# Patient Record
Sex: Female | Born: 1980 | Race: Black or African American | Hispanic: No | Marital: Single | Smoking: Current every day smoker
Health system: Southern US, Community
[De-identification: ages and names within clinical notes are randomized; demographics above are authoritative.]

## PROBLEM LIST (undated history)

## (undated) DIAGNOSIS — O24419 Gestational diabetes mellitus in pregnancy, unspecified control: Secondary | ICD-10-CM

## (undated) DIAGNOSIS — G039 Meningitis, unspecified: Secondary | ICD-10-CM

## (undated) DIAGNOSIS — E669 Obesity, unspecified: Secondary | ICD-10-CM

## (undated) DIAGNOSIS — G43909 Migraine, unspecified, not intractable, without status migrainosus: Secondary | ICD-10-CM

## (undated) DIAGNOSIS — I10 Essential (primary) hypertension: Secondary | ICD-10-CM

## (undated) HISTORY — PX: NO PAST SURGERIES: SHX2092

## (undated) HISTORY — DX: Migraine, unspecified, not intractable, without status migrainosus: G43.909

## (undated) HISTORY — DX: Obesity, unspecified: E66.9

---

## 2001-07-29 ENCOUNTER — Emergency Department (HOSPITAL_COMMUNITY): Admission: EM | Admit: 2001-07-29 | Discharge: 2001-07-29 | Payer: Self-pay

## 2003-05-14 ENCOUNTER — Ambulatory Visit (HOSPITAL_COMMUNITY): Admission: RE | Admit: 2003-05-14 | Discharge: 2003-05-14 | Payer: Self-pay | Admitting: Pulmonary Disease

## 2003-05-28 ENCOUNTER — Emergency Department (HOSPITAL_COMMUNITY): Admission: EM | Admit: 2003-05-28 | Discharge: 2003-05-28 | Payer: Self-pay | Admitting: Emergency Medicine

## 2003-05-31 ENCOUNTER — Inpatient Hospital Stay (HOSPITAL_COMMUNITY): Admission: AD | Admit: 2003-05-31 | Discharge: 2003-06-05 | Payer: Self-pay | Admitting: *Deleted

## 2003-10-28 ENCOUNTER — Emergency Department (HOSPITAL_COMMUNITY): Admission: EM | Admit: 2003-10-28 | Discharge: 2003-10-28 | Payer: Self-pay | Admitting: Family Medicine

## 2004-05-26 ENCOUNTER — Emergency Department (HOSPITAL_COMMUNITY): Admission: EM | Admit: 2004-05-26 | Discharge: 2004-05-26 | Payer: Self-pay | Admitting: Family Medicine

## 2004-09-19 ENCOUNTER — Emergency Department (HOSPITAL_COMMUNITY): Admission: EM | Admit: 2004-09-19 | Discharge: 2004-09-20 | Payer: Self-pay | Admitting: Emergency Medicine

## 2004-09-20 ENCOUNTER — Emergency Department (HOSPITAL_COMMUNITY): Admission: EM | Admit: 2004-09-20 | Discharge: 2004-09-20 | Payer: Self-pay | Admitting: Emergency Medicine

## 2004-10-04 ENCOUNTER — Emergency Department (HOSPITAL_COMMUNITY): Admission: EM | Admit: 2004-10-04 | Discharge: 2004-10-04 | Payer: Self-pay | Admitting: Emergency Medicine

## 2005-02-01 ENCOUNTER — Ambulatory Visit (HOSPITAL_COMMUNITY): Admission: RE | Admit: 2005-02-01 | Discharge: 2005-02-01 | Payer: Self-pay | Admitting: *Deleted

## 2005-03-08 ENCOUNTER — Inpatient Hospital Stay (HOSPITAL_COMMUNITY): Admission: AD | Admit: 2005-03-08 | Discharge: 2005-03-08 | Payer: Self-pay | Admitting: *Deleted

## 2005-03-08 ENCOUNTER — Ambulatory Visit: Payer: Self-pay | Admitting: Family Medicine

## 2005-03-19 ENCOUNTER — Inpatient Hospital Stay (HOSPITAL_COMMUNITY): Admission: AD | Admit: 2005-03-19 | Discharge: 2005-03-20 | Payer: Self-pay | Admitting: *Deleted

## 2005-03-19 ENCOUNTER — Ambulatory Visit: Payer: Self-pay | Admitting: Certified Nurse Midwife

## 2005-06-05 ENCOUNTER — Emergency Department (HOSPITAL_COMMUNITY): Admission: EM | Admit: 2005-06-05 | Discharge: 2005-06-05 | Payer: Self-pay | Admitting: Emergency Medicine

## 2005-06-26 ENCOUNTER — Ambulatory Visit (HOSPITAL_COMMUNITY): Admission: RE | Admit: 2005-06-26 | Discharge: 2005-06-26 | Payer: Self-pay | Admitting: Obstetrics and Gynecology

## 2005-07-09 ENCOUNTER — Inpatient Hospital Stay (HOSPITAL_COMMUNITY): Admission: AD | Admit: 2005-07-09 | Discharge: 2005-07-11 | Payer: Self-pay | Admitting: Obstetrics & Gynecology

## 2005-07-09 ENCOUNTER — Ambulatory Visit: Payer: Self-pay | Admitting: Certified Nurse Midwife

## 2005-09-16 ENCOUNTER — Inpatient Hospital Stay (HOSPITAL_COMMUNITY): Admission: EM | Admit: 2005-09-16 | Discharge: 2005-09-22 | Payer: Self-pay | Admitting: Emergency Medicine

## 2005-09-21 ENCOUNTER — Ambulatory Visit: Payer: Self-pay | Admitting: Infectious Diseases

## 2006-11-04 ENCOUNTER — Emergency Department (HOSPITAL_COMMUNITY): Admission: EM | Admit: 2006-11-04 | Discharge: 2006-11-04 | Payer: Self-pay | Admitting: Emergency Medicine

## 2006-11-20 ENCOUNTER — Emergency Department (HOSPITAL_COMMUNITY): Admission: EM | Admit: 2006-11-20 | Discharge: 2006-11-20 | Payer: Self-pay | Admitting: Emergency Medicine

## 2007-01-07 ENCOUNTER — Emergency Department (HOSPITAL_COMMUNITY): Admission: EM | Admit: 2007-01-07 | Discharge: 2007-01-07 | Payer: Self-pay | Admitting: Emergency Medicine

## 2007-08-20 ENCOUNTER — Emergency Department (HOSPITAL_COMMUNITY): Admission: EM | Admit: 2007-08-20 | Discharge: 2007-08-20 | Payer: Self-pay | Admitting: Emergency Medicine

## 2008-05-02 ENCOUNTER — Emergency Department (HOSPITAL_COMMUNITY): Admission: EM | Admit: 2008-05-02 | Discharge: 2008-05-02 | Payer: Self-pay | Admitting: Emergency Medicine

## 2009-07-16 ENCOUNTER — Emergency Department (HOSPITAL_COMMUNITY): Admission: EM | Admit: 2009-07-16 | Discharge: 2009-07-16 | Payer: Self-pay | Admitting: Emergency Medicine

## 2009-12-29 ENCOUNTER — Emergency Department (HOSPITAL_COMMUNITY)
Admission: EM | Admit: 2009-12-29 | Discharge: 2009-12-29 | Payer: Self-pay | Source: Home / Self Care | Admitting: Emergency Medicine

## 2010-07-06 LAB — URINALYSIS, ROUTINE W REFLEX MICROSCOPIC
Bilirubin Urine: NEGATIVE
Glucose, UA: NEGATIVE mg/dL
Hgb urine dipstick: NEGATIVE
Ketones, ur: NEGATIVE mg/dL
Nitrite: NEGATIVE
Protein, ur: NEGATIVE mg/dL
Specific Gravity, Urine: 1.023 (ref 1.005–1.030)
Urobilinogen, UA: 1 mg/dL (ref 0.0–1.0)
pH: 8.5 — ABNORMAL HIGH (ref 5.0–8.0)

## 2010-07-06 LAB — BASIC METABOLIC PANEL
BUN: 3 mg/dL — ABNORMAL LOW (ref 6–23)
CO2: 26 mEq/L (ref 19–32)
Calcium: 8.6 mg/dL (ref 8.4–10.5)
Chloride: 108 mEq/L (ref 96–112)
Creatinine, Ser: 0.59 mg/dL (ref 0.4–1.2)
GFR calc Af Amer: >60 mL/min (ref >60)
GFR calc non Af Amer: >60 mL/min (ref >60)
Glucose, Bld: 93 mg/dL (ref 70–99)
Potassium: 3.7 mEq/L (ref 3.5–5.1)
Sodium: 137 mEq/L (ref 135–145)

## 2010-07-06 LAB — CBC
HCT: 32.2 % — ABNORMAL LOW (ref 36.0–46.0)
Hemoglobin: 10.4 g/dL — ABNORMAL LOW (ref 12.0–15.0)
MCHC: 32.4 g/dL (ref 30.0–36.0)
MCV: 78.4 fL (ref 78.0–100.0)
Platelets: 329 10*3/uL (ref 150–400)
RBC: 4.1 MIL/uL (ref 3.87–5.11)
RDW: 17 % — ABNORMAL HIGH (ref 11.5–15.5)
WBC: 5.8 10*3/uL (ref 4.0–10.5)

## 2010-07-06 LAB — DIFFERENTIAL
Basophils Absolute: 0 10*3/uL (ref 0.0–0.1)
Basophils Relative: 1 % (ref 0–1)
Eosinophils Absolute: 0.1 10*3/uL (ref 0.0–0.7)
Eosinophils Relative: 2 % (ref 0–5)
Lymphocytes Relative: 30 % (ref 12–46)
Lymphs Abs: 1.7 10*3/uL (ref 0.7–4.0)
Monocytes Absolute: 0.5 10*3/uL (ref 0.1–1.0)
Monocytes Relative: 8 % (ref 3–12)
Neutro Abs: 3.4 10*3/uL (ref 1.7–7.7)
Neutrophils Relative %: 59 % (ref 43–77)

## 2010-07-06 LAB — GC/CHLAMYDIA PROBE AMP, GENITAL
Chlamydia, DNA Probe: NEGATIVE
GC Probe Amp, Genital: NEGATIVE

## 2010-07-06 LAB — WET PREP, GENITAL
Clue Cells Wet Prep HPF POC: NONE SEEN
Trich, Wet Prep: NONE SEEN
Yeast Wet Prep HPF POC: NONE SEEN

## 2010-07-06 LAB — URINE MICROSCOPIC-ADD ON

## 2010-07-06 LAB — PREGNANCY, URINE: Preg Test, Ur: NEGATIVE

## 2010-09-02 NOTE — Discharge Summary (Signed)
NAME:  Sandra Mcknight, Sandra Mcknight             ACCOUNT NO.:  [id number]   MEDICAL RECORD NO.:  [id number]          PATIENT TYPE:  INP   LOCATION:  5017                         FACILITY:  [facility]   PHYSICIAN:  Mina Marble, M.D.DATE OF BIRTH:  [birth year]   DATE OF ADMISSION:  09/16/2005  DATE OF DISCHARGE:  09/22/2005                                 DISCHARGE SUMMARY   DISCHARGE DIAGNOSES:  1.  Herpes simples acute meningitis, treated and improved.  2.  Constipation, nonspecific, improved.  3.  Acute gastritis without hemorrhage with episodes of vomiting, treated,      improved.  4.  Headache, nonspecific, thought to be related to her meningitis.   HISTORY OF PRESENT ILLNESS:  The patient is a 30 year old African-American  female who was admitted from the [facility] emergency department with  episodes of fever with a temperature up to 102 and severe headache and was  found on spinal tap to have an abnormal spinal tap with increased WBCs.  The  patient was admitted for further evaluation and started on Rocephin pending  the results of her cultures.   PAST HISTORY:  Reveals that the patient also had an episode of meningitis of  unclear etiology, possibly one year ago, and was thought to be viral at that  time.  The patient did have blood studies on this occasion to see if her  herpes simplex titer was elevated in which her herpes simplex one and two  titers were elevated.   PHYSICAL EXAMINATION:  VITAL SIGNS: Temperature in the ER of 102, but by the  time of admission was 100.3. Pulse 102.  Respirations 18.  Blood pressure  133/85.  HEENT:  Pupils equal, round and reactive to light.  LUNGS: Clear to auscultation.  HEART: Regular rhythm with no rubs.  ABDOMEN: Slightly obese.  EXTREMITIES: Without edema and good movement.   HOSPITAL COURSE:  The patient's hospital course was one of gradual  improvement.  Her cultures were negative, but the blood studies revealed an  infection with  herpes simplex virus.  An infectious disease consultation was  obtained as to recommendation for the treatment of this infection.  They  recommended that she receive initially IV Acyclovir and after her headache  improved, she was discharged home on Valtrex  1 g p.o. q.i.d. x10 more days  and to have a followup in the office.  The patient was discharged on a  regular diet and Valtrex 1 g p.o. q.i.d. x10 more days.  She was able to  resume her usual activities.      Mina Marble, M.D.  Electronically Signed     GRK/MEDQ  D:  10/17/2005  T:  10/17/2005  Job:  [id number]

## 2010-09-02 NOTE — Discharge Summary (Signed)
NAME:  Sandra Mcknight, Sandra Mcknight                       ACCOUNT NO.:  [id number]   MEDICAL RECORD NO.:  [id number]                   PATIENT TYPE:  INP   LOCATION:  5011                                 FACILITY:  [facility]   PHYSICIAN:  Eino Farber., M.D.      DATE OF BIRTH:  09-15-1980   DATE OF ADMISSION:  05/31/2003  DATE OF DISCHARGE:  06/05/2003                                 DISCHARGE SUMMARY   DISCHARGE DIAGNOSES:  1. Acute viral meningitis, treated by close observation and Rocephin for     possible secondary infection.  2. Myalgias and myositis.  3. Migraine headache, frequent.  4. Acute gastritis without hemorrhage.  5. Constipation, treated and improved.  6. Depression, mild.   BRIEF HISTORY AND PHYSICAL:  The patient was a 30 year old black female who  developed moderately severe headache for about two days prior to admission  and then developed a fever on the day of admission, with stiffness of the  neck and was evaluated at the [facility] Emergency  Department and admitted after finding an elevation in her cerebrospinal  fluid.  The patient relates that she frequently has headaches, but not the  stiffness of the neck.  The patient's cerebrospinal fluid also had some  increased WBCs, but they were lymphocytic.  The patient was started on  Rocephin in the Emergency Department, pending cultures from the  cerebrospinal fluid.   SOCIAL HISTORY:  The patient denies a history of illegal drug use.   FAMILY HISTORY:  Noncontributory.   REVIEW OF SYSTEMS:  Review of systems reveals the head, eyes, ears, nose and  throat, cardiopulmonary, neuromuscular system and GI system, as noted, with  frequent episodes of headache.   ALLERGIES:  The patient has no known drug allergies.   PHYSICAL EXAMINATION:  PERTINENT PHYSICAL FINDINGS 05/31/2003:  Admission  05/31/2003, discharged 06/05/2003.  GENERAL:  The patient was observed in the hospital to be a  well-developed,  well-nourished black female in no acute distress, but complaining of  headache and neck stiffness.  The patient was concerned that she may have  possible rapid neurologic changes, since her headache was worse, and she  felt also weak.  VITAL SIGNS:  Vital signs on admission revealed a temperature of 99.0, pulse  83, respirations 20, and a blood pressure of 101/47.  HEENT:  Examination of the head, eyes, ears, nose and throat revealed her  pupils to be equal, round and reactive to light.  She was alert and coherent  in her conversation.  NECK:  Her neck revealed no masses or thyroid  enlargement.  There was mild  stiffness of the neck with flexion.  LUNGS:  Lungs were clear to auscultation and percussion.  HEART:  The heart had a regular rhythm with no murmurs.  ABDOMEN:  Soft with mild obesity and no abrasions or any organomegaly on  examination of the abdomen.  GU:  Genitalia revealed normal external female genitalia.  EXTREMITIES:  The extremities revealed movement bilaterally and no edema.   HOSPITAL COURSE:  The patient's hospital course was one of gradual  improvement.  She was continued on her Rocephin 2 grams IV daily pending the  results of the cerebrospinal fluid cultures.   Laboratory data, with a CBC on 06/02/2003, revealed a hemoglobin of 11.4 and  indicated slight anemia and a WBC count of 4,200, with a platelet count of  301,000.   The patient continued to improve with headache improved.   DISCHARGE MEDICATIONS:  1. Effexor-XR 150 mg caps one daily which appears to be helping her     headaches, which probably also has a tension component.  2. Darvocet-N 100 one q.4h. p.r.n. pain or headache.  3. Stool softener of choice.  Senokot  as needed, to be continued if     desired.  4. Phenergan 25 mg tabs before meals, as needed for nausea.   DISCHARGE INSTRUCTIONS:  The patient is to ambulate as tolerated.  She is to  continue on a regular diet.  She is  to have a followup evaluation in Dr.  Consuello Bossier office in April 2005.   The patient was seen in consultation by the neurologist, C. Lesia Sago,  M.D., who felt that the patient had viral meningitis that was improving or  aseptic meningitis and chronic headaches, which could be followed as an  outpatient, since her spinal fluid cultures were negative.                                                Eino Farber., M.D.    GRK/MEDQ  D:  07/23/2003  T:  07/24/2003  Job:  [id number]   cc:   Marlan Palau, M.D.  [address]  [address]  [place]  [place] [address]  Fax: [phone]

## 2010-09-02 NOTE — Consult Note (Signed)
NAME:  Sandra Mcknight, Sandra Mcknight                       ACCOUNT NO.:  [id number]   MEDICAL RECORD NO.:  [id number]                   PATIENT TYPE:  INP   LOCATION:  5011                                 FACILITY:  [facility]   PHYSICIAN:  Marlan Palau, M.D.               DATE OF BIRTH:  21-Aug-1980   DATE OF CONSULTATION:  06/02/2002  DATE OF DISCHARGE:                                   CONSULTATION   REFERRING PHYSICIAN:  Dr. Mina Marble.   HISTORY OF PRESENT ILLNESS:  Sandra Mcknight is a 30 year old black  female, born 02/16/1981, with a history of chronic daily headaches  over the last 5 years.  The patient comes into [facility] at this point with  a severe headache, more so than usual, fevers, a stiff neck.  The patient  underwent a lumbar puncture and has evidence of an aseptic meningitis with  greater than 900 white cells, protein in the 80s, normal glucose.  The  patient's cells were almost completely lymphocytes.  Actual CSF protein was  69, glucose 51.  The patient had 950 white cells, 2 red cells.  The patient  has had a CAT scan of the brain that is unremarkable.  The patient reports  symptoms of fever a week prior to this consult evaluation.  The patient has  had some nausea, vomiting; denies diarrhea, skin rash; notes some pain with  cutting the eyes side-to-side.  The patient again does note meningismus.  The patient has been treated with Rocephin IV and is feeling much better at  this point.   PAST MEDICAL HISTORY:  Past medical history is significant for:  1. History of aseptic meningitis.  2. Obesity.  3. Chronic daily headaches.   MEDICATIONS:  The patient has taken some Excedrin for headache prior to the  admission.   ALLERGIES:  Has no known allergies.   HABITS:  Does not smoke or drink.   SOCIAL HISTORY:  The patient lives with her mother, has a 62-year-old  daughter, does not work.   FAMILY MEDICAL HISTORY:  Family medical history was not  obtained.   REVIEW OF SYSTEMS:  Review of systems notable for some fevers, as above,  neck stiffness; denies double vision; denies shortness of breath, chest  pains; has had some nausea; denies any problems controlling the  bowels or  bladder, numbness to face, arms or legs, or blackout episodes.   PHYSICAL EXAMINATION:  VITALS:  Blood pressure is 115/45, heart rate is 83,  respiratory rate 18, temperature 99.5.  GENERAL:  This patient is a moderately obese black female who is alert and  cooperative at time of examination.  HEENT:  Head is atraumatic.  Eyes:  Pupils are equal, round, react to light.  Disks are flat.  No venous pulsations are seen.  NECK:  Neck is relatively supple, no carotid bruits noted.  RESPIRATORY:  Examination is clear.  CARDIOVASCULAR:  Examination reveals a regular rate and rhythm, no obvious  murmurs or rubs noted.  EXTREMITIES:  Extremities are without significant edema.  NEUROLOGIC:  Cranial nerves as above.  Facial symmetry is present.  The  patient has good sensation of the face to pinprick and soft touch  bilaterally, has good speech, no aphasia seen.  Again, extraocular movements  are full.  Visual fields are full.  Motor testing reveals 5/5 strength in  all fours.  Good symmetric motor tone is noted throughout.  Sensory testing  is intact to pinprick, soft touch and vibratory sensation throughout.  The  patient has fair finger-to-nose-to-finger and heel-to-shin.  Gait was not  tested.  Deep tendon reflexes are symmetric and normal.  Toes are neutral  bilaterally.   LABORATORY VALUES:  Laboratory values notable for spinal fluid results as  above.  White count is normal.   CT of the head is unremarkable.   IMPRESSION:  1. Aseptic meningitis, improving.  2. Chronic daily headaches.   This patient has suffered from severe chronic daily headaches for a number  of years.  I will be happy to follow this patient as an outpatient following  discharge.  I  will not initiate treatment until meningitis clears.  The  patient seems to be doing a bit better at this time.  Will follow patient  while in house.   Thank you very much.                                               Marlan Palau, M.D.    CKW/MEDQ  D:  06/03/2003  T:  06/04/2003  Job:  [id number]   cc:   Eino Farber., M.D.  [address] [place]  [place] [address]  Fax: [phone]   [address] [place], [place], [place] [facility]

## 2010-12-01 ENCOUNTER — Emergency Department (HOSPITAL_COMMUNITY): Payer: Medicaid Other

## 2010-12-01 ENCOUNTER — Emergency Department (HOSPITAL_COMMUNITY)
Admission: EM | Admit: 2010-12-01 | Discharge: 2010-12-01 | Disposition: A | Discharge status: Home / Self Care | Payer: Medicaid Other | Attending: Emergency Medicine | Admitting: Emergency Medicine

## 2010-12-01 DIAGNOSIS — M25639 Stiffness of unspecified wrist, not elsewhere classified: Secondary | ICD-10-CM | POA: Insufficient documentation

## 2010-12-01 DIAGNOSIS — M25539 Pain in unspecified wrist: Secondary | ICD-10-CM | POA: Insufficient documentation

## 2011-01-26 LAB — BASIC METABOLIC PANEL
CO2: 25
Calcium: 8.7
Creatinine, Ser: 0.6
GFR calc Af Amer: >60
GFR calc non Af Amer: >60
Glucose, Bld: 83
Sodium: 139

## 2011-01-26 LAB — CBC
MCHC: 31.7
RBC: 4.1
RDW: 17.6 — ABNORMAL HIGH

## 2011-01-26 LAB — DIFFERENTIAL
Basophils Absolute: 0.1
Basophils Relative: 1
Lymphocytes Relative: 8 — ABNORMAL LOW
Neutro Abs: 8.1 — ABNORMAL HIGH
Neutrophils Relative %: 85 — ABNORMAL HIGH

## 2011-01-30 LAB — URINALYSIS, ROUTINE W REFLEX MICROSCOPIC
Bilirubin Urine: NEGATIVE
Glucose, UA: NEGATIVE
Hgb urine dipstick: NEGATIVE
Ketones, ur: NEGATIVE
Nitrite: NEGATIVE
Protein, ur: NEGATIVE
Specific Gravity, Urine: 1.014
Urobilinogen, UA: 0.2
pH: 6.5

## 2011-01-30 LAB — BASIC METABOLIC PANEL WITH GFR
BUN: 3 — ABNORMAL LOW
CO2: 28
Calcium: 8.9
Chloride: 107
Creatinine, Ser: 0.5
GFR calc non Af Amer: >60
Glucose, Bld: 96
Potassium: 3.9
Sodium: 139

## 2011-01-30 LAB — URINE MICROSCOPIC-ADD ON

## 2011-01-30 LAB — D-DIMER, QUANTITATIVE: D-Dimer, Quant: <0.22

## 2011-01-30 LAB — POCT CARDIAC MARKERS
CKMB, poc: <1 — ABNORMAL LOW
Myoglobin, poc: 26.9
Operator id: 1211
Troponin i, poc: <0.05

## 2011-03-31 ENCOUNTER — Inpatient Hospital Stay (HOSPITAL_COMMUNITY)
Admission: EM | Admit: 2011-03-31 | Discharge: 2011-04-03 | DRG: 076 | Disposition: A | Discharge status: Home / Self Care | Payer: Medicaid Other | Source: Ambulatory Visit | Attending: Internal Medicine | Admitting: Internal Medicine

## 2011-03-31 ENCOUNTER — Emergency Department (HOSPITAL_COMMUNITY): Payer: Medicaid Other

## 2011-03-31 ENCOUNTER — Encounter: Payer: Self-pay | Admitting: *Deleted

## 2011-03-31 DIAGNOSIS — D509 Iron deficiency anemia, unspecified: Secondary | ICD-10-CM | POA: Diagnosis present

## 2011-03-31 DIAGNOSIS — R519 Headache, unspecified: Secondary | ICD-10-CM | POA: Diagnosis present

## 2011-03-31 DIAGNOSIS — R110 Nausea: Secondary | ICD-10-CM | POA: Diagnosis present

## 2011-03-31 DIAGNOSIS — M436 Torticollis: Secondary | ICD-10-CM | POA: Diagnosis present

## 2011-03-31 DIAGNOSIS — G039 Meningitis, unspecified: Secondary | ICD-10-CM

## 2011-03-31 DIAGNOSIS — E8881 Metabolic syndrome: Secondary | ICD-10-CM | POA: Diagnosis present

## 2011-03-31 DIAGNOSIS — O26899 Other specified pregnancy related conditions, unspecified trimester: Secondary | ICD-10-CM | POA: Diagnosis present

## 2011-03-31 DIAGNOSIS — R6883 Chills (without fever): Secondary | ICD-10-CM | POA: Diagnosis present

## 2011-03-31 DIAGNOSIS — E669 Obesity, unspecified: Secondary | ICD-10-CM | POA: Diagnosis present

## 2011-03-31 DIAGNOSIS — R509 Fever, unspecified: Secondary | ICD-10-CM | POA: Diagnosis present

## 2011-03-31 DIAGNOSIS — A879 Viral meningitis, unspecified: Principal | ICD-10-CM | POA: Diagnosis present

## 2011-03-31 DIAGNOSIS — R51 Headache: Secondary | ICD-10-CM

## 2011-03-31 DIAGNOSIS — Z833 Family history of diabetes mellitus: Secondary | ICD-10-CM

## 2011-03-31 DIAGNOSIS — E119 Type 2 diabetes mellitus without complications: Secondary | ICD-10-CM | POA: Diagnosis present

## 2011-03-31 DIAGNOSIS — H53149 Visual discomfort, unspecified: Secondary | ICD-10-CM | POA: Diagnosis present

## 2011-03-31 HISTORY — DX: Meningitis, unspecified: G03.9

## 2011-03-31 LAB — DIFFERENTIAL
Basophils Relative: 0 % (ref 0–1)
Eosinophils Relative: 1 % (ref 0–5)
Monocytes Absolute: 0.6 10*3/uL (ref 0.1–1.0)
Monocytes Relative: 7 % (ref 3–12)
Neutro Abs: 6 10*3/uL (ref 1.7–7.7)

## 2011-03-31 LAB — CSF CELL COUNT WITH DIFFERENTIAL
Eosinophils, CSF: 0 % (ref 0–1)
Lymphs, CSF: 96 % — ABNORMAL HIGH (ref 40–80)
Monocyte-Macrophage-Spinal Fluid: 5 % — ABNORMAL LOW (ref 15–45)
RBC Count, CSF: 29 /mm3 — ABNORMAL HIGH
Segmented Neutrophils-CSF: 1 % (ref 0–6)
Tube #: 4

## 2011-03-31 LAB — CBC
HCT: 35.7 % — ABNORMAL LOW (ref 36.0–46.0)
Hemoglobin: 11.3 g/dL — ABNORMAL LOW (ref 12.0–15.0)
MCHC: 31.7 g/dL (ref 30.0–36.0)
MCV: 77.6 fL — ABNORMAL LOW (ref 78.0–100.0)

## 2011-03-31 LAB — BASIC METABOLIC PANEL
BUN: 6 mg/dL (ref 6–23)
CO2: 26 mEq/L (ref 19–32)
Chloride: 103 mEq/L (ref 96–112)
Creatinine, Ser: 0.63 mg/dL (ref 0.50–1.10)
GFR calc Af Amer: >90 mL/min (ref >90)

## 2011-03-31 LAB — PROTEIN AND GLUCOSE, CSF: Glucose, CSF: 51 mg/dL (ref 43–76)

## 2011-03-31 MED ORDER — ONDANSETRON HCL 4 MG/2ML IJ SOLN
INTRAMUSCULAR | Status: AC
  Filled 2011-03-31: qty 2

## 2011-03-31 MED ORDER — HYDROCODONE-ACETAMINOPHEN 5-325 MG PO TABS
1.0000 | ORAL_TABLET | ORAL | Status: DC | PRN
  Administered 2011-04-01 – 2011-04-02 (×7): 2 via ORAL
  Filled 2011-03-31 (×7): qty 2

## 2011-03-31 MED ORDER — CEFTRIAXONE SODIUM 2 G IJ SOLR
INTRAMUSCULAR | Status: AC
  Filled 2011-03-31: qty 2

## 2011-03-31 MED ORDER — ONDANSETRON HCL 4 MG/2ML IJ SOLN
4.0000 mg | Freq: Four times a day (QID) | INTRAMUSCULAR | Status: DC | PRN
  Administered 2011-04-02: 4 mg via INTRAVENOUS
  Filled 2011-03-31: qty 2

## 2011-03-31 MED ORDER — ONDANSETRON HCL 4 MG/2ML IJ SOLN
4.0000 mg | Freq: Once | INTRAMUSCULAR | Status: DC
  Filled 2011-03-31: qty 2

## 2011-03-31 MED ORDER — ACETAMINOPHEN 325 MG PO TABS: 650.0000 mg | ORAL_TABLET | Freq: Four times a day (QID) | ORAL | Status: DC | PRN

## 2011-03-31 MED ORDER — DEXTROSE 5 % IV SOLN
1.0000 g | Freq: Every day | INTRAVENOUS | Status: DC
  Filled 2011-03-31: qty 10

## 2011-03-31 MED ORDER — ONDANSETRON 4 MG PO TBDP
8.0000 mg | ORAL_TABLET | Freq: Once | ORAL | Status: AC
  Administered 2011-03-31: 8 mg via ORAL
  Filled 2011-03-31: qty 2

## 2011-03-31 MED ORDER — ONDANSETRON HCL 4 MG/2ML IJ SOLN
4.0000 mg | Freq: Three times a day (TID) | INTRAMUSCULAR | Status: DC | PRN
  Administered 2011-03-31: 4 mg via INTRAVENOUS

## 2011-03-31 MED ORDER — ONDANSETRON HCL 4 MG PO TABS
4.0000 mg | ORAL_TABLET | Freq: Four times a day (QID) | ORAL | Status: DC | PRN
  Administered 2011-04-01 – 2011-04-02 (×2): 4 mg via ORAL
  Filled 2011-03-31 (×2): qty 1

## 2011-03-31 MED ORDER — MORPHINE SULFATE 2 MG/ML IJ SOLN
1.0000 mg | INTRAMUSCULAR | Status: DC | PRN
  Administered 2011-04-01 (×3): 1 mg via INTRAVENOUS
  Filled 2011-03-31 (×3): qty 1

## 2011-03-31 MED ORDER — DEXTROSE 5 % IV SOLN
INTRAVENOUS | Status: AC
  Filled 2011-03-31: qty 50

## 2011-03-31 MED ORDER — VANCOMYCIN HCL IN DEXTROSE 1-5 GM/200ML-% IV SOLN
1000.0000 mg | Freq: Once | INTRAVENOUS | Status: AC
  Administered 2011-03-31: 1000 mg via INTRAVENOUS
  Filled 2011-03-31: qty 200

## 2011-03-31 MED ORDER — DEXTROSE 5 % IV SOLN
2.0000 g | INTRAVENOUS | Status: AC
  Administered 2011-03-31: 2 g via INTRAVENOUS
  Filled 2011-03-31: qty 2

## 2011-03-31 MED ORDER — HYDROMORPHONE HCL PF 1 MG/ML IJ SOLN
1.0000 mg | Freq: Once | INTRAMUSCULAR | Status: AC
  Administered 2011-03-31: 1 mg via INTRAMUSCULAR
  Filled 2011-03-31: qty 1

## 2011-03-31 MED ORDER — DEXTROSE 5 % IV SOLN
500.0000 mg | Freq: Three times a day (TID) | INTRAVENOUS | Status: DC
  Administered 2011-04-01 – 2011-04-03 (×8): 500 mg via INTRAVENOUS
  Filled 2011-03-31 (×10): qty 10

## 2011-03-31 MED ORDER — VANCOMYCIN HCL IN DEXTROSE 1-5 GM/200ML-% IV SOLN
1000.0000 mg | Freq: Three times a day (TID) | INTRAVENOUS | Status: DC
  Administered 2011-04-01 (×2): 1000 mg via INTRAVENOUS
  Filled 2011-03-31 (×4): qty 200

## 2011-03-31 MED ORDER — ALUM & MAG HYDROXIDE-SIMETH 200-200-20 MG/5ML PO SUSP: 30.0000 mL | Freq: Four times a day (QID) | ORAL | Status: DC | PRN

## 2011-03-31 MED ORDER — HYDROMORPHONE HCL PF 1 MG/ML IJ SOLN
1.0000 mg | Freq: Once | INTRAMUSCULAR | Status: AC
  Administered 2011-03-31: 1 mg via INTRAMUSCULAR

## 2011-03-31 MED ORDER — LIDOCAINE HCL 2 % IJ SOLN
INTRAMUSCULAR | Status: AC
  Administered 2011-03-31: 14:00:00
  Filled 2011-03-31: qty 1

## 2011-03-31 MED ORDER — THERA M PLUS PO TABS
1.0000 | ORAL_TABLET | Freq: Every day | ORAL | Status: DC
  Administered 2011-04-01 – 2011-04-03 (×3): 1 via ORAL
  Filled 2011-03-31 (×3): qty 1

## 2011-03-31 MED ORDER — HYDROMORPHONE HCL PF 1 MG/ML IJ SOLN
1.0000 mg | Freq: Once | INTRAMUSCULAR | Status: DC
  Filled 2011-03-31: qty 1

## 2011-03-31 MED ORDER — ACETAMINOPHEN 650 MG RE SUPP: 650.0000 mg | Freq: Four times a day (QID) | RECTAL | Status: DC | PRN

## 2011-03-31 MED ORDER — SODIUM CHLORIDE 0.9 % IV SOLN
INTRAVENOUS | Status: DC
  Administered 2011-04-01: 75 mL/h via INTRAVENOUS
  Administered 2011-04-02: 14:00:00 via INTRAVENOUS

## 2011-03-31 NOTE — ED Notes (Signed)
IV team unsuccessful at IV insertion. Dr. Lynelle Doctor notified and changed route of medication.

## 2011-03-31 NOTE — H&P (Signed)
PCP:   No primary provider on file.   Chief Complaint:  Headaches, neck stiffness, nausea and general malaise  HPI: 30 year old female with a past medical history significant for obesity, first degree relative with DM and previews admission to the hospital about 2 years ago due to viral meningitis; who came into the hospital secondary to headaches, neck stiffness, and generalized malaise. In the ED patient was found to be low grade fever at 99.9 and an LP performed by IR demonstrated elevated protein and more than 300 WBC's. CT of the head was negative for any abnormalities. [facility] internal medicine has been called to admit this patient for further evaluation and treatment of what appears to be meningitis (viral).  Allergies:  No Known Allergies    Past Medical History  Diagnosis Date  . Meningitis     History reviewed. No pertinent past surgical history.  Prior to Admission medications   Medication Sig Start Date End Date Taking? Authorizing Provider  ibuprofen (ADVIL,MOTRIN) 200 MG tablet Take 400 mg by mouth every 8 (eight) hours as needed. For headaches.    Yes Historical Provider, MD    Social History:  does not have a smoking history on file. She does not have any smokeless tobacco history on file. She reports that she does not drink alcohol or use illicit drugs.  History reviewed. No pertinent family history.  Review of Systems:  Complete review of systems has been done and is negative except as mentioned on history of present illness.  Physical Exam: Blood pressure 120/71, pulse 105, temperature 98.6 F (37 C), temperature source Oral, resp. rate 16, SpO2 100.00%. Constitutional: Alert, awake and oriented x3. Obese, no acute distress well hydrated.  HEENT:  Head: Normocephalic and atraumatic.  Ears: Bilaterally with no erythema or bulging of tympanic membranes. No drainage Eyes: Conjunctivae are normal. No icterus, no nystagmus, PERRLA, extraocular muscles  intact. Neck: Neck supple. No thyromegaly, no bruits.  decreased range of motion and pain with movements.  Cardiovascular: Normal rate, regular rhythm and intact distal pulses.  Pulmonary/Chest: Effort normal and breath sounds normal. No stridor. No respiratory distress. She has no wheezes. She has no rales.  Abdominal: Soft. Bowel sounds are normal. She exhibits no distension. There is no tenderness.  Extremities:no edema and no tenderness. No cyanosis or clubbing.  Neurological: AAO x3; Cranial nerve  2-12 grossly intact; She displays no seizure activity. Coordination  is normal.  no focal motor deficit. Muscle strength 4/5 bilaterally symmetrically secondary to poor effort. Skin: Skin is warm and dry. No rash noted.    Labs on Admission:  Results for orders placed during the hospital encounter of 03/31/11 (from the past 48 hour(s))  CBC     Status: Abnormal   Collection Time   03/31/11  1:38 PM      Component Value Range Comment   WBC 8.7  4.0 - 10.5 (K/uL)    RBC 4.60  3.87 - 5.11 (MIL/uL)    Hemoglobin 11.3 (*) 12.0 - 15.0 (g/dL)    HCT 11.9 (*) 14.7 - 46.0 (%)    MCV 77.6 (*) 78.0 - 100.0 (fL)    MCH 24.6 (*) 26.0 - 34.0 (pg)    MCHC 31.7  30.0 - 36.0 (g/dL)    RDW 82.9 (*) 56.2 - 15.5 (%)    Platelets 342  150 - 400 (K/uL)   DIFFERENTIAL     Status: Normal   Collection Time   03/31/11  1:38 PM  Component Value Range Comment   Neutrophils Relative 70  43 - 77 (%)    Neutro Abs 6.0  1.7 - 7.7 (K/uL)    Lymphocytes Relative 22  12 - 46 (%)    Lymphs Abs 1.9  0.7 - 4.0 (K/uL)    Monocytes Relative 7  3 - 12 (%)    Monocytes Absolute 0.6  0.1 - 1.0 (K/uL)    Eosinophils Relative 1  0 - 5 (%)    Eosinophils Absolute 0.1  0.0 - 0.7 (K/uL)    Basophils Relative 0  0 - 1 (%)    Basophils Absolute 0.0  0.0 - 0.1 (K/uL)   BASIC METABOLIC PANEL     Status: Normal   Collection Time   03/31/11  1:38 PM      Component Value Range Comment   Sodium 139  135 - 145 (mEq/L)     Potassium 3.8  3.5 - 5.1 (mEq/L)    Chloride 103  96 - 112 (mEq/L)    CO2 26  19 - 32 (mEq/L)    Glucose, Bld 86  70 - 99 (mg/dL)    BUN 6  6 - 23 (mg/dL)    Creatinine, Ser 0.45  0.50 - 1.10 (mg/dL)    Calcium 9.5  8.4 - 10.5 (mg/dL)    GFR calc non Af Amer >90  >90 (mL/min)    GFR calc Af Amer >90  >90 (mL/min)   CSF CELL COUNT WITH DIFFERENTIAL     Status: Abnormal   Collection Time   03/31/11  6:30 PM      Component Value Range Comment   Tube # 1      Color, CSF COLORLESS  COLORLESS     Appearance, CSF CLEAR  CLEAR     Supernatant NOT INDICATED      RBC Count, CSF 29 (*) 0 (/cu mm)    WBC, CSF 300 (*) 0 - 5 (/cu mm)    Segmented Neutrophils-CSF 1  0 - 6 (%)    Lymphs, CSF 96 (*) 40 - 80 (%)    Monocyte-Macrophage-Spinal Fluid 3 (*) 15 - 45 (%)    Eosinophils, CSF 0  0 - 1 (%)   PROTEIN AND GLUCOSE, CSF     Status: Abnormal   Collection Time   03/31/11  6:30 PM      Component Value Range Comment   Glucose, CSF 51  43 - 76 (mg/dL)    Total  Protein, CSF 66 (*) 15 - 45 (mg/dL)   CSF CELL COUNT WITH DIFFERENTIAL     Status: Abnormal   Collection Time   03/31/11  6:30 PM      Component Value Range Comment   Tube # 4      Color, CSF COLORLESS  COLORLESS     Appearance, CSF CLEAR  CLEAR     Supernatant NOT INDICATED      RBC Count, CSF 11 (*) 0 (/cu mm)    WBC, CSF 300 (*) 0 - 5 (/cu mm)    Segmented Neutrophils-CSF 1  0 - 6 (%)    Lymphs, CSF 94 (*) 40 - 80 (%)    Monocyte-Macrophage-Spinal Fluid 5 (*) 15 - 45 (%)    Eosinophils, CSF 0  0 - 1 (%)     Radiological Exams on Admission: Ct Head Wo Contrast  03/31/2011  *RADIOLOGY REPORT*  Clinical Data: Headache and neck pain.  CT HEAD WITHOUT CONTRAST  Technique:  Contiguous axial  images were obtained from the base of the skull through the vertex without contrast.  Comparison: Head CT scan 08/20/2007.  Findings: The brain appears normal without evidence of acute infarction, hemorrhage, mass lesion, mass effect, midline  shift or abnormal extra-axial fluid collection.  No hydrocephalus or pneumocephalus.  Calvarium is intact.  Imaged paranasal sinuses and mastoid air cells are clear.  IMPRESSION: Normal study.  Original Report Authenticated By: Bernadene Bell. D'ALESSIO, M.D.   Dg Fluoro Guide Ndl Plc/bx  03/31/2011  *RADIOLOGY REPORT*  Clinical Data:  Headache.  Rule out meningitis  DIAGNOSTIC LUMBAR PUNCTURE UNDER FLUOROSCOPIC GUIDANCE  Fluoroscopy time:  Three minutes.  Technique:  Informed consent was obtained from the patient prior to the procedure, including potential complications of headache, allergy, and pain.   With the patient prone, the lower back was prepped with Betadine.  1% Lidocaine was used for local anesthesia. Lumbar puncture was performed at the L3-4 level using a 20 gauge needle with return of blood tinged followed by clearing CSF with an opening pressure of 21 cm water.   9 ml of CSF were obtained for laboratory studies.  The patient tolerated the procedure well and there were no apparent complications.  IMPRESSION: Successful lumbar puncture  Original Report Authenticated By: Camelia Phenes, M.D.     Assessment/Plan 1-Headache and Stiffness of neck: Most likely secondary to meningitis. At this point appears to be due to viral meningitis but there is more than 300 WBC's on lumbar puncture; after discussing with the patient disease (Dr. Ilsa Iha) the recommendation is for empirical treatment with vancomycin, Rocephin and Acyclovir. Will also provide supportive care, patient will be admitted with droplet isolation. Further treatment based on CSF cultures and ID recommendations.  2-Obesity: Patient was advised to follow a low calorie diet.  3-Chills: Temperature of 99.9 most likely associated to problem #1. Continue antibiotics and antivirals. Will use antipyretics as needed.  4-Nausea:antiemetics as needed.  5-DVT: SCDs.  6-First degree relative with diabetes: Will check A1c and fasting CBG in  am.   Time Spent on Admission: 55 minutes  Ailsa Mireles [facility] [phone]  03/31/2011, 10:00 PM

## 2011-03-31 NOTE — ED Provider Notes (Signed)
History     CSN: [id number] Arrival date & time: 03/31/2011 11:55 AM   First MD Initiated Contact with Patient 03/31/11 1207      No chief complaint on file.   (Consider location/radiation/quality/duration/timing/severity/associated sxs/prior treatment) HPI Patient's had a headache for the last couple of days. It is sharp in the frontal areas and increases when she is lying down. Patient has history of meningitis and her symptoms feel similar to that. She does have some pain in her neck as well. There's been no fevers. No cough no vomiting no diarrhea. Patient has tried some Advil without relief. Patient states she's not sure why she has gotten a viral meningitis and they were not able to tell her why this occurred. Past Medical History  Diagnosis Date  . Meningitis     History reviewed. No pertinent past surgical history.  History reviewed. No pertinent family history.  History  Substance Use Topics  . Smoking status: Not on file  . Smokeless tobacco: Not on file  . Alcohol Use: No    OB History    Grav Para Term Preterm Abortions TAB SAB Ect Mult Living                  Review of Systems  All other systems reviewed and are negative.    Allergies  Review of patient's allergies indicates no known allergies.  Home Medications   Current Outpatient Rx  Name Route Sig Dispense Refill  . IBUPROFEN 200 MG PO TABS Oral Take 400 mg by mouth every 8 (eight) hours as needed. For headaches.       BP 122/62  Pulse 100  Temp(Src) 99.4 F (37.4 C) (Oral)  Resp 16  SpO2 100%  Physical Exam  Nursing note and vitals reviewed. Constitutional: No distress.       obese  HENT:  Head: Normocephalic and atraumatic.  Right Ear: External ear normal.  Left Ear: External ear normal.  Eyes: Conjunctivae are normal. Right eye exhibits no discharge. Left eye exhibits no discharge. No scleral icterus.  Neck: Neck supple. No tracheal deviation present.       Pain with range of  motion  Cardiovascular: Normal rate, regular rhythm and intact distal pulses.   Pulmonary/Chest: Effort normal and breath sounds normal. No stridor. No respiratory distress. She has no wheezes. She has no rales.  Abdominal: Soft. Bowel sounds are normal. She exhibits no distension. There is no tenderness. There is no rebound and no guarding.  Musculoskeletal: She exhibits no edema and no tenderness.  Neurological: She is alert. She has normal strength. No sensory deficit. Cranial nerve deficit:  no gross defecits noted. She exhibits normal muscle tone. She displays no seizure activity. Coordination normal.  Skin: Skin is warm and dry. No rash noted.  Psychiatric: She has a normal mood and affect.    ED Course  LUMBAR PUNCTURE Performed by: Linwood Dibbles R Authorized by: Linwood Dibbles R Consent: Verbal consent obtained. Risks and benefits: risks, benefits and alternatives were discussed Consent given by: patient Patient identity confirmed: verbally with patient Indications: evaluation for infection Anesthesia: local infiltration Local anesthetic: lidocaine 2% without epinephrine Anesthetic total: 10 ml Patient sedated: no Preparation: Patient was prepped and draped in the usual sterile fashion. Lumbar space: L4-L5 interspace Patient's position: sitting Needle gauge: 22 Needle length: 3.5 in Number of attempts: 1 Comments: unsuccessful   (including critical care time)  Nurses and IV team unable to get IV access.  Will give IM pain  meds. Attempted LP.  Unsuccessful attempt. Labs Reviewed  CBC - Abnormal; Notable for the following:    Hemoglobin 11.3 (*)    HCT 35.7 (*)    MCV 77.6 (*)    MCH 24.6 (*)    RDW 16.1 (*)    All other components within normal limits  DIFFERENTIAL  BASIC METABOLIC PANEL  CSF CELL COUNT WITH DIFFERENTIAL  CSF CULTURE  PROTEIN AND GLUCOSE, CSF   Ct Head Wo Contrast  03/31/2011  *RADIOLOGY REPORT*  Clinical Data: Headache and neck pain.  CT HEAD  WITHOUT CONTRAST  Technique:  Contiguous axial images were obtained from the base of the skull through the vertex without contrast.  Comparison: Head CT scan 08/20/2007.  Findings: The brain appears normal without evidence of acute infarction, hemorrhage, mass lesion, mass effect, midline shift or abnormal extra-axial fluid collection.  No hydrocephalus or pneumocephalus.  Calvarium is intact.  Imaged paranasal sinuses and mastoid air cells are clear.  IMPRESSION: Normal study.  Original Report Authenticated By: Bernadene Bell. Maricela Curet, M.D.     No diagnosis found.    MDM  Pt given IM pain meds.  AWaiting LP results.  Hx of viral meningitis.  Will turn over to Dr Juleen China pending the LP results        Celene Kras, MD 03/31/11 [phone]

## 2011-03-31 NOTE — ED Notes (Addendum)
Dr. Lynelle Doctor and Maralyn Sago, RN at bedside performing lumbar puncture.

## 2011-03-31 NOTE — ED Notes (Signed)
Iv team unable to get IV access. Dr. Lynelle Doctor notified.

## 2011-03-31 NOTE — ED Notes (Signed)
Attempted IV insertion.  Unsuccessful, another RN looked without luck.

## 2011-03-31 NOTE — ED Notes (Signed)
Pt resting quietly at the time. States 7/10 headache and neck pain. Droplet precautions remain, pt has mask on. Up to bathroom without difficulty. She remains alert and oriented x4. Vital signs stable. No signs of distress at the present.

## 2011-03-31 NOTE — ED Provider Notes (Signed)
Procedure:  Ultrasound-guided placement of peripheral IV Indication: Need for IV access for IV antibiotics Ultrasound was used for real-time guidance for placement of a 20-gauge Angiocath and right upper extremity. Angiocath visualized entering vein. Dark nonpulsatile blood return. Easy return in flushing. Interpretation: Successful placement of peripheral IV  Dr. Juleen China available for procedure.  Sandra Mcknight 03/31/11 2127

## 2011-03-31 NOTE — ED Notes (Signed)
Pt reporting hx of mengyitis x 2. Pt reports she is feeling now like she felt before when she was dx. C/o headache, neck pain, pain in legs and back x 2 days.

## 2011-03-31 NOTE — ED Notes (Signed)
Pt returned from radiology. LP successful. Pt lying flat at the time. Vital signs stable. 5/10 headache at the time. No signs of distress at present. Will continue to monitor. Pt updated on plan of care.

## 2011-03-31 NOTE — ED Notes (Signed)
Assumed care of pt.  No distress noted.  Pt resting, states that the pain medication decreased her HA but reports pain with movement.  Pt continues to lay flat.  Lights turned down for patient comfort.  Skin warm, dry and intact.  Neuro intact.

## 2011-03-31 NOTE — ED Notes (Signed)
Pt denies having steroid injection.

## 2011-03-31 NOTE — Progress Notes (Signed)
ANTIBIOTIC CONSULT NOTE - INITIAL  Pharmacy Consult for vancomycin Indication: possible meningitis  No Known Allergies  Patient Measurements: Height: 5\' 2"  (157.5 cm) Weight: 218 lb (98.884 kg) IBW/kg (Calculated) : 50.1   Vital Signs: Temp: 101.9 F (38.8 C) (12/14 2248) Temp src: Oral (12/14 2248) BP: 122/75 mmHg (12/14 2248) Pulse Rate: 101  (12/14 2248) Intake/Output from previous day:   Intake/Output from this shift:    Labs:  [facility] 03/31/11 1338  WBC 8.7  HGB 11.3*  PLT 342  LABCREA --  CREATININE 0.63   Estimated Creatinine Clearance: 113 ml/min (by C-G formula based on Cr of 0.63). No results found for this basename: VANCOTROUGH:2,VANCOPEAK:2,VANCORANDOM:2,GENTTROUGH:2,GENTPEAK:2,GENTRANDOM:2,TOBRATROUGH:2,TOBRAPEAK:2,TOBRARND:2,AMIKACINPEAK:2,AMIKACINTROU:2,AMIKACIN:2, in the last 72 hours   Microbiology: No results found for this or any previous visit (from the past 720 hour(s)).  Medical History: Past Medical History  Diagnosis Date  . Meningitis     Medications:  Prescriptions prior to admission  Medication Sig Dispense Refill  . ibuprofen (ADVIL,MOTRIN) 200 MG tablet Take 400 mg by mouth every 8 (eight) hours as needed. For headaches.        Assessment: 30 yof with significant history of meningitis presented to the ED with a stiff neck and malaise. Starting empiric vancomycin + ceftriaxone + acyclovir.   Goal of Therapy:  Vancomycin trough level 15-20 mcg/ml  Plan:  Measure antibiotic drug levels at steady state Follow up culture results Vancomycin 1gm IV Q8H  Elaysia Devargas, Drake Leach 03/31/2011,11:30 PM

## 2011-03-31 NOTE — ED Notes (Signed)
Pt resting quietly at the time. States 6/10 headache and neck pain. Vital signs stable. Pt given drink. No signs of distress at the time. Droplet precautions remain.

## 2011-03-31 NOTE — ED Notes (Signed)
Consent obtained for lumbar puncture

## 2011-03-31 NOTE — ED Provider Notes (Signed)
Discussed LP results with pt. CSF significant for 300 WBC and increased protein. Clinically well appearing and suspect viral meningitis, but covered for possible bacterial. Discussed with hospitalist concerning viral coverage and wants to defer at this time until consults ID. Pt with no IV access upon return of LP results. Dr. Zebedee Iba placed 20g in arm with US guidance under my supervision.  Raeford Razor, MD 04/02/11 [phone]

## 2011-03-31 NOTE — ED Notes (Signed)
X 2 frontal h/a' that inc.when laying down, sharp frontal; hx. Of migraines, and meningitis - believes the sx. Could be menigitis. Pain in neck, shoulders; no stiff neck, no fevers.

## 2011-03-31 NOTE — ED Provider Notes (Signed)
History     CSN: [id number] Arrival date & time: 03/31/2011 11:55 AM   First MD Initiated Contact with Patient 03/31/11 1207      No chief complaint on file.   (Consider location/radiation/quality/duration/timing/severity/associated sxs/prior treatment) HPI  Past Medical History  Diagnosis Date  . Meningitis     History reviewed. No pertinent past surgical history.  History reviewed. No pertinent family history.  History  Substance Use Topics  . Smoking status: Not on file  . Smokeless tobacco: Not on file  . Alcohol Use: No    OB History    Grav Para Term Preterm Abortions TAB SAB Ect Mult Living                  Review of Systems  Allergies  Review of patient's allergies indicates no known allergies.  Home Medications   Current Outpatient Rx  Name Route Sig Dispense Refill  . IBUPROFEN 200 MG PO TABS Oral Take 400 mg by mouth every 8 (eight) hours as needed. For headaches.       BP 117/68  Pulse 99  Temp(Src) 97.8 F (36.6 C) (Oral)  Resp 18  SpO2 100%  Physical Exam  ED Course  Procedures (including critical care time)  Labs Reviewed - No data to display No results found.   No diagnosis found.    MDM  Patient presents with headache and neck stiffness that she states is similar to other time she's had meningitis.  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  Patient is afebrile here but given her symptoms consistent with some past presentations I believe she warrants further evaluation.        Nat Christen, MD 03/31/11 [phone]

## 2011-03-31 NOTE — ED Notes (Signed)
Radiology called regarding LP. Pt to be transported for procedure soon.

## 2011-03-31 NOTE — ED Notes (Signed)
IV team notified for need of IV stick.

## 2011-03-31 NOTE — ED Notes (Signed)
Pt resting quietly. States she feels a little better after receiving pain medication. She remains alert and oriented x4. Vital signs stable. No signs of distress at the time.

## 2011-04-01 DIAGNOSIS — A879 Viral meningitis, unspecified: Secondary | ICD-10-CM

## 2011-04-01 LAB — BASIC METABOLIC PANEL
BUN: 5 mg/dL — ABNORMAL LOW (ref 6–23)
CO2: 25 mEq/L (ref 19–32)
GFR calc non Af Amer: >90 mL/min (ref >90)
Glucose, Bld: 88 mg/dL (ref 70–99)
Potassium: 3.7 mEq/L (ref 3.5–5.1)

## 2011-04-01 LAB — HEPATIC FUNCTION PANEL
ALT: 12 U/L (ref 0–35)
Alkaline Phosphatase: 45 U/L (ref 39–117)
Bilirubin, Direct: <0.1 mg/dL (ref 0.0–0.3)

## 2011-04-01 LAB — CBC
HCT: 32.8 % — ABNORMAL LOW (ref 36.0–46.0)
Hemoglobin: 10.5 g/dL — ABNORMAL LOW (ref 12.0–15.0)
MCHC: 32 g/dL (ref 30.0–36.0)
RBC: 4.22 MIL/uL (ref 3.87–5.11)

## 2011-04-01 LAB — HIV ANTIBODY (ROUTINE TESTING W REFLEX): HIV: NONREACTIVE

## 2011-04-01 LAB — HEMOGLOBIN A1C: Mean Plasma Glucose: 140 mg/dL — ABNORMAL HIGH (ref <117)

## 2011-04-01 MED ORDER — DEXTROSE 5 % IV SOLN
2.0000 g | Freq: Two times a day (BID) | INTRAVENOUS | Status: DC
  Filled 2011-04-01 (×2): qty 2

## 2011-04-01 NOTE — Consult Note (Addendum)
INFECTIOUS DISEASES INITIAL CONSULTATION  Reason for Consult: recurrent aseptic meningitis Referring Physician: [facility]  Sandra Mcknight is an 30 y.o. female.  HPI: no significant prior history other than having recurrent aseptic meningitis in 2005, 2008. She denies history of cold sores. The patient presented to ED with 3 day history of headache, and neck pain. Her headache is associated with photophobia, lightheadedness, N/V. Her neck pain is also noted in lateral motion. She became increasing uncomfortable and came to ED for evaluation given her prior history of aseptic meningitis. She only took ibuprofen 2 tabs the night prior coming to ED with little improvement. The patient denies fever, nor having any cold sores. She denies having a history of any genital herpes. She states that she has family staying with her who are ill, including a cousin with cold sores.  Since being admitted and receiving IV fluids, IV antibiotics, and analgesics she is feeling slightly better. Still not back to her usual self.  Home medications: none, ibuprofen PRN Allergies: No Known Allergies Abtx: Acyclovir Ceftriaxone Vancomycin    . acyclovir  500 mg Intravenous Q8H  . cefTRIAXone (ROCEPHIN)  IV  2 g Intravenous To Major  . cefTRIAXone (ROCEPHIN)  IV  2 g Intravenous Q12H  . cefTRIAXone      . dextrose      .  HYDROmorphone (DILAUDID) injection  1 mg Intramuscular Once  . multivitamins ther. w/minerals  1 tablet Oral Daily  . vancomycin  1,000 mg Intravenous Once  . vancomycin  1,000 mg Intravenous Q8H  . DISCONTD: cefTRIAXone (ROCEPHIN)  IV  1 g Intravenous QHS     Past Medical History  Diagnosis Date  . Meningitis     History reviewed. No pertinent past surgical history.   Social History:  does not have a smoking history on file. She does not have any smokeless tobacco history on file. She reports that she does not drink alcohol or use illicit drugs. 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 She has not received her flu vax  Family hx: DM, HTN  ROSper hpi. Otherwise, 12 point review of systems is non-contributory. Blood pressure 112/48, pulse 92, temperature 98.5 F (36.9 C), temperature source Oral, resp. rate 18, height 5\' 2"  (1.575 m), weight 218 lb (98.884 kg), SpO2 98.00%. Physical Exam BP 112/48  Pulse 92  Temp(Src) 98.5 F (36.9 C) (Oral)  Resp 18  Ht 5\' 2"  (1.575 m)  Wt 218 lb (98.884 kg)  BMI 39.87 kg/m2  SpO2 98%  General Appearance:    Alert, cooperative, no distress, appears stated age  Head:    Normocephalic, without obvious abnormality, atraumatic  Eyes:    PERRL, conjunctiva/corneas clear, EOM's intact, fundi    benign, both eyes  Ears:    Normal TM's and external ear canals, both ears  Nose:   Nares normal, septum midline, mucosa normal, no drainage    or sinus tenderness  Throat:   Lips, mucosa, and tongue normal; teeth and gums normal  Neck:   Supple, symmetrical, trachea midline, no adenopathy;    thyroid:  no enlargement/tenderness/nodules; no carotid   bruit or JVD; no nuchal rigidity     Lungs:     Clear to auscultation bilaterally, respirations unlabored      Heart:    Regular rate and rhythm, S1 and S2 normal, no murmur, rub   or gallop     Abdomen:  Soft, non-tender, bowel sounds active all four quadrants,    no masses, no organomegaly        Extremities:   Extremities normal, atraumatic, no cyanosis or edema  Pulses:   2+ and symmetric all extremities  Skin:   Skin color, texture, turgor normal, no rashes or lesions  Lymph nodes:   Cervical, supraclavicular, and axillary nodes normal  Neurologic:   CNII-XII intact, normal strength, sensation and reflexes    throughout   Labs: CBC    Component Value Date/Time   WBC 6.9 04/01/2011 0910   RBC 4.22 04/01/2011 0910   HGB 10.5* 04/01/2011 0910   HCT 32.8* 04/01/2011 0910   PLT 275 04/01/2011 0910   MCV 77.7* 04/01/2011 0910   MCH 24.9*  04/01/2011 0910   MCHC 32.0 04/01/2011 0910   RDW 16.2* 04/01/2011 0910   LYMPHSABS 1.9 03/31/2011 1338   MONOABS 0.6 03/31/2011 1338   EOSABS 0.1 03/31/2011 1338   BASOSABS 0.0 03/31/2011 1338   CSF:  WBC 300, 99% lymph/ TP 66/ Glu 51/serum glu 86  MICRO: Gram stain negative for bacteria, only mononuclear cells. NGTD CMP     Component Value Date/Time   NA 136 04/01/2011 0910   K 3.7 04/01/2011 0910   CL 103 04/01/2011 0910   CO2 25 04/01/2011 0910   GLUCOSE 88 04/01/2011 0910   BUN 5* 04/01/2011 0910   CREATININE 0.62 04/01/2011 0910   CALCIUM 8.7 04/01/2011 0910   PROT 7.2 03/31/2011 2358   ALBUMIN 3.3* 03/31/2011 2358   AST 20 03/31/2011 2358   ALT 12 03/31/2011 2358   ALKPHOS 45 03/31/2011 2358   BILITOT 0.3 03/31/2011 2358   GFRNONAA >90 04/01/2011 0910   GFRAA >90 04/01/2011 0910    Imaging: NCHCT: The brain appears normal without evidence of acute infarction, hemorrhage, mass lesion, mass effect, midline shift or abnormal extra-axial fluid collection.  No hydrocephalus or pneumocephalus.  Calvarium is intact.  Imaged paranasal sinuses and mastoid air cells are clear.  IMPRESSION: Normal study.  Assessment/Plan: 30yo Female with recurrent aseptic meningitis, presents with HA/Nuchal rigidity. Repeat LP shows a monocytic pleocytosis with total WBC 300, protein<80 and normal glucose. No organisms on gram stain. She is not taking any regular medications that could cause a drug induced aseptic meningitis. She does subscribe to sick contact who has cold sores.  This is likely viral meningitis, ? Enteroviruses vs. Possible molleret's syndrome.  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 Await HSV PCR. Can change to orals if patient is improved tomorrow.  2) can discontinue vancomycin and ceftriaxone since unlikely to be bacterial process  3) will add on enterovirus pcr panel to CSF  4) we will check HSV ab in serum  5) continue to treat with analgesics.  6)  health maintenance: please give flu vaccine upon discharge  7) droplet isolation can be discontinued. This is a aseptic meningitis case, no need for droplets precautions  It is a pleasure to see Sandra Mcknight.  Sandra Mcknight 04/01/2011, 3:48 PM   Pager [phone]

## 2011-04-01 NOTE — Progress Notes (Signed)
DAILY PROGRESS NOTE                              GENERAL INTERNAL MEDICINE [facility]  SUBJECTIVE: Patient is still complaining about some aches and pains especially in her back.  OBJECTIVE: BP 112/48  Pulse 92  Temp(Src) 98.5 F (36.9 C) (Oral)  Resp 18  Ht 5\' 2"  (1.575 m)  Wt 98.884 kg (218 lb)  BMI 39.87 kg/m2  SpO2 98%  Intake/Output Summary (Last 24 hours) at 04/01/11 1438 Last data filed at 04/01/11 1300  Gross per 24 hour  Intake    350 ml  Output      2 ml  Net    348 ml                      Weight change:  Physical Exam: General: Alert and awake oriented x3 not in any acute distress. HEENT: anicteric sclera, pupils equal reactive to light and accommodation CVS: S1-S2 heard, no murmur rubs or gallops Chest: clear to auscultation bilaterally, no wheezing rales or rhonchi Abdomen:  normal bowel sounds, soft, nontender, nondistended, no organomegaly Neuro: Cranial nerves II-XII intact, no focal neurological deficits Extremities: no cyanosis, no clubbing or edema noted bilaterally   Lab Results:  Basename 04/01/11 0910 03/31/11 2358 03/31/11 1338  NA 136 -- 139  K 3.7 -- 3.8  CL 103 -- 103  CO2 25 -- 26  GLUCOSE 88 -- 86  BUN 5* -- 6  CREATININE 0.62 -- 0.63  CALCIUM 8.7 -- 9.5  MG -- 1.7 --  PHOS -- -- --    Basename 03/31/11 2358  AST 20  ALT 12  ALKPHOS 45  BILITOT 0.3  PROT 7.2  ALBUMIN 3.3*   Basename 04/01/11 0910 03/31/11 1338  WBC 6.9 8.7  NEUTROABS -- 6.0  HGB 10.5* 11.3*  HCT 32.8* 35.7*  MCV 77.7* 77.6*  PLT 275 342   Basename 03/31/11 2358  HGBA1C 6.5*   Basename 03/31/11 2358  TSH 0.792  T4TOTAL --  T3FREE --  THYROIDAB --   Micro Results: Recent Results (from the past 240 hour(s))  CSF CULTURE     Status: Normal (Preliminary result)   Collection Time   03/31/11  6:30 PM      Component Value Range Status Comment   Specimen Description CSF   Final    Special Requests NONE   Final    Gram Stain     Final    Value: CYTOSPIN MODERATE WBC PRESENT, PREDOMINANTLY MONONUCLEAR     NO ORGANISMS SEEN   Culture PENDING   Incomplete    Report Status PENDING   Incomplete     Studies/Results: Ct Head Wo Contrast  03/31/2011  *RADIOLOGY REPORT*  Clinical Data: Headache and neck pain.  CT HEAD WITHOUT CONTRAST  Technique:  Contiguous axial images were obtained from the base of the skull through the vertex without contrast.  Comparison: Head CT scan 08/20/2007.  Findings: The brain appears normal without evidence of acute infarction, hemorrhage, mass lesion, mass effect, midline shift or abnormal extra-axial fluid collection.  No hydrocephalus or pneumocephalus.  Calvarium is intact.  Imaged paranasal sinuses and mastoid air cells are clear.  IMPRESSION: Normal study.  Original Report Authenticated By: Bernadene Bell. D'ALESSIO, M.D.   Dg Fluoro Guide Ndl Plc/bx  03/31/2011  *RADIOLOGY REPORT*  Clinical Data:  Headache.  Rule out meningitis  DIAGNOSTIC LUMBAR PUNCTURE UNDER  FLUOROSCOPIC GUIDANCE  Fluoroscopy time:  Three minutes.  Technique:  Informed consent was obtained from the patient prior to the procedure, including potential complications of headache, allergy, and pain.   With the patient prone, the lower back was prepped with Betadine.  1% Lidocaine was used for local anesthesia. Lumbar puncture was performed at the L3-4 level using Mcknight 20 gauge needle with return of blood tinged followed by clearing CSF with an opening pressure of 21 cm water.   9 ml of CSF were obtained for laboratory studies.  The patient tolerated the procedure well and there were no apparent complications.  IMPRESSION: Successful lumbar puncture  Original Report Authenticated By: Camelia Phenes, M.D.   Medications: Scheduled Meds:   . acyclovir  500 mg Intravenous Q8H  . cefTRIAXone (ROCEPHIN)  IV  2 g Intravenous To Major  . cefTRIAXone (ROCEPHIN)  IV  2 g Intravenous Q12H  . cefTRIAXone      . dextrose      .  HYDROmorphone (DILAUDID)  injection  1 mg Intramuscular Once  . multivitamins ther. w/minerals  1 tablet Oral Daily  . vancomycin  1,000 mg Intravenous Once  . vancomycin  1,000 mg Intravenous Q8H  . DISCONTD: cefTRIAXone (ROCEPHIN)  IV  1 g Intravenous QHS   Continuous Infusions:   . sodium chloride     PRN Meds:.acetaminophen, acetaminophen, alum & mag hydroxide-simeth, HYDROcodone-acetaminophen, morphine, ondansetron (ZOFRAN) IV, ondansetron, DISCONTD: ondansetron (ZOFRAN) IV  ASSESSMENT & PLAN: Principal Problem:  *Headache Active Problems:  Stiffness of neck  Obesity  Chills  Nausea  1. Meningitis, likely aseptic: Patient admitted to the hospital because of headache and now neck stiffness. CSF analysis showed protein of 66 and WBCs of 300, mainly lymphocytes. consistent with viral meningitis. Patient empirically started on vancomycin, Rocephin and acyclovir. Waiting for the culture. ID to evaluate for recurrent aseptic meningitis. Please note this is her third bout of aseptic meningitis.  2. Insulin resistance and diabetes: Patient hemoglobin A1c is 6.5. Which is consistent with diabetes. In her case I will talk to her about changing her dietary habits and losing weight.  3. Obesity: Patient counseled about weight loss.   LOS: 1 day   Sandra Mcknight 04/01/2011, 2:38 PM

## 2011-04-02 NOTE — Progress Notes (Signed)
INFECTIOUS DISEASES PROGRESS NOTE  Patient ID: Sandra Mcknight, female   DOB: 10/30/1980, 30 y.o.   MRN: [id number] With aseptic meningitis on acyclovir, headache improving  S: patients states that her headache and neck pain is considerably better. No fevers, chills, nightsweasts. No nausea/vomiting.  Abtx: Acyclovir IV  PE: BP 117/55  Pulse 83  Temp(Src) 98.1 F (36.7 C) (Oral)  Resp 24  Ht 5\' 2"  (1.575 m)  Wt 218 lb (98.884 kg)  BMI 39.87 kg/m2  SpO2 100%  General Appearance:    Alert, cooperative, no distress, appears stated age, slightly disheveled.  Head:    Normocephalic, without obvious abnormality, atraumatic  Eyes:    PERRL, conjunctiva/corneas clear, EOM's intact, fundi    benign, both eyes  Ears:    Normal TM's and external ear canals, both ears  Nose:   Nares normal, septum midline, mucosa normal, no drainage    or sinus tenderness  Throat:   Lips, mucosa, and tongue normal; teeth and gums normal  Neck:   Supple, symmetrical, trachea midline, no adenopathy;    thyroid:  no enlargement/tenderness/nodules; no carotid   bruit or JVD  Back:     Symmetric, no curvature, ROM normal, no CVA tenderness  Lungs:     Clear to auscultation bilaterally, respirations unlabored      Heart:    Regular rate and rhythm, S1 and S2 normal, no murmur, rub   or gallop     Abdomen:     Soft, non-tender, bowel sounds active all four quadrants,    no masses, no organomegaly        Extremities:   Extremities normal, atraumatic, no cyanosis or edema  Pulses:   2+ and symmetric all extremities  Skin:   Skin color, texture, turgor normal, no rashes or lesions  Lymph nodes:   Cervical, supraclavicular, and axillary nodes normal     Labs: CBC    Component Value Date/Time   WBC 6.9 04/01/2011 0910   RBC 4.22 04/01/2011 0910   HGB 10.5* 04/01/2011 0910   HCT 32.8* 04/01/2011 0910   PLT 275 04/01/2011 0910   MCV 77.7* 04/01/2011 0910   MCH 24.9* 04/01/2011 0910   MCHC 32.0  04/01/2011 0910   RDW 16.2* 04/01/2011 0910   LYMPHSABS 1.9 03/31/2011 1338   MONOABS 0.6 03/31/2011 1338   EOSABS 0.1 03/31/2011 1338   BASOSABS 0.0 03/31/2011 1338    BMET    Component Value Date/Time   NA 136 04/01/2011 0910   K 3.7 04/01/2011 0910   CL 103 04/01/2011 0910   CO2 25 04/01/2011 0910   GLUCOSE 88 04/01/2011 0910   BUN 5* 04/01/2011 0910   CREATININE 0.62 04/01/2011 0910   CALCIUM 8.7 04/01/2011 0910   GFRNONAA >90 04/01/2011 0910   GFRAA >90 04/01/2011 0910   Micro: CSF NGTD < 48hrs  A/P: aseptic meningitis: 1) 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  -HSV pcr, enterovirus pcr pending. HSV serology also pending - if continues to improve, can switch to orals with intent to discontinue if HSV pcr is negative

## 2011-04-02 NOTE — Progress Notes (Signed)
Behind left eye

## 2011-04-02 NOTE — Progress Notes (Signed)
DAILY PROGRESS NOTE                              GENERAL INTERNAL MEDICINE [facility]  SUBJECTIVE:  Denies headache, still have a some neck stiffness and back pain.  OBJECTIVE: BP 94/44  Pulse 74  Temp(Src) 97.4 F (36.3 C) (Oral)  Resp 18  Ht 5\' 2"  (1.575 m)  Wt 98.884 kg (218 lb)  BMI 39.87 kg/m2  SpO2 100%  Intake/Output Summary (Last 24 hours) at 04/02/11 1346 Last data filed at 04/02/11 0900  Gross per 24 hour  Intake    390 ml  Output      3 ml  Net    387 ml                      Weight change:  Physical Exam: General: Alert and awake oriented x3 not in any acute distress. HEENT: anicteric sclera, pupils equal reactive to light and accommodation CVS: S1-S2 heard, no murmur rubs or gallops Chest: clear to auscultation bilaterally, no wheezing rales or rhonchi Abdomen:  normal bowel sounds, soft, nontender, nondistended, no organomegaly Neuro: Cranial nerves II-XII intact, no focal neurological deficits Extremities: no cyanosis, no clubbing or edema noted bilaterally   Lab Results:  Basename 04/01/11 0910 03/31/11 2358 03/31/11 1338  NA 136 -- 139  K 3.7 -- 3.8  CL 103 -- 103  CO2 25 -- 26  GLUCOSE 88 -- 86  BUN 5* -- 6  CREATININE 0.62 -- 0.63  CALCIUM 8.7 -- 9.5  MG -- 1.7 --  PHOS -- -- --    Basename 03/31/11 2358  AST 20  ALT 12  ALKPHOS 45  BILITOT 0.3  PROT 7.2  ALBUMIN 3.3*    Basename 04/01/11 0910 03/31/11 1338  WBC 6.9 8.7  NEUTROABS -- 6.0  HGB 10.5* 11.3*  HCT 32.8* 35.7*  MCV 77.7* 77.6*  PLT 275 342    Basename 03/31/11 2358  HGBA1C 6.5*    Basename 03/31/11 2358  TSH 0.792  T4TOTAL --  T3FREE --  THYROIDAB --   Micro Results: Recent Results (from the past 240 hour(s))  CSF CULTURE     Status: Normal (Preliminary result)   Collection Time   03/31/11  6:30 PM      Component Value Range Status Comment   Specimen Description CSF   Final    Special Requests NONE   Final    Gram Stain     Final    Value:  CYTOSPIN MODERATE WBC PRESENT, PREDOMINANTLY MONONUCLEAR     NO ORGANISMS SEEN   Culture NO GROWTH   Final    Report Status PENDING   Incomplete     Studies/Results: Ct Head Wo Contrast  03/31/2011  *RADIOLOGY REPORT*  Clinical Data: Headache and neck pain.  CT HEAD WITHOUT CONTRAST  Technique:  Contiguous axial images were obtained from the base of the skull through the vertex without contrast.  Comparison: Head CT scan 08/20/2007.  Findings: The brain appears normal without evidence of acute infarction, hemorrhage, mass lesion, mass effect, midline shift or abnormal extra-axial fluid collection.  No hydrocephalus or pneumocephalus.  Calvarium is intact.  Imaged paranasal sinuses and mastoid air cells are clear.  IMPRESSION: Normal study.  Original Report Authenticated By: Bernadene Bell. D'ALESSIO, M.D.   Dg Fluoro Guide Ndl Plc/bx  03/31/2011  *RADIOLOGY REPORT*  Clinical Data:  Headache.  Rule out meningitis  DIAGNOSTIC LUMBAR PUNCTURE UNDER FLUOROSCOPIC GUIDANCE  Fluoroscopy time:  Three minutes.  Technique:  Informed consent was obtained from the patient prior to the procedure, including potential complications of headache, allergy, and pain.   With the patient prone, the lower back was prepped with Betadine.  1% Lidocaine was used for local anesthesia. Lumbar puncture was performed at the L3-4 level using a 20 gauge needle with return of blood tinged followed by clearing CSF with an opening pressure of 21 cm water.   9 ml of CSF were obtained for laboratory studies.  The patient tolerated the procedure well and there were no apparent complications.  IMPRESSION: Successful lumbar puncture  Original Report Authenticated By: Camelia Phenes, M.D.   Medications: Scheduled Meds:    . acyclovir  500 mg Intravenous Q8H  . multivitamins ther. w/minerals  1 tablet Oral Daily  . DISCONTD: cefTRIAXone (ROCEPHIN)  IV  1 g Intravenous QHS  . DISCONTD: cefTRIAXone (ROCEPHIN)  IV  2 g Intravenous Q12H  .  DISCONTD: vancomycin  1,000 mg Intravenous Q8H   Continuous Infusions:    . sodium chloride 75 mL/hr (04/01/11 2235)   PRN Meds:.acetaminophen, acetaminophen, alum & mag hydroxide-simeth, HYDROcodone-acetaminophen, morphine, ondansetron (ZOFRAN) IV, ondansetron  ASSESSMENT & PLAN: Principal Problem:  *Headache Active Problems:  Stiffness of neck  Obesity  Chills  Nausea  1. 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 Patient is on the acyclovir IV. Her symptoms improved very much. Probably can be discharged in the morning and to followup with infectious diseases as outpatient if needed. IV please advise about going home on oral Valtrex.  2. Insulin resistance and diabetes: Patient hemoglobin A1c is 6.5. Which is consistent with diabetes. In her case I will talk to her about changing her dietary habits and losing weight.  3. Obesity: Patient counseled about weight loss.   LOS: 2 days   Sandra Mcknight A 04/02/2011, 1:46 PM

## 2011-04-02 NOTE — ED Provider Notes (Signed)
I saw and evaluated the patient, reviewed the resident's note and I agree with the findings and plan.  I was present for or available for the procedure.  Raeford Razor, MD 04/02/11 [phone]

## 2011-04-03 LAB — HSV 2 ANTIBODY, IGG: HSV 2 Glycoprotein G Ab, IgG: 7.89 IV — ABNORMAL HIGH

## 2011-04-03 LAB — PATHOLOGIST SMEAR REVIEW

## 2011-04-03 LAB — HSV 1 ANTIBODY, IGG: HSV 1 Glycoprotein G Ab, IgG: 1.42 IV — ABNORMAL HIGH

## 2011-04-03 LAB — HERPES SIMPLEX VIRUS(HSV) DNA BY PCR: HSV 2 DNA: NOT DETECTED

## 2011-04-03 MED ORDER — VALACYCLOVIR HCL 500 MG PO TABS
1000.0000 mg | ORAL_TABLET | Freq: Three times a day (TID) | ORAL | Status: DC
  Administered 2011-04-03: 1000 mg via ORAL
  Filled 2011-04-03 (×3): qty 2

## 2011-04-03 MED ORDER — VALACYCLOVIR HCL 1 G PO TABS: 1000.0000 mg | ORAL_TABLET | Freq: Three times a day (TID) | ORAL | Status: AC

## 2011-04-03 NOTE — Discharge Summary (Signed)
HOSPITAL DISCHARGE SUMMARY  Sandra Mcknight  MRN: [id number]  DOB:07-04-1980  Date of Admission: 03/31/2011 Date of Discharge: 04/03/2011         LOS: 3 days   Attending Physician:Shandrea Lusk A  Patient's PCP: [facility] health Department Consults:   infectious disease Dr. Gwen Her Dam  Discharge Diagnoses: Present on Admission:  .Headache .Stiffness of neck .Obesity .Chills .Nausea   Current Discharge Medication List    START taking these medications   Details  valACYclovir (VALTREX) 1000 MG tablet Take 1 tablet (1,000 mg total) by mouth 3 (three) times daily. Qty: 30 tablet, Refills: 0      CONTINUE these medications which have NOT CHANGED   Details  ibuprofen (ADVIL,MOTRIN) 200 MG tablet Take 400 mg by mouth every 8 (eight) hours as needed. For headaches.           Brief Admission History: 30 year old female with a past medical history significant for obesity, first degree relative with DM and previews admission to the hospital about 2 years ago due to viral meningitis; who came into the hospital secondary to headaches, neck stiffness, and generalized malaise. In the ED patient was found to be low grade fever at 99.9 and an LP performed by IR demonstrated elevated protein and more than 300 WBC's. CT of the head was negative for any abnormalities. [facility] has been called to admit this patient for further evaluation and treatment of what appears to be meningitis (viral).  Hospital Course: Present on Admission:  .Headache .Stiffness of neck .Obesity .Chills .Nausea:  1. Acute aseptic meningitis: Patient presented to the hospital with nausea vomiting nuchal stiffness and rigidity as well as back pain and headache. Patient did have LP performed at the time of admission showed elevated protein and more than 300 wbc's primarily lymphocytes. Consistent with aseptic meningitis. Patient admitted to the hospital started on IV acyclovir  and ID was consulted because of recurrent aseptic meningitis. They did recommend total 14 days of Valtrex. And followup with them as needed. Patient's HIV status is negative.  2. Obesity: Patient counseled about her weight.  3. Anemia: Iron deficiency anemia. Which is not uncommon for someone in her age. Patient denies any melena or overt bleeding, recommend to take over-the-counter iron supplementations or multivitamins. New  4. Elevated hemoglobin A1c. Patient hemoglobin A1c 6.5 which is qualifies for diabetes mellitus type 2 diagnosis. Patient does not have any symptoms. And this is only when reading. Patient instructed to followup with her primary care physician to doublecheck that. Patient instructed to lower her caloric intake and the to lose some weight. Recommend that to be checked as outpatient if he still 6.5 or higher to start her on oral hypoglycemic agent.    Day of Discharge BP 90/65  Pulse 91  Temp(Src) 97.6 F (36.4 C) (Oral)  Resp 16  Ht 5\' 2"  (1.575 m)  Wt 98.884 kg (218 lb)  BMI 39.87 kg/m2  SpO2 100% Physical Exam: GEN: No acute distress, cooperative with exam PSYCH: He is alert and oriented x4; does not appear anxious does not appear depressed; affect is normal  HEENT: Mucous membranes pink and anicteric;  Mouth: without oral thrush or lesions Eyes: PERRLA; EOM intact;  Neck: no cervical lymphadenopathy nor thyromegaly or carotid bruit; no JVD;  CHEST WALL: No tenderness, symmetrical to breathing bilaterally CHEST: Normal respiration, clear to auscultation bilaterally  HEART: Regular rate and rhythm; no murmurs, rubs or gallops, S1 and S2 heard  BACK: No kyphosis  or scoliosis; no CVA tenderness  ABDOMEN:  soft non-tender; no masses, no organomegaly, normal abdominal bowel sounds; no pannus; no intertriginous candida.  EXTREMITIES: No bone or joint deformity; no edema; no ulcerations.  PULSES: 2+ and symmetric, neurovascularity is intact SKIN: Normal hydration no  rash or ulceration, no flushing or suspicious lesions  CNS: Cranial nerves 2-12 grossly intact no focal neurologic deficit, coordination is intact gait not tested    No results found for this or any previous visit (from the past 24 hour(s)).  Disposition: Home   Follow-up Appts: Discharge Orders    Future Orders Please Complete By Expires   Diet general      Increase activity slowly         Follow-up Information    Follow up with [facility] [place]. Make an appointment in 1 week.   Contact information:   [address] [place] [address]       Follow up with Acey Lav, MD. (As needed)    Contact information:   [address] [place] [address] [phone]          I spent 40 minutes completing paperwork and coordinating discharge efforts. SignedClydia Llano A 04/03/2011, 2:24 PM

## 2011-04-03 NOTE — Progress Notes (Signed)
Utilization review completed.  

## 2011-04-03 NOTE — Progress Notes (Signed)
   CARE MANAGEMENT NOTE 04/03/2011  Patient:  Sandra Mcknight, Sandra Mcknight   Account Number:  [id number]  Date Initiated:  04/03/2011  Documentation initiated by:  Donn Pierini  Subjective/Objective Assessment:   Pt admitted with Headache, nausea     Action/Plan:   PTA pt lived at home, independent with ADLs   Anticipated DC Date:  04/03/2011   Anticipated DC Plan:  HOME/SELF CARE      DC Planning Services  CM consult      Choice offered to / List presented to:             Status of service:  Completed, signed off Medicare Important Message given?   (If response is "NO", the following Medicare IM given date fields will be blank) Date Medicare IM given:   Date Additional Medicare IM given:    Discharge Disposition:  HOME/SELF CARE  Per UR Regulation:    Comments:  04/03/11- 1215- Donn Pierini RN, BSN [phone] Pt for possible d/c today, no anticipated d/c needs- f/u at 1400- spoke with pt at bedside regarding PCP needs. per conversation pt has Medicaid and [facility] - pt states that she would rather have a different assigned PCP. Explained that she would need to go through her case worker with DSS to get her PCP changed. Explained that if she went to an unassigned doctor that she would have to pay out of pocket for services. Pt is to follow up with her case worker regarding changing her PCP.

## 2011-04-03 NOTE — Progress Notes (Signed)
INFECTIOUS DISEASES PROGRESS NOTE  Patient ID: Sandra Mcknight, female   DOB: [birth year], 30 y.o.   MRN: [id number] With aseptic meningitis on acyclovir, headache improving  S: feels better Abtx: Acyclovir IV  PE: BP 107/46  Pulse 82  Temp(Src) 98.7 F (37.1 C) (Oral)  Resp 20  Ht 5\' 2"  (1.575 m)  Wt 218 lb (98.884 kg)  BMI 39.87 kg/m2  SpO2 100%  General Appearance:    Alert, cooperative, no distress, appears stated age, slightly disheveled.  Head:    Normocephalic, without obvious abnormality, atraumatic  Eyes:    PERRL, conjunctiva/corneas clear, EOM's intact, fundi    benign, both eyes  Ears:    Normal TM's and external ear canals, both ears  Nose:   Nares normal, septum midline, mucosa normal, no drainage    or sinus tenderness  Throat:   Lips, mucosa, and tongue normal; teeth and gums normal  Neck:   Supple, symmetrical, trachea midline, no adenopathy;    thyroid:  no enlargement/tenderness/nodules; no carotid   bruit or JVD  Back:     Symmetric, no curvature, ROM normal, no CVA tenderness  Lungs:     Clear to auscultation bilaterally, respirations unlabored      Heart:    Regular rate and rhythm, S1 and S2 normal, no murmur, rub   or gallop                             Labs: CBC    Component Value Date/Time   WBC 6.9 04/01/2011 0910   RBC 4.22 04/01/2011 0910   HGB 10.5* 04/01/2011 0910   HCT 32.8* 04/01/2011 0910   PLT 275 04/01/2011 0910   MCV 77.7* 04/01/2011 0910   MCH 24.9* 04/01/2011 0910   MCHC 32.0 04/01/2011 0910   RDW 16.2* 04/01/2011 0910   LYMPHSABS 1.9 03/31/2011 1338   MONOABS 0.6 03/31/2011 1338   EOSABS 0.1 03/31/2011 1338   BASOSABS 0.0 03/31/2011 1338    BMET    Component Value Date/Time   NA 136 04/01/2011 0910   K 3.7 04/01/2011 0910   CL 103 04/01/2011 0910   CO2 25 04/01/2011 0910   GLUCOSE 88 04/01/2011 0910   BUN 5* 04/01/2011 0910   CREATININE 0.62 04/01/2011 0910   CALCIUM 8.7 04/01/2011 0910   GFRNONAA >90  04/01/2011 0910   GFRAA >90 04/01/2011 0910   Micro: CSF no growth A/P: aseptic meningitis: 1) 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  -HSV pcr, enterovirus pcr pending. HSV serology also pending -change to valtrex 1 g po tid to complete 14 days total antiviral therapy --call [phone] to arrange fu in [facility] for patient if she desired fu with ID --she needs referral to PCP

## 2011-04-04 LAB — CSF CULTURE W GRAM STAIN

## 2011-08-24 LAB — CYTOLOGY - PAP: Pap: NEGATIVE

## 2011-08-24 LAB — TSH+FREE T4: T3, Free: 2.6

## 2011-09-06 ENCOUNTER — Encounter: Payer: Self-pay | Admitting: Obstetrics & Gynecology

## 2011-10-09 ENCOUNTER — Encounter: Payer: Medicaid Other | Admitting: Obstetrics & Gynecology

## 2011-11-03 ENCOUNTER — Encounter: Payer: Medicaid Other | Admitting: Obstetrics & Gynecology

## 2011-11-23 ENCOUNTER — Encounter: Payer: Self-pay | Admitting: Obstetrics & Gynecology

## 2011-11-23 ENCOUNTER — Ambulatory Visit (INDEPENDENT_AMBULATORY_CARE_PROVIDER_SITE_OTHER): Payer: Medicaid Other | Admitting: Obstetrics & Gynecology

## 2011-11-23 VITALS — BP 120/80 | HR 84 | Temp 97.5°F | Ht 62.0 in | Wt 212.4 lb

## 2011-11-23 DIAGNOSIS — E282 Polycystic ovarian syndrome: Secondary | ICD-10-CM

## 2011-11-23 LAB — LIPID PANEL
Cholesterol: 159 mg/dL (ref 0–200)
HDL: 45 mg/dL (ref >39)
Total CHOL/HDL Ratio: 3.5 Ratio
Triglycerides: 74 mg/dL (ref <150)
VLDL: 15 mg/dL (ref 0–40)

## 2011-11-23 LAB — COMPREHENSIVE METABOLIC PANEL
AST: 15 U/L (ref 0–37)
Alkaline Phosphatase: 39 U/L (ref 39–117)
BUN: 9 mg/dL (ref 6–23)
Creat: 0.65 mg/dL (ref 0.50–1.10)
Glucose, Bld: 79 mg/dL (ref 70–99)
Total Bilirubin: 0.2 mg/dL — ABNORMAL LOW (ref 0.3–1.2)

## 2011-11-23 MED ORDER — LEVONORGESTREL-ETHINYL ESTRAD 0.1-20 MG-MCG PO TABS: 1.0000 | ORAL_TABLET | Freq: Every day | ORAL | Status: DC

## 2011-11-23 NOTE — Progress Notes (Signed)
  Subjective:    Patient ID: Sandra Mcknight, female    DOB: [birth year], 31 y.o.   MRN: [id number]  HPI  31 yo S AA G2P2 who [facility] of probable PCOS. She describes her periods as "irregular". She may skip up to an entire year, usually she only skips 2-3 months. But then she may bleed heavily for up to a month. She also complains of unwanted hair growth on her neck and chest.   Review of Systems She uses condoms "sometimes" but she doesn't really want to be pregnant now. She has migraines but denies an aura. She reports a [facility] at the [facility].    Objective:   Physical Exam        Assessment & Plan:  PCOS- I will get routine preventative lab work today. She and I have discussed treatment options to help prevent uterine hyperplasia/cancer including cyclic provera, Mirena, and OCPs. After a thorough discussion, she opts for OCPs. I will have her back for a BP check in 2-3 weeks.

## 2012-05-19 ENCOUNTER — Encounter (HOSPITAL_COMMUNITY): Payer: Self-pay | Admitting: Emergency Medicine

## 2012-05-19 ENCOUNTER — Emergency Department (HOSPITAL_COMMUNITY)
Admission: EM | Admit: 2012-05-19 | Discharge: 2012-05-19 | Disposition: A | Discharge status: Home / Self Care | Payer: Medicaid Other | Attending: Emergency Medicine | Admitting: Emergency Medicine

## 2012-05-19 DIAGNOSIS — G43909 Migraine, unspecified, not intractable, without status migrainosus: Secondary | ICD-10-CM | POA: Insufficient documentation

## 2012-05-19 DIAGNOSIS — Z79899 Other long term (current) drug therapy: Secondary | ICD-10-CM | POA: Insufficient documentation

## 2012-05-19 DIAGNOSIS — Z8669 Personal history of other diseases of the nervous system and sense organs: Secondary | ICD-10-CM | POA: Insufficient documentation

## 2012-05-19 DIAGNOSIS — E669 Obesity, unspecified: Secondary | ICD-10-CM | POA: Insufficient documentation

## 2012-05-19 DIAGNOSIS — F17200 Nicotine dependence, unspecified, uncomplicated: Secondary | ICD-10-CM | POA: Insufficient documentation

## 2012-05-19 MED ORDER — METOCLOPRAMIDE HCL 5 MG/ML IJ SOLN
10.0000 mg | Freq: Once | INTRAMUSCULAR | Status: AC
  Administered 2012-05-19: 10 mg via INTRAVENOUS
  Filled 2012-05-19: qty 2

## 2012-05-19 NOTE — ED Notes (Signed)
Pt given d/c teaching. Pt verbalizes understanding of d/c teaching. Pt educated on pain management at home. Pt has no further questions upon d/c. Pt does not show signs of acute distress upon d/c.

## 2012-05-19 NOTE — ED Provider Notes (Signed)
History     CSN: [id number]  Arrival date & time 05/19/12  1512   First MD Initiated Contact with Patient 05/19/12 1531      Chief Complaint  Patient presents with  . Headache    (Consider location/radiation/quality/duration/timing/severity/associated sxs/prior treatment) HPI Patient developed a headache 1 AM today while at work gradual onset throbbing in nature typical of migraine she's had the past she is treated herself with Aleve, without relief. Headache is severe at the top of her head worse with moving her head not improved with anything no fever no other complaint. No other associated symptoms. Patient gets similar headaches approximately once per month Past Medical History  Diagnosis Date  . Meningitis   . Obesity (BMI 35.0-39.9 without comorbidity)    migraine headache  History reviewed. No pertinent past surgical history.  Family History  Problem Relation Age of Onset  . Diabetes Mother     History  Substance Use Topics  . Smoking status: Current Some Day Smoker  . Smokeless tobacco: Never Used  . Alcohol Use: Yes     Comment: socially    OB History    Grav Para Term Preterm Abortions TAB SAB Ect Mult Living   2 2 2       2       Review of Systems  Constitutional: Negative.   Respiratory: Negative.   Cardiovascular: Negative.   Gastrointestinal: Negative.   Musculoskeletal: Negative.   Skin: Negative.   Neurological: Positive for headaches.  Hematological: Negative.   Psychiatric/Behavioral: Negative.   All other systems reviewed and are negative.    Allergies  Review of patient's allergies indicates no known allergies.  Home Medications   Current Outpatient Rx  Name  Route  Sig  Dispense  Refill  . IBUPROFEN 200 MG PO TABS   Oral   Take 400 mg by mouth every 8 (eight) hours as needed. For headaches.          Marland Kitchen LEVONORGESTREL-ETHINYL ESTRAD 0.1-20 MG-MCG PO TABS   Oral   Take 1 tablet by mouth daily.   1 Package   11     BP  110/70  Pulse 96  Temp 98.2 F (36.8 C) (Oral)  Resp 20  SpO2 97%  Physical Exam  Nursing note and vitals reviewed. Constitutional: She appears well-developed and well-nourished.  HENT:  Head: Normocephalic and atraumatic.       Optic discs sharp bilaterally  Eyes: Conjunctivae normal are normal. Pupils are equal, round, and reactive to light.  Neck: Neck supple. No tracheal deviation present. No thyromegaly present.       No signs of meningitis  Cardiovascular: Normal rate and regular rhythm.   No murmur heard. Pulmonary/Chest: Effort normal and breath sounds normal.  Abdominal: Soft. Bowel sounds are normal. She exhibits no distension. There is no tenderness.       Obese  Musculoskeletal: Normal range of motion. She exhibits no edema and no tenderness.       Gait normal Romberg normal pronator drift normal  Neurological: She is alert. She has normal reflexes. She displays normal reflexes. No cranial nerve deficit. Coordination normal.  Skin: Skin is warm and dry. No rash noted.  Psychiatric: She has a normal mood and affect.    ED Course  Procedures (including critical care time)  Labs Reviewed - No data to display No results found.   No diagnosis found.   5:10 PM feels much improved after treatment with intravenous Reglan. He's alert Glasgow Coma  Score 15 no distress MDM  Plan return as needed or see health Department(pts PMD) Diagnosis migraine headache        Doug Sou, MD 05/19/12 [phone]

## 2012-05-19 NOTE — ED Notes (Signed)
Onset today 1400 while at work developed headache throbbing 10/10 denies nausea or emesis.

## 2012-07-20 ENCOUNTER — Encounter (HOSPITAL_COMMUNITY): Payer: Self-pay | Admitting: Emergency Medicine

## 2012-07-20 DIAGNOSIS — W2209XA Striking against other stationary object, initial encounter: Secondary | ICD-10-CM | POA: Insufficient documentation

## 2012-07-20 DIAGNOSIS — Y9289 Other specified places as the place of occurrence of the external cause: Secondary | ICD-10-CM | POA: Insufficient documentation

## 2012-07-20 DIAGNOSIS — E669 Obesity, unspecified: Secondary | ICD-10-CM | POA: Insufficient documentation

## 2012-07-20 DIAGNOSIS — Y9389 Activity, other specified: Secondary | ICD-10-CM | POA: Insufficient documentation

## 2012-07-20 DIAGNOSIS — R51 Headache: Secondary | ICD-10-CM | POA: Insufficient documentation

## 2012-07-20 DIAGNOSIS — F17200 Nicotine dependence, unspecified, uncomplicated: Secondary | ICD-10-CM | POA: Insufficient documentation

## 2012-07-20 DIAGNOSIS — S6990XA Unspecified injury of unspecified wrist, hand and finger(s), initial encounter: Secondary | ICD-10-CM | POA: Insufficient documentation

## 2012-07-20 NOTE — ED Notes (Signed)
PT. REPORTS MIGRAINE HEADACHE FOR 2 DAYS AND INJURED RIGHT HAND WITH PAIN /SWELLING STATES HIT IT AGAINST A WALL.

## 2012-07-21 ENCOUNTER — Emergency Department (HOSPITAL_COMMUNITY)
Admission: EM | Admit: 2012-07-21 | Discharge: 2012-07-21 | Discharge status: Against Medical Advice | Payer: Medicaid Other | Attending: Emergency Medicine | Admitting: Emergency Medicine

## 2012-07-21 NOTE — ED Notes (Signed)
PT called, no response x 1

## 2012-07-22 ENCOUNTER — Encounter (HOSPITAL_COMMUNITY): Payer: Self-pay | Admitting: Emergency Medicine

## 2012-07-22 ENCOUNTER — Emergency Department (HOSPITAL_COMMUNITY)
Admission: EM | Admit: 2012-07-22 | Discharge: 2012-07-22 | Disposition: A | Discharge status: Home / Self Care | Payer: Medicaid Other | Source: Home / Self Care | Attending: Family Medicine | Admitting: Family Medicine

## 2012-07-22 DIAGNOSIS — R51 Headache: Secondary | ICD-10-CM

## 2012-07-22 DIAGNOSIS — R002 Palpitations: Secondary | ICD-10-CM

## 2012-07-22 MED ORDER — KETOROLAC TROMETHAMINE 30 MG/ML IJ SOLN
INTRAMUSCULAR | Status: AC
  Filled 2012-07-22: qty 1

## 2012-07-22 MED ORDER — KETOROLAC TROMETHAMINE 30 MG/ML IJ SOLN
30.0000 mg | Freq: Once | INTRAMUSCULAR | Status: AC
  Administered 2012-07-22: 30 mg via INTRAMUSCULAR

## 2012-07-22 NOTE — ED Provider Notes (Signed)
History     CSN: [id number]  Arrival date & time 07/22/12  1532   First MD Initiated Contact with Patient 07/22/12 1745      Chief Complaint  Patient presents with  . Headache  . Palpitations    (Consider location/radiation/quality/duration/timing/severity/associated sxs/prior treatment) HPI Comments: Pt with hx migraine headaches, feels like has one now for last 4 days. Usually able to self tx with tylenol and advil but it is not resolving. Also c/o "flutter" feeling in heart occasionally since yesterday.  Thinks is related to stress.   Patient is a 32 y.o. female presenting with headaches and palpitations. The history is provided by the patient.  Headache Location: top of head. Quality:  Dull Radiates to:  Does not radiate Severity currently:  10/10 Severity at highest:  10/10 Onset quality:  Gradual Duration:  4 days Timing:  Constant Progression:  Unchanged Chronicity:  New Similar to prior headaches: yes   Context: emotional stress   Relieved by:  Nothing Worsened by:  Activity Ineffective treatments:  Acetaminophen and NSAIDs Associated symptoms: no congestion, no dizziness, no fever, no neck pain, no numbness, no photophobia, no sinus pressure, no syncope, no visual change, no vomiting and no weakness   Palpitations  This is a new problem. The current episode started yesterday. The problem occurs hourly. The problem has not changed since onset.The problem is associated with stress. On average, each episode lasts 1 second. Associated symptoms include headaches. Pertinent negatives include no fever, no malaise/fatigue, no numbness, no chest pain, no syncope, no vomiting, no dizziness, no weakness and no shortness of breath. She has tried nothing for the symptoms.    Past Medical History  Diagnosis Date  . Meningitis   . Obesity (BMI 35.0-39.9 without comorbidity)     History reviewed. No pertinent past surgical history.  Family History  Problem Relation Age of  Onset  . Diabetes Mother     History  Substance Use Topics  . Smoking status: Current Some Day Smoker  . Smokeless tobacco: Never Used  . Alcohol Use: Yes     Comment: socially    OB History   Grav Para Term Preterm Abortions TAB SAB Ect Mult Living   2 2 2       2       Review of Systems  Constitutional: Negative for fever, chills and malaise/fatigue.  HENT: Negative for congestion, neck pain and sinus pressure.   Eyes: Negative for photophobia and visual disturbance.  Respiratory: Negative for shortness of breath.   Cardiovascular: Positive for palpitations. Negative for chest pain and syncope.  Gastrointestinal: Negative for vomiting.  Neurological: Positive for headaches. Negative for dizziness, facial asymmetry, weakness and numbness.    Allergies  Review of patient's allergies indicates no known allergies.  Home Medications   Current Outpatient Rx  Name  Route  Sig  Dispense  Refill  . levonorgestrel-ethinyl estradiol (AVIANE,ALESSE,LESSINA) 0.1-20 MG-MCG tablet   Oral   Take 1 tablet by mouth daily.         . naproxen sodium (ANAPROX) 220 MG tablet   Oral   Take 440 mg by mouth as needed. For pain           BP 121/64  Pulse 104  Temp(Src) 98 F (36.7 C) (Oral)  SpO2 100%  LMP 07/22/2012  Physical Exam  Constitutional: She is oriented to person, place, and time. She appears well-developed and well-nourished. No distress.  HENT:  Right Ear: Tympanic membrane, external ear and ear  canal normal.  Left Ear: Tympanic membrane, external ear and ear canal normal.  Eyes: Pupils are equal, round, and reactive to light.  Neck: Normal range of motion. Neck supple.  Cardiovascular: Normal rate and regular rhythm.   Pulmonary/Chest: Effort normal and breath sounds normal.  Neurological: She is alert and oriented to person, place, and time. Gait normal.    ED Course  Procedures (including critical care time)  Labs Reviewed - No data to display No results  found.   1. Headache   2. Palpitations       MDM  Pt given IM toradol 30mg  and will go home and take benadryl and go to sleep. Pt to f/u with pcp about palpitations.  EKG today at [facility] is NSR, 87bpm.          Cathlyn Parsons, NP 07/22/12 1753

## 2012-07-22 NOTE — ED Notes (Signed)
Pt c/o headaches x 1 week. Unable to sleep. Has tried Tylenol and Advil with no relief. Also reports of "heart flutters". Pt reports she has been under a lot of stress at work and home. Feels palpitations mostly at work. Denies SOB or chest pain. Patient is alert and oriented.

## 2012-07-24 NOTE — ED Provider Notes (Signed)
Medical screening examination/treatment/procedure(s) were performed by resident physician or non-physician practitioner and as supervising physician I was immediately available for consultation/collaboration.   Jaydah Stahle DOUGLAS MD.   Kailia Starry D Bexton Haak, MD 07/24/12 1523 

## 2012-12-30 ENCOUNTER — Encounter (HOSPITAL_COMMUNITY): Payer: Self-pay | Admitting: Emergency Medicine

## 2012-12-30 ENCOUNTER — Emergency Department (HOSPITAL_COMMUNITY)
Admission: EM | Admit: 2012-12-30 | Discharge: 2012-12-30 | Disposition: A | Discharge status: Home / Self Care | Payer: Medicaid Other | Source: Home / Self Care | Attending: Family Medicine | Admitting: Family Medicine

## 2012-12-30 DIAGNOSIS — M25511 Pain in right shoulder: Secondary | ICD-10-CM

## 2012-12-30 DIAGNOSIS — M25519 Pain in unspecified shoulder: Secondary | ICD-10-CM

## 2012-12-30 MED ORDER — NAPROXEN 500 MG PO TABS: 500.0000 mg | ORAL_TABLET | Freq: Two times a day (BID) | ORAL | Status: DC | PRN

## 2012-12-30 MED ORDER — METHYLPREDNISOLONE ACETATE 80 MG/ML IJ SUSP
INTRAMUSCULAR | Status: AC
  Filled 2012-12-30: qty 1

## 2012-12-30 MED ORDER — METHYLPREDNISOLONE ACETATE 40 MG/ML IJ SUSP
80.0000 mg | Freq: Once | INTRAMUSCULAR | Status: AC
  Administered 2012-12-30: 80 mg via INTRAMUSCULAR

## 2012-12-30 NOTE — ED Notes (Signed)
Reports right shoulder pain. States fell on right shoulder twice in may. Pain gradually getting worse. Pain with raising arm above head. Limited ROM. Pain worse at night with lying down.  Pt is taking tylenol and BC powders with mild pain relief.

## 2012-12-30 NOTE — ED Provider Notes (Signed)
CSN: [id number]     Arrival date & time 12/30/12  1041 History   First MD Initiated Contact with Patient 12/30/12 1132     Chief Complaint  Patient presents with  . Shoulder Pain    fell on shoulder back in may. still having pain. symptoms gradually getting worse.   (Consider location/radiation/quality/duration/timing/severity/associated sxs/prior Treatment) HPI Comments: 23f presents for eval of chronic shoulder pain on the right shoulder.  She fell a few months ago and has had constant pain in the shoulder since then.  The pain is exacerbated by any movements of the shoulder and it very painful when she first wakes up in the AM.  She has been taking tylenol and BC powder but it doesn't help.  No new injury, no other Sxs.    Patient is a 32 y.o. female presenting with shoulder pain.  Shoulder Pain Pertinent negatives include no chest pain, no abdominal pain and no shortness of breath.    Past Medical History  Diagnosis Date  . Meningitis   . Obesity (BMI 35.0-39.9 without comorbidity)    History reviewed. No pertinent past surgical history. Family History  Problem Relation Age of Onset  . Diabetes Mother    History  Substance Use Topics  . Smoking status: Current Some Day Smoker  . Smokeless tobacco: Never Used  . Alcohol Use: Yes     Comment: socially   OB History   Grav Para Term Preterm Abortions TAB SAB Ect Mult Living   2 2 2       2      Review of Systems  Constitutional: Negative for fever and chills.  Eyes: Negative for visual disturbance.  Respiratory: Negative for cough and shortness of breath.   Cardiovascular: Negative for chest pain, palpitations and leg swelling.  Gastrointestinal: Negative for nausea, vomiting and abdominal pain.  Endocrine: Negative for polydipsia and polyuria.  Genitourinary: Negative for dysuria, urgency and frequency.  Musculoskeletal:       See HPI  Skin: Negative for rash.  Neurological: Negative for dizziness, weakness and  light-headedness.    Allergies  Review of patient's allergies indicates no known allergies.  Home Medications   Current Outpatient Rx  Name  Route  Sig  Dispense  Refill  . EXPIRED: levonorgestrel-ethinyl estradiol (AVIANE,ALESSE,LESSINA) 0.1-20 MG-MCG tablet   Oral   Take 1 tablet by mouth daily.         . naproxen (NAPROSYN) 500 MG tablet   Oral   Take 1 tablet (500 mg total) by mouth 2 (two) times daily as needed.   60 tablet   0   . naproxen sodium (ANAPROX) 220 MG tablet   Oral   Take 440 mg by mouth as needed. For pain          BP 127/82  Pulse 82  Temp(Src) 99.8 F (37.7 C) (Oral)  Resp 16  SpO2 99%  LMP 12/04/2012 Physical Exam  Nursing note and vitals reviewed. Constitutional: She is oriented to person, place, and time. Vital signs are normal. She appears well-developed and well-nourished. No distress.  Morbidly obese habitus  HENT:  Head: Normocephalic and atraumatic.  Pulmonary/Chest: Effort normal. No respiratory distress.  Musculoskeletal:       Right shoulder: She exhibits decreased range of motion and pain. She exhibits no tenderness, no bony tenderness, no swelling, no deformity and normal strength.       Arms: Neurological: She is alert and oriented to person, place, and time. She has normal strength. No  sensory deficit. Coordination normal.  Skin: Skin is warm and dry. No rash noted. She is not diaphoretic.  Psychiatric: She has a normal mood and affect. Judgment normal.    ED Course  Procedures (including critical care time) Labs Review Labs Reviewed - No data to display Imaging Review No results found.  MDM   1. Shoulder pain, right    80 mg Depo-Medrol IM here. Start naproxen twice a day when necessary. Followup with orthopedics if not improving   Meds ordered this encounter  Medications  . methylPREDNISolone acetate (DEPO-MEDROL) injection 80 mg    Sig:   . naproxen (NAPROSYN) 500 MG tablet    Sig: Take 1 tablet (500 mg  total) by mouth 2 (two) times daily as needed.    Dispense:  60 tablet    Refill:  0       Graylon Good, PA-C 12/30/12 1216

## 2012-12-31 NOTE — ED Provider Notes (Signed)
Medical screening examination/treatment/procedure(s) were performed by a resident physician or non-physician practitioner and as the supervising physician I was immediately available for consultation/collaboration.  Aadin Gaut, MD    Westly Hinnant S Sadey Yandell, MD 12/31/12 1339 

## 2013-02-12 ENCOUNTER — Emergency Department (HOSPITAL_COMMUNITY)
Admission: EM | Admit: 2013-02-12 | Discharge: 2013-02-13 | Disposition: A | Discharge status: Home / Self Care | Payer: Medicaid Other | Attending: Emergency Medicine | Admitting: Emergency Medicine

## 2013-02-12 ENCOUNTER — Emergency Department (HOSPITAL_COMMUNITY): Payer: Medicaid Other

## 2013-02-12 ENCOUNTER — Encounter (HOSPITAL_COMMUNITY): Payer: Self-pay | Admitting: Emergency Medicine

## 2013-02-12 DIAGNOSIS — Z3202 Encounter for pregnancy test, result negative: Secondary | ICD-10-CM | POA: Insufficient documentation

## 2013-02-12 DIAGNOSIS — F17200 Nicotine dependence, unspecified, uncomplicated: Secondary | ICD-10-CM | POA: Insufficient documentation

## 2013-02-12 DIAGNOSIS — R079 Chest pain, unspecified: Secondary | ICD-10-CM

## 2013-02-12 DIAGNOSIS — R0602 Shortness of breath: Secondary | ICD-10-CM | POA: Insufficient documentation

## 2013-02-12 DIAGNOSIS — Z8669 Personal history of other diseases of the nervous system and sense organs: Secondary | ICD-10-CM | POA: Insufficient documentation

## 2013-02-12 DIAGNOSIS — E669 Obesity, unspecified: Secondary | ICD-10-CM | POA: Insufficient documentation

## 2013-02-12 DIAGNOSIS — R059 Cough, unspecified: Secondary | ICD-10-CM | POA: Insufficient documentation

## 2013-02-12 DIAGNOSIS — R072 Precordial pain: Secondary | ICD-10-CM | POA: Insufficient documentation

## 2013-02-12 DIAGNOSIS — R05 Cough: Secondary | ICD-10-CM | POA: Insufficient documentation

## 2013-02-12 LAB — BASIC METABOLIC PANEL
CO2: 27 mEq/L (ref 19–32)
Chloride: 104 mEq/L (ref 96–112)
Creatinine, Ser: 0.65 mg/dL (ref 0.50–1.10)
Glucose, Bld: 113 mg/dL — ABNORMAL HIGH (ref 70–99)

## 2013-02-12 LAB — CBC
HCT: 37 % (ref 36.0–46.0)
Hemoglobin: 12.6 g/dL (ref 12.0–15.0)
MCV: 84.3 fL (ref 78.0–100.0)
Platelets: 281 10*3/uL (ref 150–400)
RBC: 4.39 MIL/uL (ref 3.87–5.11)
WBC: 9 10*3/uL (ref 4.0–10.5)

## 2013-02-12 LAB — D-DIMER, QUANTITATIVE: D-Dimer, Quant: 0.58 ug/mL-FEU — ABNORMAL HIGH (ref 0.00–0.48)

## 2013-02-12 LAB — POCT I-STAT TROPONIN I: Troponin i, poc: 0 ng/mL (ref 0.00–0.08)

## 2013-02-12 MED ORDER — SODIUM CHLORIDE 0.9 % IV BOLUS (SEPSIS)
1000.0000 mL | Freq: Once | INTRAVENOUS | Status: AC
  Administered 2013-02-12: 1000 mL via INTRAVENOUS

## 2013-02-12 MED ORDER — KETOROLAC TROMETHAMINE 30 MG/ML IJ SOLN
30.0000 mg | Freq: Once | INTRAMUSCULAR | Status: AC
  Administered 2013-02-12: 30 mg via INTRAVENOUS
  Filled 2013-02-12: qty 1

## 2013-02-12 NOTE — ED Provider Notes (Signed)
CSN: [id number]     Arrival date & time 02/12/13  1655 History   First MD Initiated Contact with Patient 02/12/13 2127     Chief Complaint  Patient presents with  . Chest Pain   (Consider location/radiation/quality/duration/timing/severity/associated sxs/prior Treatment) HPI Comments: 32 year old female presents with chest pains she woke up yesterday. She states it is a sharp chest pain that is on the right side of her chest. Coughing is worse. Using her arm or movement also makes it worse. She has a shortness of breath. She's not on recent trips, or had any leg swelling or hemoptysis. She's not a birth control. She took ibuprofen once yesterday which helped a little bit. Should not take anything for the pain today. She has no family history of heart disease. She does not have any hypertension or hyperlipidemia but does smoke.  Patient is a 32 y.o. female presenting with chest pain.  Chest Pain Associated symptoms: cough   Associated symptoms: no abdominal pain, no fever, no shortness of breath and not vomiting     Past Medical History  Diagnosis Date  . Meningitis   . Obesity (BMI 35.0-39.9 without comorbidity)    History reviewed. No pertinent past surgical history. Family History  Problem Relation Age of Onset  . Diabetes Mother    History  Substance Use Topics  . Smoking status: Current Some Day Smoker  . Smokeless tobacco: Never Used  . Alcohol Use: Yes     Comment: socially   OB History   Grav Para Term Preterm Abortions TAB SAB Ect Mult Living   2 2 2       2      Review of Systems  Constitutional: Negative for fever.  Respiratory: Positive for cough. Negative for shortness of breath.   Cardiovascular: Positive for chest pain.  Gastrointestinal: Negative for vomiting and abdominal pain.  All other systems reviewed and are negative.    Allergies  Review of patient's allergies indicates no known allergies.  Home Medications   Current Outpatient Rx  Name   Route  Sig  Dispense  Refill  . EXPIRED: levonorgestrel-ethinyl estradiol (AVIANE,ALESSE,LESSINA) 0.1-20 MG-MCG tablet   Oral   Take 1 tablet by mouth daily.          BP 129/78  Pulse 95  Temp(Src) 98.2 F (36.8 C) (Oral)  Resp 18  Wt 215 lb 4.8 oz (97.659 kg)  BMI 39.37 kg/m2  SpO2 97%  LMP 02/11/2013 Physical Exam  Nursing note and vitals reviewed. Constitutional: She is oriented to person, place, and time. She appears well-developed and well-nourished.  HENT:  Head: Normocephalic and atraumatic.  Right Ear: External ear normal.  Left Ear: External ear normal.  Nose: Nose normal.  Eyes: Right eye exhibits no discharge. Left eye exhibits no discharge.  Cardiovascular: Normal rate, regular rhythm and normal heart sounds.   Pulmonary/Chest: Effort normal and breath sounds normal. She exhibits tenderness.  Abdominal: Soft. There is no tenderness.  Musculoskeletal: She exhibits no edema.  Neurological: She is alert and oriented to person, place, and time.  Skin: Skin is warm and dry.    ED Course  Procedures (including critical care time) Labs Review Labs Reviewed  BASIC METABOLIC PANEL - Abnormal; Notable for the following:    Glucose, Bld 113 (*)    All other components within normal limits  D-DIMER, QUANTITATIVE - Abnormal; Notable for the following:    D-Dimer, Quant 0.58 (*)    All other components within normal limits  CBC  POCT I-STAT TROPONIN I  POCT PREGNANCY, URINE   Imaging Review Dg Chest 2 View  02/12/2013   CLINICAL DATA:  Right chest pain, shortness of Breath.  EXAM: CHEST - 2 VIEW  COMPARISON:  01/07/2007  FINDINGS: Linear scarring or subsegmental atelectasis in basilar segments of the left lower lobe. Right lung clear. No pneumothorax or effusion. Heart size upper limits normal. Regional bones unremarkable.  IMPRESSION: 1. Minimal linear scar or atelectasis, left lower lobe.   Electronically Signed   By: Oley Balm M.D.   On: 02/12/2013 18:20     EKG Interpretation     Ventricular Rate:  108 PR Interval:  142 QRS Duration: 80 QT Interval:  308 QTC Calculation: 412 R Axis:   25 Text Interpretation:  Sinus tachycardia Otherwise normal ECG No significant change since last tracing            MDM   1. Chest pain    Patient is low risk for ACS, has normal EKG (besides tachycardia), and normal trop after greater than 24 hours. Given her pleuritic sx and positive ddimer, will get CT of chest. Most likely etiology is benign chest wall pain. Will transfer care to Dr. Preston Fleeting with CTA pending, if neg feel she can be safely discharged with outpatient f/u.    Audree Camel, MD 02/13/13 [phone]

## 2013-02-12 NOTE — ED Notes (Signed)
Pt c/o midsternal CP worse with cough and movement; pt sts some SOB

## 2013-02-13 ENCOUNTER — Encounter (HOSPITAL_COMMUNITY): Payer: Self-pay | Admitting: Radiology

## 2013-02-13 ENCOUNTER — Emergency Department (HOSPITAL_COMMUNITY): Payer: Medicaid Other

## 2013-02-13 MED ORDER — NAPROXEN 500 MG PO TABS: 500.0000 mg | ORAL_TABLET | Freq: Two times a day (BID) | ORAL | Status: DC

## 2013-02-13 MED ORDER — IOHEXOL 350 MG/ML SOLN
80.0000 mL | Freq: Once | INTRAVENOUS | Status: AC | PRN
  Administered 2013-02-13: 50 mL via INTRAVENOUS

## 2013-02-13 NOTE — ED Provider Notes (Signed)
Patient initially seen and evaluated by Dr. Gwenlyn Fudge. She was sent for CT angiogram to rule out pulmonary embolism. CT angiogram has come back negative for pulmonary embolism. I'm going back to evaluate the patient she states that her pain has improved substantially but has not completely gone. She is resting comfortably. She is discharged with prescription for naproxen.  Results for orders placed during the hospital encounter of 02/12/13  CBC      Result Value Range   WBC 9.0  4.0 - 10.5 K/uL   RBC 4.39  3.87 - 5.11 MIL/uL   Hemoglobin 12.6  12.0 - 15.0 g/dL   HCT 47.4  25.9 - 56.3 %   MCV 84.3  78.0 - 100.0 fL   MCH 28.7  26.0 - 34.0 pg   MCHC 34.1  30.0 - 36.0 g/dL   RDW 87.5  64.3 - 32.9 %   Platelets 281  150 - 400 K/uL  BASIC METABOLIC PANEL      Result Value Range   Sodium 141  135 - 145 mEq/L   Potassium 3.8  3.5 - 5.1 mEq/L   Chloride 104  96 - 112 mEq/L   CO2 27  19 - 32 mEq/L   Glucose, Bld 113 (*) 70 - 99 mg/dL   BUN 9  6 - 23 mg/dL   Creatinine, Ser 5.18  0.50 - 1.10 mg/dL   Calcium 9.1  8.4 - 84.1 mg/dL   GFR calc non Af Amer >90  >90 mL/min   GFR calc Af Amer >90  >90 mL/min  D-DIMER, QUANTITATIVE      Result Value Range   D-Dimer, Quant 0.58 (*) 0.00 - 0.48 ug/mL-FEU  POCT I-STAT TROPONIN I      Result Value Range   Troponin i, poc 0.00  0.00 - 0.08 ng/mL   Comment 3           POCT PREGNANCY, URINE      Result Value Range   Preg Test, Ur NEGATIVE  NEGATIVE   Dg Chest 2 View  02/12/2013   CLINICAL DATA:  Right chest pain, shortness of Breath.  EXAM: CHEST - 2 VIEW  COMPARISON:  01/07/2007  FINDINGS: Linear scarring or subsegmental atelectasis in basilar segments of the left lower lobe. Right lung clear. No pneumothorax or effusion. Heart size upper limits normal. Regional bones unremarkable.  IMPRESSION: 1. Minimal linear scar or atelectasis, left lower lobe.   Electronically Signed   By: Oley Balm M.D.   On: 02/12/2013 18:20   Ct Angio Chest Pe W/cm  &/or Wo Cm  02/13/2013   CLINICAL DATA:  Right-sided chest pain  EXAM: CT ANGIOGRAPHY CHEST WITH CONTRAST  TECHNIQUE: Multidetector CT imaging of the chest was performed using the standard protocol during bolus administration of intravenous contrast. Multiplanar CT image reconstructions including MIPs were obtained to evaluate the vascular anatomy.  CONTRAST:  50mL OMNIPAQUE IOHEXOL 350 MG/ML SOLN  COMPARISON:  None.  FINDINGS: THORACIC INLET/BODY WALL:  No acute abnormality.  MEDIASTINUM:  Normal heart size. No pericardial effusion. No acute vascular abnormality, including pulmonary embolism. Small sliding-type hiatal hernia. No adenopathy.  LUNG WINDOWS:  Lower lobe atelectasis, mild, without consolidation. No effusion. No suspicious pulmonary nodule.  UPPER ABDOMEN:  No acute findings.  OSSEOUS:  No acute fracture.  No suspicious lytic or blastic lesions.  Review of the MIP images confirms the above findings.  IMPRESSION: 1. Negative for pulmonary embolism or other acute intrathoracic disease. 2. Small hiatal hernia.  Electronically Signed   By: Tiburcio Pea M.D.   On: 02/13/2013 01:11      Dione Booze, MD 02/13/13 [phone]

## 2013-02-13 NOTE — ED Notes (Signed)
Pt taken to CT.

## 2013-07-10 ENCOUNTER — Emergency Department (HOSPITAL_COMMUNITY)
Admission: EM | Admit: 2013-07-10 | Discharge: 2013-07-10 | Disposition: A | Discharge status: Home / Self Care | Payer: Medicaid Other | Attending: Emergency Medicine | Admitting: Emergency Medicine

## 2013-07-10 ENCOUNTER — Emergency Department (HOSPITAL_COMMUNITY): Payer: Medicaid Other

## 2013-07-10 ENCOUNTER — Encounter (HOSPITAL_COMMUNITY): Payer: Self-pay | Admitting: Emergency Medicine

## 2013-07-10 DIAGNOSIS — M545 Low back pain, unspecified: Secondary | ICD-10-CM | POA: Insufficient documentation

## 2013-07-10 DIAGNOSIS — R112 Nausea with vomiting, unspecified: Secondary | ICD-10-CM | POA: Insufficient documentation

## 2013-07-10 DIAGNOSIS — Z8669 Personal history of other diseases of the nervous system and sense organs: Secondary | ICD-10-CM | POA: Insufficient documentation

## 2013-07-10 DIAGNOSIS — E669 Obesity, unspecified: Secondary | ICD-10-CM | POA: Insufficient documentation

## 2013-07-10 DIAGNOSIS — R51 Headache: Secondary | ICD-10-CM | POA: Insufficient documentation

## 2013-07-10 DIAGNOSIS — Z3202 Encounter for pregnancy test, result negative: Secondary | ICD-10-CM | POA: Insufficient documentation

## 2013-07-10 DIAGNOSIS — R519 Headache, unspecified: Secondary | ICD-10-CM

## 2013-07-10 DIAGNOSIS — F17200 Nicotine dependence, unspecified, uncomplicated: Secondary | ICD-10-CM | POA: Insufficient documentation

## 2013-07-10 LAB — MAGNESIUM: Magnesium: 1.6 mg/dL (ref 1.5–2.5)

## 2013-07-10 LAB — COMPREHENSIVE METABOLIC PANEL
ALT: 20 U/L (ref 0–35)
AST: 26 U/L (ref 0–37)
Albumin: 3.2 g/dL — ABNORMAL LOW (ref 3.5–5.2)
Alkaline Phosphatase: 36 U/L — ABNORMAL LOW (ref 39–117)
BUN: 6 mg/dL (ref 6–23)
CALCIUM: 8.4 mg/dL (ref 8.4–10.5)
CO2: 23 meq/L (ref 19–32)
CREATININE: 0.6 mg/dL (ref 0.50–1.10)
Chloride: 103 mEq/L (ref 96–112)
GLUCOSE: 118 mg/dL — AB (ref 70–99)
Potassium: 3.9 mEq/L (ref 3.7–5.3)
SODIUM: 139 meq/L (ref 137–147)
TOTAL PROTEIN: 6.7 g/dL (ref 6.0–8.3)
Total Bilirubin: <0.2 mg/dL — ABNORMAL LOW (ref 0.3–1.2)

## 2013-07-10 LAB — URINALYSIS, ROUTINE W REFLEX MICROSCOPIC
BILIRUBIN URINE: NEGATIVE
Glucose, UA: NEGATIVE mg/dL
Hgb urine dipstick: NEGATIVE
Ketones, ur: NEGATIVE mg/dL
NITRITE: NEGATIVE
PH: 6.5 (ref 5.0–8.0)
Protein, ur: NEGATIVE mg/dL
SPECIFIC GRAVITY, URINE: 1.008 (ref 1.005–1.030)
UROBILINOGEN UA: 1 mg/dL (ref 0.0–1.0)

## 2013-07-10 LAB — CBC WITH DIFFERENTIAL/PLATELET
BASOS PCT: 1 % (ref 0–1)
Basophils Absolute: 0.1 10*3/uL (ref 0.0–0.1)
EOS ABS: 0.1 10*3/uL (ref 0.0–0.7)
EOS PCT: 2 % (ref 0–5)
HCT: 35.7 % — ABNORMAL LOW (ref 36.0–46.0)
HEMOGLOBIN: 11.8 g/dL — AB (ref 12.0–15.0)
LYMPHS ABS: 2 10*3/uL (ref 0.7–4.0)
Lymphocytes Relative: 31 % (ref 12–46)
MCH: 27.7 pg (ref 26.0–34.0)
MCHC: 33.1 g/dL (ref 30.0–36.0)
MCV: 83.8 fL (ref 78.0–100.0)
MONO ABS: 0.6 10*3/uL (ref 0.1–1.0)
Monocytes Relative: 9 % (ref 3–12)
NEUTROS PCT: 57 % (ref 43–77)
Neutro Abs: 3.6 10*3/uL (ref 1.7–7.7)
Platelets: 302 10*3/uL (ref 150–400)
RBC: 4.26 MIL/uL (ref 3.87–5.11)
RDW: 15.3 % (ref 11.5–15.5)
WBC: 6.4 10*3/uL (ref 4.0–10.5)

## 2013-07-10 LAB — PREGNANCY, URINE: PREG TEST UR: NEGATIVE

## 2013-07-10 LAB — URINE MICROSCOPIC-ADD ON

## 2013-07-10 MED ORDER — SODIUM CHLORIDE 0.9 % IV BOLUS (SEPSIS)
1000.0000 mL | Freq: Once | INTRAVENOUS | Status: AC
Start: 2013-07-10 — End: 2013-07-10
  Administered 2013-07-10: 1000 mL via INTRAVENOUS

## 2013-07-10 MED ORDER — ONDANSETRON HCL 4 MG PO TABS: 4.0000 mg | ORAL_TABLET | Freq: Four times a day (QID) | ORAL | Status: DC

## 2013-07-10 MED ORDER — TRAMADOL HCL 50 MG PO TABS: 50.0000 mg | ORAL_TABLET | Freq: Four times a day (QID) | ORAL | Status: DC | PRN

## 2013-07-10 MED ORDER — ONDANSETRON HCL 4 MG/2ML IJ SOLN
4.0000 mg | Freq: Once | INTRAMUSCULAR | Status: AC
  Administered 2013-07-10: 4 mg via INTRAVENOUS
  Filled 2013-07-10: qty 2

## 2013-07-10 MED ORDER — SODIUM CHLORIDE 0.9 % IV BOLUS (SEPSIS)
1000.0000 mL | Freq: Once | INTRAVENOUS | Status: AC
  Administered 2013-07-10: 1000 mL via INTRAVENOUS

## 2013-07-10 MED ORDER — KETOROLAC TROMETHAMINE 30 MG/ML IJ SOLN
30.0000 mg | Freq: Once | INTRAMUSCULAR | Status: AC
Start: 2013-07-10 — End: 2013-07-10
  Administered 2013-07-10: 30 mg via INTRAVENOUS
  Filled 2013-07-10: qty 1

## 2013-07-10 NOTE — ED Notes (Signed)
Pt reports headaches and vomiting and overall not feeling well since sunday

## 2013-07-10 NOTE — ED Notes (Signed)
Pt reports headache has improved but is still there. Pt resting comfortably in bed with eyes closed and lights out. Updated pt on plan of care. Pt agreeable

## 2013-07-10 NOTE — ED Notes (Signed)
Verbal order from Dr. Lavella LemonsManly that patient does not need to be placed on droplet precautions.

## 2013-07-10 NOTE — ED Notes (Signed)
Pt reports that hx of meningitis and this feels the exact same

## 2013-07-10 NOTE — ED Notes (Signed)
Patient also c/o of generalized body aches.

## 2013-07-10 NOTE — ED Provider Notes (Signed)
CSN: [id number]     Arrival date & time 07/10/13  0402 History   First MD Initiated Contact with Patient 07/10/13 [phone]     Chief Complaint  Patient presents with  . Headache  . Emesis     (Consider location/radiation/quality/duration/timing/severity/associated sxs/prior Treatment) HPI Patient is a 33 yo woman who presents with complaints of diffuse headache and low back pain for 4 days. She has had about 24 hrs of nausea, vomiting and inability to keep down fluids. No diarrhea, abdominal pain or fever. No respiratory sx. No GU sx and no known ill contacts.   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 The patient denies any permanent sequela of previous episodes of meningitis. She says she had a fever on both previous occasions of meningitis. No ill contact. No rash or skin lesions.   Pain is aching, moderately severe.  Headache is worse when the patient moves her head from side to side. She says her sx feel similar to when she had viral meningitis 10 years ago and 8 years ago, respectively. The patient drove herself to the ED.   Past Medical History  Diagnosis Date  . Meningitis   . Obesity (BMI 35.0-39.9 without comorbidity)    History reviewed. No pertinent past surgical history. Family History  Problem Relation Age of Onset  . Diabetes Mother    History  Substance Use Topics  . Smoking status: Current Some Day Smoker  . Smokeless tobacco: Never Used  . Alcohol Use: Yes     Comment: socially   OB History   Grav Para Term Preterm Abortions TAB SAB Ect Mult Living   2 2 2       2      Review of Systems  Ten point review of symptoms performed and is negative with the exception of symptoms noted above.    Allergies  Review of patient's allergies indicates no known allergies.  Home Medications   Current Outpatient Rx  Name  Route  Sig   Dispense  Refill  . ibuprofen (ADVIL,MOTRIN) 200 MG tablet   Oral   Take 800 mg by mouth every 4 (four) hours as needed for mild pain.         . naproxen sodium (ANAPROX) 220 MG tablet   Oral   Take 440 mg by mouth at bedtime.          BP 121/55  Pulse 93  Temp(Src) 98.5 F (36.9 C) (Oral)  Resp 16  Ht 5\' 2"  (1.575 m)  Wt 215 lb (97.523 kg)  BMI 39.31 kg/m2  SpO2 99% Physical Exam Gen: well developed and well nourished appearing, alert, oriented x 4 and appropriate Head: NCAT Eyes: PERL, EOMI Nose: no epistaixis or rhinorrhea Mouth/throat: mucosa is moist and pink Neck: supple, no stridor, ttp on palpation of trapezius m. bilaterally Lungs: CTA B, no wheezing, rhonchi or rales CV: RRR - pulse 88 to 92, no murmur, extremities appear well perfused.  Abd: soft, notender, nondistended Back: no ttp, no cva ttp Skin: warm and dry Ext: normal to inspection, no dependent edema Neuro: CN ii-xii grossly intact, no focal deficits, normal gait, normal speech, normal finger to nose, negative Kernig's and Brudinski's signs.  Psyche; normal affect,  calm and cooperative.   ED Course  Procedures (including critical care time) Labs Review  Results for orders placed  during the hospital encounter of 07/10/13 (from the past 24 hour(s))  CBC WITH DIFFERENTIAL     Status: Abnormal (Preliminary result)   Collection Time    07/10/13  4:30 AM      Result Value Ref Range   WBC PENDING  4.0 - 10.5 K/uL   RBC 4.26  3.87 - 5.11 MIL/uL   Hemoglobin 11.8 (*) 12.0 - 15.0 g/dL   HCT 16.135.7 (*) 09.636.0 - 04.546.0 %   MCV 83.8  78.0 - 100.0 fL   MCH 27.7  26.0 - 34.0 pg   MCHC 33.1  30.0 - 36.0 g/dL   RDW 40.915.3  81.111.5 - 91.415.5 %   Platelets 302  150 - 400 K/uL   Neutrophils Relative % PENDING  43 - 77 %   Neutro Abs PENDING  1.7 - 7.7 K/uL   Band Neutrophils PENDING  0 - 10 %   Lymphocytes Relative PENDING  12 - 46 %   Lymphs Abs PENDING  0.7 - 4.0 K/uL   Monocytes Relative PENDING  3 - 12 %    Monocytes Absolute PENDING  0.1 - 1.0 K/uL   Eosinophils Relative PENDING  0 - 5 %   Eosinophils Absolute PENDING  0.0 - 0.7 K/uL   Basophils Relative PENDING  0 - 1 %   Basophils Absolute PENDING  0.0 - 0.1 K/uL   WBC Morphology PENDING     RBC Morphology PENDING     Smear Review PENDING     nRBC PENDING  0 /100 WBC   Metamyelocytes Relative PENDING     Myelocytes PENDING     Promyelocytes Absolute PENDING     Blasts PENDING    COMPREHENSIVE METABOLIC PANEL     Status: Abnormal   Collection Time    07/10/13  4:30 AM      Result Value Ref Range   Sodium 139  137 - 147 mEq/L   Potassium 3.9  3.7 - 5.3 mEq/L   Chloride 103  96 - 112 mEq/L   CO2 23  19 - 32 mEq/L   Glucose, Bld 118 (*) 70 - 99 mg/dL   BUN 6  6 - 23 mg/dL   Creatinine, Ser 7.820.60  0.50 - 1.10 mg/dL   Calcium 8.4  8.4 - 95.610.5 mg/dL   Total Protein 6.7  6.0 - 8.3 g/dL   Albumin 3.2 (*) 3.5 - 5.2 g/dL   AST 26  0 - 37 U/L   ALT 20  0 - 35 U/L   Alkaline Phosphatase 36 (*) 39 - 117 U/L   Total Bilirubin <0.2 (*) 0.3 - 1.2 mg/dL   GFR calc non Af Amer >90  >90 mL/min   GFR calc Af Amer >90  >90 mL/min  MAGNESIUM     Status: None   Collection Time    07/10/13  4:30 AM      Result Value Ref Range   Magnesium 1.6  1.5 - 2.5 mg/dL   CT Head Wo Contrast (Final result)  Result time: 07/10/13 05:12:40    Final result by Rad Results In Interface (07/10/13 05:12:40)    Narrative:   CLINICAL DATA: Headaches and vomiting  EXAM: CT HEAD WITHOUT CONTRAST  TECHNIQUE: Contiguous axial images were obtained from the base of the skull through the vertex without intravenous contrast.  COMPARISON: Prior CT from 03/31/2011  FINDINGS: There is no acute intracranial hemorrhage or infarct. No mass lesion or midline shift. Gray-white matter differentiation is well maintained. Ventricles  are normal in size without evidence of hydrocephalus. CSF containing spaces are within normal limits. No extra-axial fluid collection.  The  calvarium is intact.  Orbital soft tissues are within normal limits.  The paranasal sinuses and mastoid air cells are well pneumatized and free of fluid.  Scalp soft tissues are unremarkable.  IMPRESSION: No acute intracranial process identified.   Electronically Signed By: Rise Mu M.D. On: 07/10/2013 05:12     MDM    Patient feeling better. Tolerating po intake. Asking to go home. Headache has resolved.      Brandt Loosen, MD 07/10/13 [phone]

## 2014-02-16 ENCOUNTER — Encounter (HOSPITAL_COMMUNITY): Payer: Self-pay | Admitting: Emergency Medicine

## 2014-06-17 ENCOUNTER — Emergency Department (HOSPITAL_COMMUNITY)
Admission: EM | Admit: 2014-06-17 | Discharge: 2014-06-17 | Disposition: A | Discharge status: Home / Self Care | Payer: BLUE CROSS/BLUE SHIELD | Attending: Emergency Medicine | Admitting: Emergency Medicine

## 2014-06-17 ENCOUNTER — Encounter (HOSPITAL_COMMUNITY): Payer: Self-pay | Admitting: Emergency Medicine

## 2014-06-17 DIAGNOSIS — R21 Rash and other nonspecific skin eruption: Secondary | ICD-10-CM | POA: Diagnosis not present

## 2014-06-17 DIAGNOSIS — Z8669 Personal history of other diseases of the nervous system and sense organs: Secondary | ICD-10-CM | POA: Insufficient documentation

## 2014-06-17 DIAGNOSIS — Z720 Tobacco use: Secondary | ICD-10-CM | POA: Insufficient documentation

## 2014-06-17 DIAGNOSIS — Z79899 Other long term (current) drug therapy: Secondary | ICD-10-CM | POA: Diagnosis not present

## 2014-06-17 DIAGNOSIS — E669 Obesity, unspecified: Secondary | ICD-10-CM | POA: Insufficient documentation

## 2014-06-17 DIAGNOSIS — R51 Headache: Secondary | ICD-10-CM | POA: Diagnosis not present

## 2014-06-17 DIAGNOSIS — R509 Fever, unspecified: Secondary | ICD-10-CM | POA: Diagnosis not present

## 2014-06-17 MED ORDER — SALICYLIC ACID 17 % EX SOLN
Freq: Every day | CUTANEOUS | Status: DC
Start: 2014-06-17 — End: 2015-07-24

## 2014-06-17 MED ORDER — HYDROCORTISONE 1 % EX CREA: TOPICAL_CREAM | CUTANEOUS | Status: DC

## 2014-06-17 NOTE — Discharge Instructions (Signed)
Return to the emergency room with worsening of symptoms, new symptoms or with symptoms that are concerning, especially redness, swelling, red streaks or worsening symptoms. Call the wellness center to establish care and follow up for rash. Hydrocortisone cream to ichy back. Salicylic acid to right wrist. Warm compresses to behind right ear. You may also take either allegra, Zyrtec or Claritin for itchiness daily. Read below information and follow recommendations. Salicylic Acid topical gel, cream, lotion, solution What is this medicine? SALICYCLIC ACID (SAL i SIL ik AS id) breaks down layers of thick skin. It is used to treat common and plantar warts, psoriasis, calluses, and corns. It is also used to treat or to prevent acne. This medicine may be used for other purposes; ask your health care provider or pharmacist if you have questions. COMMON BRAND NAME(S): Azucena FallenAkurza, Clear Away Liquid, Compound W, Corn/Callus Remover, Dermarest Psoriasis Moisturizer, Dermarest Psoriasis Overnight Treatment, Dermarest Psoriasis Scalp Treatment, Dermarest Psoriasis Skin Treatment, DuoFilm Wart Remover, Gordofilm, Hydrisalic, Keralyt, Neutrogena Acne Wash, Cud[place] KeyOcclusal-HP, RE SA, [address]SalAC, Celanese CorporationSalactic Film, MartinSalacyn, [place]Salex, [place]Salitop, [place]Scalpicin 2 in 1 Anti-Dandruff, P[place] HighlandsUltraSal-ER, IndianolaVIRASAL, Wart-Off What should I tell my health care provider before I take this medicine? They need to know if you have any of these conditions: -child with chickenpox, the flu, or other viral infection -kidney disease -liver disease -an unusual or allergic reaction to salicylic acid, other medicines, foods, dyes, or preservatives -pregnant or trying to get pregnant -breast-feeding How should I use this medicine? This medicine is for external use only. Follow the directions on the label. Do not apply to raw or irritated skin. Avoid getting medicine in your eyes, lips, nose, mouth, or other sensitive areas. Use this medicine at regular intervals. Do not  use more often than directed. Talk to your pediatrician regarding the use of this medicine in children. Special care may be needed. This medicine is not approved for use in children under 34 years old. Overdosage: If you think you have taken too much of this medicine contact a poison control center or emergency room at once. NOTE: This medicine is only for you. Do not share this medicine with others. What if I miss a dose? If you miss a dose, use it as soon as you can. If it is almost time for your next dose, use only that dose. Do not use double or extra doses. What may interact with this medicine? -medicines that change urine pH like ammonium chloride, sodium bicarbonate, and others -medicines that treat or prevent blood clots like warfarin -methotrexate -pyrazinamide -some medicines for diabetes -some medicines for gout -steroid medicines like prednisone or cortisone This list may not describe all possible interactions. Give your health care provider a list of all the medicines, herbs, non-prescription drugs, or dietary supplements you use. Also tell them if you smoke, drink alcohol, or use illegal drugs. Some items may interact with your medicine. What should I watch for while using this medicine? Tell your doctor is your symptoms do not get better or if they get worse. This medicine can make you more sensitive to the sun. Keep out of the sun. If you cannot avoid being in the sun, wear protective clothing and use sunscreen. Do not use sun lamps or tanning beds/booths. Use of this medicine in children under 12 years or in patients with kidney or liver disease may increase the risk of serious side effects. These patients should not use this medicine over large areas of skin. If you notice symptoms such as nausea,  vomiting, dizziness, loss of hearing, ringing in the ears, unusual weakness or tiredness, fast or labored breathing, diarrhea, or confusion, stop using this medicine and contact your  doctor or health care professional. What side effects may I notice from receiving this medicine? Side effects that you should report to your doctor or health care professional as soon as possible: -allergic reactions like skin rash, itching or hives, swelling of the face, lips, or tongue Side effects that usually do not require medical attention (report to your doctor or health care professional if they continue or are bothersome): -skin irritation This list may not describe all possible side effects. Call your doctor for medical advice about side effects. You may report side effects to FDA at 1-800-FDA-1088. Where should I keep my medicine? Keep out of the reach of children. Store at room temperature between 15 and 30 degrees C (59 and 86 degrees F). Do not freeze. Throw away any unused medicine after the expiration date. NOTE: This sheet is a summary. It may not cover all possible information. If you have questions about this medicine, talk to your doctor, pharmacist, or health care provider.  2015, Elsevier/Gold Standard. (2007-12-06 13:36:20)  Warts Warts are a common viral infection. They are most commonly caused by the human papillomavirus (HPV). Warts can occur at all ages. However, they occur most frequently in older children and infrequently in the elderly. Warts may be single or multiple. Location and size varies. Warts can be spread by scratching the wart and then scratching normal skin. The life cycle of warts varies. However, most will disappear over many months to a couple years. Warts commonly do not cause problems (asymptomatic) unless they are over an area of pressure, such as the bottom of the foot. If they are large enough, they may cause pain with walking. DIAGNOSIS  Warts are most commonly diagnosed by their appearance. Tissue samples (biopsies) are not required unless the wart looks abnormal. Most warts have a rough surface, are round, oval, or irregular, and are skin-colored  to light yellow, brown, or gray. They are generally less than  inch (1.3 cm), but they can be any size. TREATMENT   Observation or no treatment.  Freezing with liquid nitrogen.  High heat (cautery).  Boosting the body's immunity to fight off the wart (immunotherapy using Candida antigen).  Laser surgery.  Application of various irritants and solutions. HOME CARE INSTRUCTIONS  Follow your caregiver's instructions. No special precautions are necessary. Often, treatment may be followed by a return (recurrence) of warts. Warts are generally difficult to treat and get rid of. If treatment is done in a clinic setting, usually more than 1 treatment is required. This is usually done on only a monthly basis until the wart is completely gone. SEEK IMMEDIATE MEDICAL CARE IF: The treated skin becomes red, puffy (swollen), or painful. Document Released: 01/11/2005 Document Revised: 07/29/2012 Document Reviewed: 07/09/2009 [facility] Patient Information 2015 [place], [place]. This information is not intended to replace advice given to you by your health care provider. Make sure you discuss any questions you have with your health care provider.

## 2014-06-17 NOTE — ED Provider Notes (Signed)
CSN: [id number]     Arrival date & time 06/17/14  9562 History  This chart was scribed for Oswaldo Conroy, PA-C working with No att. providers found by Elveria Rising, ED Scribe. This patient was seen in room TR08C/TR08C and the patient's care was started at 9:34 AM.   Chief Complaint  Patient presents with  . Rash   The history is provided by the patient. No language interpreter was used.   HPI Comments: Sandra Mcknight is a 34 y.o. female who presents to the Emergency Department complaining of rash, onset two days ago. Patient locates initial presentation to right anterior wrist; patient denies pain or itching. Patient reports that she can feel pruritic rash on her back   Patient reports associated fever measured at 101F yesterday and chills since. Patient denies discharge at any site. Patient denies introduction of new dermatological products, plant/insect exposure, or new foods.    Past Medical History  Diagnosis Date  . Meningitis   . Obesity (BMI 35.0-39.9 without comorbidity)    History reviewed. No pertinent past surgical history. Family History  Problem Relation Age of Onset  . Diabetes Mother    History  Substance Use Topics  . Smoking status: Current Some Day Smoker  . Smokeless tobacco: Never Used  . Alcohol Use: Yes     Comment: socially   OB History    Gravida Para Term Preterm AB TAB SAB Ectopic Multiple Living   Review of Systems  Constitutional: Positive for fever. Negative for chills.  HENT: Negative for rhinorrhea, sore throat and trouble swallowing.   Respiratory: Negative for cough and shortness of breath.   Cardiovascular: Negative for chest pain.  Skin: Positive for color change and rash.  Neurological: Positive for headaches.    Allergies  Review of patient's allergies indicates no known allergies.  Home Medications   Prior to Admission medications   Medication Sig Start Date End Date Taking? Authorizing Provider  cetirizine  (ZYRTEC) 10 MG tablet Take 1 tablet (10 mg total) by mouth daily. 06/18/14   Rodolph Bong, MD  hydrocortisone cream 1 % Apply to affected area 2 times daily 06/17/14   Louann Sjogren, PA-C  hydrOXYzine (ATARAX/VISTARIL) 25 MG tablet Take 1 tablet (25 mg total) by mouth every 6 (six) hours as needed for itching (rash). 06/18/14   Rodolph Bong, MD  ibuprofen (ADVIL,MOTRIN) 200 MG tablet Take 800 mg by mouth every 4 (four) hours as needed for mild pain.    Historical Provider, MD  naproxen sodium (ANAPROX) 220 MG tablet Take 440 mg by mouth at bedtime.    Historical Provider, MD  ranitidine (ZANTAC) 150 MG capsule Take 1 capsule (150 mg total) by mouth 2 (two) times daily. 06/18/14   Rodolph Bong, MD  salicylic acid-lactic acid 17 % external solution Apply topically daily. 06/17/14   Louann Sjogren, PA-C  triamcinolone cream (KENALOG) 0.1 % Apply 1 application topically 2 (two) times daily. 06/18/14   Rodolph Bong, MD   Triage Vitals: BP 130/82 mmHg  Pulse 100  Temp(Src) 98 F (36.7 C) (Oral)  Resp 16  SpO2 100% Physical Exam  Constitutional: She appears well-developed and well-nourished. No distress.  HENT:  Head: Normocephalic and atraumatic.  Eyes: Conjunctivae are normal. Right eye exhibits no discharge. Left eye exhibits no discharge.  Pulmonary/Chest: Effort normal. No respiratory distress.  Neurological: She is alert. Coordination normal.  Skin: Rash noted. She is not diaphoretic.  Macular rash diffusely to back. Small raised circular lesion to right palmar wrist. 0.5 cm small cyst like structure behind right ear. No drainage. No erythema or evidence of infection.  Nothing behind left. No visualized rash to face.  Psychiatric: She has a normal mood and affect. Her behavior is normal.  Nursing note and vitals reviewed.   ED Course  Procedures (including critical care time)  COORDINATION OF CARE: 9:38 AM- Plans to prescribe hydrocortisone for rash to back. Patient daviDiscussed treatment  plan with patient at bedside and patient agreed to plan.   Labs Review Labs Reviewed - No data to display  Imaging Review No results found.   EKG Interpretation None      MDM   Final diagnoses:  Rash   Pt to treat rash with hydrocortisone on back as well as salicylic acid for possible wart to right wrist. Pt reports fever but is afebrile in ED. No other systemic symptoms.  Zyrtec, claritin or allegra for pruritis. Pt to follow up with PCP for persistent symptoms.  Discussed return precautions with patient. Discussed all results and patient verbalizes understanding and agrees with plan.  I personally performed the services described in this documentation, which was scribed in my presence. The recorded information has been reviewed and is accurate.   Louann SjogrenVictoria L Tanzania Basham, PA-C 06/19/14 1610  Vanetta MuldersScott Zackowski, MD 06/24/14 [phone]

## 2014-06-17 NOTE — ED Notes (Signed)
Patient states noticed rash on inside of wrists, and face and behind ears x 2 days ago.  Patient states she was running a fever 2 x days ago of 101.   Patient denies other symptoms.

## 2014-06-18 ENCOUNTER — Emergency Department (HOSPITAL_COMMUNITY)
Admission: EM | Admit: 2014-06-18 | Discharge: 2014-06-18 | Disposition: A | Discharge status: Home / Self Care | Payer: BLUE CROSS/BLUE SHIELD | Source: Home / Self Care | Attending: Family Medicine | Admitting: Family Medicine

## 2014-06-18 ENCOUNTER — Encounter (HOSPITAL_COMMUNITY): Payer: Self-pay | Admitting: Emergency Medicine

## 2014-06-18 DIAGNOSIS — L509 Urticaria, unspecified: Secondary | ICD-10-CM

## 2014-06-18 MED ORDER — RANITIDINE HCL 150 MG PO CAPS: 150.0000 mg | ORAL_CAPSULE | Freq: Two times a day (BID) | ORAL | Status: DC

## 2014-06-18 MED ORDER — CETIRIZINE HCL 10 MG PO TABS: 10.0000 mg | ORAL_TABLET | Freq: Every day | ORAL | Status: DC

## 2014-06-18 MED ORDER — TRIAMCINOLONE ACETONIDE 0.1 % EX CREA
1.0000 | TOPICAL_CREAM | Freq: Two times a day (BID) | CUTANEOUS | Status: DC
Start: 2014-06-18 — End: 2015-07-24

## 2014-06-18 MED ORDER — HYDROXYZINE HCL 25 MG PO TABS: 25.0000 mg | ORAL_TABLET | Freq: Four times a day (QID) | ORAL | Status: DC | PRN

## 2014-06-18 NOTE — Discharge Instructions (Signed)
Thank you for coming in today. Take cetirizine daily and ranitidine twice daily to prevent rash. Use hydroxyzine if the rash returns. This medicine will make you sleepy. Used triamcinolone cream as needed. Return as needed. Go to the emergency room if you get worse. Please follow-up with a primary care provider.  Hives Hives are itchy, red, swollen areas of the skin. They can vary in size and location on your body. Hives can come and go for hours or several days (acute hives) or for several weeks (chronic hives). Hives do not spread from person to person (noncontagious). They may get worse with scratching, exercise, and emotional stress. CAUSES   Allergic reaction to food, additives, or drugs.  Infections, including the common cold.  Illness, such as vasculitis, lupus, or thyroid disease.  Exposure to sunlight, heat, or cold.  Exercise.  Stress.  Contact with chemicals. SYMPTOMS   Red or white swollen patches on the skin. The patches may change size, shape, and location quickly and repeatedly.  Itching.  Swelling of the hands, feet, and face. This may occur if hives develop deeper in the skin. DIAGNOSIS  Your caregiver can usually tell what is wrong by performing a physical exam. Skin or blood tests may also be done to determine the cause of your hives. In some cases, the cause cannot be determined. TREATMENT  Mild cases usually get better with medicines such as antihistamines. Severe cases may require an emergency epinephrine injection. If the cause of your hives is known, treatment includes avoiding that trigger.  HOME CARE INSTRUCTIONS   Avoid causes that trigger your hives.  Take antihistamines as directed by your caregiver to reduce the severity of your hives. Non-sedating or low-sedating antihistamines are usually recommended. Do not drive while taking an antihistamine.  Take any other medicines prescribed for itching as directed by your caregiver.  Wear  loose-fitting clothing.  Keep all follow-up appointments as directed by your caregiver. SEEK MEDICAL CARE IF:   You have persistent or severe itching that is not relieved with medicine.  You have painful or swollen joints. SEEK IMMEDIATE MEDICAL CARE IF:   You have a fever.  Your tongue or lips are swollen.  You have trouble breathing or swallowing.  You feel tightness in the throat or chest.  You have abdominal pain. These problems may be the first sign of a life-threatening allergic reaction. Call your local emergency services (911 in [place] MAKE SURE YOU:   Understand these instructions.  Will watch your condition.  Will get help right away if you are not doing well or get worse. Document Released: 04/03/2005 Document Revised: 04/08/2013 Document Reviewed: 06/27/2011 [facility] Patient Information 2015 [place], [place]. This information is not intended to replace advice given to you by your health care provider. Make sure you discuss any questions you have with your health care provider.   PRIMARY CARE Merchant navy officerDOCTORS [facility] [address]  [place], [place] Ph [phone]  Fax [phone]  Nature conservation officerLeBauer HealthCare at [facility] [place] [address]  [place], [place] Ph [phone]  Fax [phone]  Nature conservation officerLeBauer HealthCare at [place] / [place] [phone]  [place], [place] Ph [phone]  Fax [phone]  [facility] [address]  [place], [place] Ph [phone]  Fax [phone]  [facility] [address]  [place], [place] Ph [phone]  Fax [phone]  Nature conservation officerLeBauer [facility]Care at [place] [address] [place], [place] Ph [phone]  Fax [phone]   [facility] [place] [address] [place] [address] Phone: [phone]   [facility] @ [facility] [address] [place] [address] Phone: [phone]   [facility] @ [place] [address] [place] [address] Phone: [phone]   [facility] @ [place] [address] [place] [address] Phone: [phone]   [facility] @ [place] [address] [place] [address] Phone: [phone] Fax: [phone]   [facility] @ [place] [address] [place] [address] Phone: [phone]   Dr. Maryelizabeth Rowan [address] [place] [address] [phone]
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
## 2015-04-18 NOTE — L&D Delivery Note (Signed)
Delivery Note At 1:02 AM a viable and healthy female was delivered via Vaginal, Spontaneous Delivery (Presentation: OA  ).  APGAR: 8, 9; weight pending   Placenta status: Spontaneous and grossly intact with 3 vessel Cord:  with the following complications: none  Anesthesia:  None, local with repair Episiotomy:  none Lacerations:  2nd degree midline perineal Suture Repair: 3.0 vicryl rapide Est. Blood Loss (mL):  150  Mom to postpartum.  Baby to Couplet care / Skin to Skin.  [facility]WILLIAMS,Sandra Canale 02/20/2016, 2:58 AM

## 2015-06-02 ENCOUNTER — Emergency Department (HOSPITAL_COMMUNITY)
Admission: EM | Admit: 2015-06-02 | Discharge: 2015-06-02 | Disposition: A | Discharge status: Home / Self Care | Payer: BLUE CROSS/BLUE SHIELD | Attending: Emergency Medicine | Admitting: Emergency Medicine

## 2015-06-02 ENCOUNTER — Encounter (HOSPITAL_COMMUNITY): Payer: Self-pay | Admitting: Emergency Medicine

## 2015-06-02 DIAGNOSIS — R519 Headache, unspecified: Secondary | ICD-10-CM

## 2015-06-02 DIAGNOSIS — Z8661 Personal history of infections of the central nervous system: Secondary | ICD-10-CM | POA: Insufficient documentation

## 2015-06-02 DIAGNOSIS — Z79899 Other long term (current) drug therapy: Secondary | ICD-10-CM | POA: Insufficient documentation

## 2015-06-02 DIAGNOSIS — M791 Myalgia: Secondary | ICD-10-CM | POA: Insufficient documentation

## 2015-06-02 DIAGNOSIS — R51 Headache: Secondary | ICD-10-CM | POA: Insufficient documentation

## 2015-06-02 DIAGNOSIS — Z3202 Encounter for pregnancy test, result negative: Secondary | ICD-10-CM | POA: Insufficient documentation

## 2015-06-02 DIAGNOSIS — R000 Tachycardia, unspecified: Secondary | ICD-10-CM | POA: Insufficient documentation

## 2015-06-02 DIAGNOSIS — E669 Obesity, unspecified: Secondary | ICD-10-CM | POA: Insufficient documentation

## 2015-06-02 DIAGNOSIS — F17210 Nicotine dependence, cigarettes, uncomplicated: Secondary | ICD-10-CM | POA: Insufficient documentation

## 2015-06-02 LAB — CBC
HCT: 35.4 % — ABNORMAL LOW (ref 36.0–46.0)
HEMOGLOBIN: 11.5 g/dL — AB (ref 12.0–15.0)
MCH: 26.4 pg (ref 26.0–34.0)
MCHC: 32.5 g/dL (ref 30.0–36.0)
MCV: 81.2 fL (ref 78.0–100.0)
Platelets: 317 10*3/uL (ref 150–400)
RBC: 4.36 MIL/uL (ref 3.87–5.11)
RDW: 17 % — ABNORMAL HIGH (ref 11.5–15.5)
WBC: 6.5 10*3/uL (ref 4.0–10.5)

## 2015-06-02 LAB — BASIC METABOLIC PANEL
ANION GAP: 9 (ref 5–15)
BUN: 5 mg/dL — ABNORMAL LOW (ref 6–20)
CHLORIDE: 110 mmol/L (ref 101–111)
CO2: 22 mmol/L (ref 22–32)
CREATININE: 0.65 mg/dL (ref 0.44–1.00)
Calcium: 8.8 mg/dL — ABNORMAL LOW (ref 8.9–10.3)
GFR calc non Af Amer: >60 mL/min (ref >60)
Glucose, Bld: 94 mg/dL (ref 65–99)
Potassium: 4.1 mmol/L (ref 3.5–5.1)
SODIUM: 141 mmol/L (ref 135–145)

## 2015-06-02 LAB — URINALYSIS, ROUTINE W REFLEX MICROSCOPIC
Bilirubin Urine: NEGATIVE
Glucose, UA: NEGATIVE mg/dL
HGB URINE DIPSTICK: NEGATIVE
KETONES UR: 15 mg/dL — AB
Nitrite: NEGATIVE
Protein, ur: 30 mg/dL — AB
SPECIFIC GRAVITY, URINE: 1.031 — AB (ref 1.005–1.030)
pH: 6.5 (ref 5.0–8.0)

## 2015-06-02 LAB — URINE MICROSCOPIC-ADD ON

## 2015-06-02 LAB — POC URINE PREG, ED: Preg Test, Ur: NEGATIVE

## 2015-06-02 MED ORDER — FENTANYL CITRATE (PF) 100 MCG/2ML IJ SOLN
50.0000 ug | Freq: Once | INTRAMUSCULAR | Status: AC
  Administered 2015-06-02: 50 ug via INTRAVENOUS
  Filled 2015-06-02: qty 2

## 2015-06-02 MED ORDER — SODIUM CHLORIDE 0.9 % IV BOLUS (SEPSIS)
1000.0000 mL | Freq: Once | INTRAVENOUS | Status: AC
  Administered 2015-06-02: 1000 mL via INTRAVENOUS

## 2015-06-02 MED ORDER — SODIUM CHLORIDE 0.9 % IV BOLUS (SEPSIS): 1000.0000 mL | Freq: Once | INTRAVENOUS | Status: DC

## 2015-06-02 MED ORDER — IBUPROFEN 800 MG PO TABS: 800.0000 mg | ORAL_TABLET | Freq: Three times a day (TID) | ORAL | Status: DC | PRN

## 2015-06-02 MED ORDER — KETOROLAC TROMETHAMINE 30 MG/ML IJ SOLN
30.0000 mg | Freq: Once | INTRAMUSCULAR | Status: AC
  Administered 2015-06-02: 30 mg via INTRAVENOUS
  Filled 2015-06-02: qty 1

## 2015-06-02 NOTE — ED Notes (Signed)
Patient states headache that comes and goes.  Patient states she does have some photophobia.  Patient states they started on Monday and generalized body aches that started Monday also.   Patient states she had a dizzy spell on Monday briefly, but resolved.   Patient states the headache and the body aches persisted.

## 2015-06-02 NOTE — ED Provider Notes (Signed)
CSN: [id number]     Arrival date & time 06/02/15  7829 History   First MD Initiated Contact with Patient 06/02/15 1008     Chief Complaint  Patient presents with  . Headache  . Generalized Body Aches     (Consider location/radiation/quality/duration/timing/severity/associated sxs/prior Treatment) HPI Patient presents to the Emergency Department complaining of headache and generalized body aches. Patient has a PMH significant for two previous viral meningitis infections. Patient states that she was cooking on Monday and all the sudden she had a dizzy spell where she felt very uneasy and had to sit down. She then states that she developed myalgias later that night. 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 Patient endorses worsening myalgias, and a headache that developed Tuesday. She endorses nausea, diarrhea, palpitations, poor appetite, chest pain that worsens with coughing, SOB that began on Monday accompanied by her myalgias. She is most concerned about the headache which she describes as increased pressure that is relieved by her placing her hands on her head and pressing down, and states that she is concerned that it felt like her prior episodes of meningitis. Her headache is accompanied by photophobia. Denies phonophobia or aura. She has taken ibuprofen for her headaches and myalgias and has found little to no relief. Patient endorses continued random dizzyness, weakness and lightheadedness, she endorses chills and episodes of diaphoresis. She endorses coughing. She denies abdominal pain, vomiting, syncope, fever, rash, sore throat, nasal congestion, ear pain, dysuria, hematochezia.  Past Medical History  Diagnosis Date  . Meningitis   . Obesity (BMI 35.0-39.9 without comorbidity) (HCC)    History reviewed. No pertinent past surgical history. Family History  Problem Relation Age of  Onset  . Diabetes Mother    Social History  Substance Use Topics  . Smoking status: Current Some Day Smoker    Types: Cigarettes  . Smokeless tobacco: Never Used  . Alcohol Use: Yes     Comment: socially   OB History    Gravida Para Term Preterm AB TAB SAB Ectopic Multiple Living   Review of Systems All other systems negative except as documented in the HPI. All pertinent positives and negatives as reviewed in the HPI.  Allergies  Review of patient's allergies indicates no known allergies.  Home Medications   Prior to Admission medications   Medication Sig Start Date End Date Taking? Authorizing Provider  ibuprofen (ADVIL,MOTRIN) 200 MG tablet Take 800 mg by mouth every 4 (four) hours as needed for mild pain.   Yes Historical Provider, MD  naproxen sodium (ANAPROX) 220 MG tablet Take 440 mg by mouth at bedtime.   Yes Historical Provider, MD  cetirizine (ZYRTEC) 10 MG tablet Take 1 tablet (10 mg total) by mouth daily. 06/18/14   Rodolph Bong, MD  salicylic acid-lactic acid 17 % external solution Apply topically daily. 06/17/14   Oswaldo Conroy, PA-C  triamcinolone cream (KENALOG) 0.1 % Apply 1 application topically 2 (two) times daily. 06/18/14   Rodolph Bong, MD   BP 112/60 mmHg  Pulse 77  Temp(Src) 97.7 F (36.5 C) (Oral)  Resp 18  Ht  (1.575 m)  Wt 97.523 kg  BMI 39.31 kg/m2  SpO2 100%  LMP 05/07/2015 Physical Exam  Constitutional: She is oriented to person, place, and  time. She appears well-developed and well-nourished. No distress.  HENT:  Head: Normocephalic and atraumatic.  Right Ear: External ear normal.  Left Ear: External ear normal.  Nose: Nose normal.  Mouth/Throat: Oropharynx is clear and moist. No oropharyngeal exudate.  Eyes: Conjunctivae and EOM are normal. Pupils are equal, round, and reactive to light. Right eye exhibits no discharge. Left eye exhibits no discharge.  Neck: Normal range of motion. Neck supple.  Cardiovascular:  Normal rate, regular rhythm, normal heart sounds and intact distal pulses.  Exam reveals no gallop and no friction rub.   No murmur heard. Tachycardic  Pulmonary/Chest: Effort normal and breath sounds normal. No respiratory distress. She has no wheezes. She has no rales. She exhibits no tenderness.  Abdominal: Soft. Bowel sounds are normal. She exhibits no distension and no mass. There is no tenderness. There is no rebound and no guarding.  Musculoskeletal: Normal range of motion.  Lymphadenopathy:    She has no cervical adenopathy.  Neurological: She is alert and oriented to person, place, and time. She has normal reflexes.  No nuchal rigidity  Skin: Skin is warm. No rash noted. She is diaphoretic (mildly). No erythema.  Psychiatric: She has a normal mood and affect. Her behavior is normal. Thought content normal.    ED Course  Procedures (including critical care time) Labs Review Labs Reviewed  BASIC METABOLIC PANEL - Abnormal; Notable for the following:    BUN 5 (*)    Calcium 8.8 (*)    All other components within normal limits  CBC - Abnormal; Notable for the following:    Hemoglobin 11.5 (*)    HCT 35.4 (*)    RDW 17.0 (*)    All other components within normal limits  URINALYSIS, ROUTINE W REFLEX MICROSCOPIC (NOT AT [facility]) - Abnormal; Notable for the following:    APPearance TURBID (*)    Specific Gravity, Urine 1.031 (*)    Ketones, ur 15 (*)    Protein, ur 30 (*)    Leukocytes, UA SMALL (*)    All other components within normal limits  URINE MICROSCOPIC-ADD ON - Abnormal; Notable for the following:    Squamous Epithelial / LPF TOO NUMEROUS TO COUNT (*)    Bacteria, UA FEW (*)    All other components within normal limits  CBG MONITORING, ED  POC URINE PREG, ED    Imaging Review No results found. I have personally reviewed and evaluated these images and lab results as part of my medical decision-making.   EKG Interpretation None     1308 Patient given IVF for  dehydration, patient had no relief of headache with tordol, so she was given fentanyl. Patient resting in bed, no new complaints at this time.  MDM   Final diagnoses:  None      Patient be discharged home.  Patient most likely has possible virus.  The patient does not have any signs of nuchal rigidity or meningismus.  The patient will be advised follow-up with her primary care Dr. told to increase her fluid intake.  Told to rest as much possible she has had no fevers.  Indicate any signs of meningitis.  Patient had concern for this, but do not feel this is the case at this time  Charlestine Night, PA-C 06/02/15 1501  Laurence Spates, MD 06/02/15 [phone]

## 2015-06-02 NOTE — Discharge Instructions (Signed)
Return here as needed.  Follow-up with your primary care Dr. increase your fluid intake and rest as much as possible °

## 2015-07-15 ENCOUNTER — Encounter: Payer: Self-pay | Admitting: Obstetrics & Gynecology

## 2015-07-15 ENCOUNTER — Encounter: Payer: Self-pay | Admitting: *Deleted

## 2015-07-15 ENCOUNTER — Ambulatory Visit (INDEPENDENT_AMBULATORY_CARE_PROVIDER_SITE_OTHER): Payer: Self-pay | Admitting: Obstetrics & Gynecology

## 2015-07-15 VITALS — BP 125/62 | HR 88 | Temp 98.5°F | Ht 62.0 in | Wt 206.2 lb

## 2015-07-15 DIAGNOSIS — N898 Other specified noninflammatory disorders of vagina: Secondary | ICD-10-CM

## 2015-07-15 DIAGNOSIS — A499 Bacterial infection, unspecified: Secondary | ICD-10-CM

## 2015-07-15 DIAGNOSIS — O26891 Other specified pregnancy related conditions, first trimester: Principal | ICD-10-CM

## 2015-07-15 DIAGNOSIS — B9689 Other specified bacterial agents as the cause of diseases classified elsewhere: Secondary | ICD-10-CM

## 2015-07-15 DIAGNOSIS — N926 Irregular menstruation, unspecified: Secondary | ICD-10-CM

## 2015-07-15 DIAGNOSIS — N760 Acute vaginitis: Secondary | ICD-10-CM

## 2015-07-15 DIAGNOSIS — O23591 Infection of other part of genital tract in pregnancy, first trimester: Secondary | ICD-10-CM

## 2015-07-15 DIAGNOSIS — Z113 Encounter for screening for infections with a predominantly sexual mode of transmission: Secondary | ICD-10-CM

## 2015-07-15 LAB — POCT URINALYSIS DIP (DEVICE)
GLUCOSE, UA: NEGATIVE mg/dL
KETONES UR: 40 mg/dL — AB
Nitrite: NEGATIVE
Protein, ur: NEGATIVE mg/dL
Urobilinogen, UA: 0.2 mg/dL (ref 0.0–1.0)
pH: 5.5 (ref 5.0–8.0)

## 2015-07-15 LAB — POCT PREGNANCY, URINE: Preg Test, Ur: POSITIVE — AB

## 2015-07-15 MED ORDER — METRONIDAZOLE 500 MG PO TABS: 500.0000 mg | ORAL_TABLET | Freq: Two times a day (BID) | ORAL | Status: DC

## 2015-07-15 NOTE — Progress Notes (Signed)
CLINIC ENCOUNTER NOTE  History:  35 y.o. [id number] here today for malodorous yellow discharge for 1.5 weeks.  Also missed her period. She denies any abnormal vaginal bleeding, pelvic pain or other concerns.   Past Medical History  Diagnosis Date  . Meningitis   . Obesity (BMI 35.0-39.9 without comorbidity) (HCC)     History reviewed. No pertinent past surgical history.  The following portions of the patient's history were reviewed and updated as appropriate: allergies, current medications, past family history, past medical history, past social history, past surgical history and problem list.   Review of Systems:  Pertinent items noted in HPI and remainder of comprehensive ROS otherwise negative.  Objective:  Physical Exam BP 125/62 mmHg  Pulse 88  Temp(Src) 98.5 F (36.9 C) (Oral)  Ht 5\' 2"  (1.575 m)  Wt 206 lb 3.2 oz (93.532 kg)  BMI 37.71 kg/m2  LMP 06/06/2015 (Exact Date) CONSTITUTIONAL: Well-developed, well-nourished female in no acute distress.  HENT:  Normocephalic, atraumatic. External right and left ear normal. Oropharynx is clear and moist EYES: Conjunctivae and EOM are normal. Pupils are equal, round, and reactive to light. No scleral icterus.  NECK: Normal range of motion, supple, no masses SKIN: Skin is warm and dry. No rash noted. Not diaphoretic. No erythema. No pallor. NEUROLOGIC: Alert and oriented to person, place, and time. Normal reflexes, muscle tone coordination. No cranial nerve deficit noted. PSYCHIATRIC: Normal mood and affect. Normal behavior. Normal judgment and thought content. CARDIOVASCULAR: Normal heart rate noted RESPIRATORY: Effort and breath sounds normal, no problems with respiration noted ABDOMEN: Soft, no distention noted.   PELVIC: Normal appearing external genitalia; normal appearing vaginal mucosa and cervix. Malodorous, thin, yellow discharge noted.  Normal uterine size, no other palpable masses, no uterine or adnexal  tenderness. MUSCULOSKELETAL: Normal range of motion. No edema noted.  Labs and Imaging Results for orders placed or performed in visit on 07/15/15 (from the past 24 hour(s))  POCT urinalysis dip (device)     Status: Abnormal   Collection Time: 07/15/15  9:49 AM  Result Value Ref Range   Glucose, UA NEGATIVE NEGATIVE mg/dL   Bilirubin Urine SMALL (A) NEGATIVE   Ketones, ur 40 (A) NEGATIVE mg/dL   Specific Gravity, Urine >=1.030 1.005 - 1.030   Hgb urine dipstick SMALL (A) NEGATIVE   pH 5.5 5.0 - 8.0   Protein, ur NEGATIVE NEGATIVE mg/dL   Urobilinogen, UA 0.2 0.0 - 1.0 mg/dL   Nitrite NEGATIVE NEGATIVE   Leukocytes, UA TRACE (A) NEGATIVE  Pregnancy, urine POC     Status: Abnormal   Collection Time: 07/15/15 11:00 AM  Result Value Ref Range   Preg Test, Ur POSITIVE (A) NEGATIVE     Assessment & Plan:  1. Vaginal discharge during pregnancy in first trimester 2. BV (bacterial vaginosis) Likely BV. Will treat presumptively. Will follow up results and manage accordingly. - GC/Chlamydia probe amp ([place] - Wet prep, genital - metroNIDAZOLE (FLAGYL) 500 MG tablet; Take 1 tablet (500 mg total) by mouth 2 (two) times daily.  Dispense: 14 tablet; Refill: 0 Patient was told to initiate prenatal care, take prenatal vitamins, avoid alcohol and teratogens. Pregnancy confirmation letter given. Routine preventative health maintenance measures emphasized. Please refer to After Visit Summary for other counseling recommendations.   [facility] or other [facility] provider.   Total face-to-face time with patient: 20 minutes. Over 50% of encounter was spent on counseling and coordination of care.   Tricha Ruggirello  Macon Large, MD, FACOG Attending Obstetrician & Gynecologist, [facility] and [facility]

## 2015-07-16 LAB — GC/CHLAMYDIA PROBE AMP (~~LOC~~) NOT AT ARMC
CHLAMYDIA, DNA PROBE: NEGATIVE
NEISSERIA GONORRHEA: NEGATIVE

## 2015-07-16 LAB — WET PREP, GENITAL
TRICH WET PREP: NONE SEEN
YEAST WET PREP: NONE SEEN

## 2015-07-24 ENCOUNTER — Encounter (HOSPITAL_COMMUNITY): Payer: Self-pay | Admitting: Emergency Medicine

## 2015-07-24 ENCOUNTER — Emergency Department (HOSPITAL_COMMUNITY)
Admission: EM | Admit: 2015-07-24 | Discharge: 2015-07-24 | Disposition: A | Discharge status: Home / Self Care | Payer: Medicaid Other | Attending: Emergency Medicine | Admitting: Emergency Medicine

## 2015-07-24 DIAGNOSIS — Z3491 Encounter for supervision of normal pregnancy, unspecified, first trimester: Secondary | ICD-10-CM

## 2015-07-24 DIAGNOSIS — O2341 Unspecified infection of urinary tract in pregnancy, first trimester: Secondary | ICD-10-CM | POA: Diagnosis not present

## 2015-07-24 DIAGNOSIS — N390 Urinary tract infection, site not specified: Secondary | ICD-10-CM

## 2015-07-24 DIAGNOSIS — E669 Obesity, unspecified: Secondary | ICD-10-CM | POA: Diagnosis not present

## 2015-07-24 DIAGNOSIS — R319 Hematuria, unspecified: Secondary | ICD-10-CM

## 2015-07-24 DIAGNOSIS — O26851 Spotting complicating pregnancy, first trimester: Secondary | ICD-10-CM | POA: Diagnosis not present

## 2015-07-24 DIAGNOSIS — O99211 Obesity complicating pregnancy, first trimester: Secondary | ICD-10-CM | POA: Insufficient documentation

## 2015-07-24 DIAGNOSIS — Z8661 Personal history of infections of the central nervous system: Secondary | ICD-10-CM | POA: Insufficient documentation

## 2015-07-24 DIAGNOSIS — Z87891 Personal history of nicotine dependence: Secondary | ICD-10-CM | POA: Diagnosis not present

## 2015-07-24 DIAGNOSIS — Z3A01 Less than 8 weeks gestation of pregnancy: Secondary | ICD-10-CM | POA: Diagnosis not present

## 2015-07-24 DIAGNOSIS — R112 Nausea with vomiting, unspecified: Secondary | ICD-10-CM

## 2015-07-24 DIAGNOSIS — O210 Mild hyperemesis gravidarum: Secondary | ICD-10-CM | POA: Diagnosis not present

## 2015-07-24 LAB — COMPREHENSIVE METABOLIC PANEL
ALT: 15 U/L (ref 14–54)
ANION GAP: 10 (ref 5–15)
AST: 18 U/L (ref 15–41)
Albumin: 3.1 g/dL — ABNORMAL LOW (ref 3.5–5.0)
Alkaline Phosphatase: 32 U/L — ABNORMAL LOW (ref 38–126)
BUN: <5 mg/dL — ABNORMAL LOW (ref 6–20)
CALCIUM: 8.8 mg/dL — AB (ref 8.9–10.3)
CHLORIDE: 107 mmol/L (ref 101–111)
CO2: 20 mmol/L — AB (ref 22–32)
Creatinine, Ser: 0.54 mg/dL (ref 0.44–1.00)
GFR calc non Af Amer: >60 mL/min (ref >60)
Glucose, Bld: 107 mg/dL — ABNORMAL HIGH (ref 65–99)
Potassium: 3.2 mmol/L — ABNORMAL LOW (ref 3.5–5.1)
SODIUM: 137 mmol/L (ref 135–145)
Total Bilirubin: 0.4 mg/dL (ref 0.3–1.2)
Total Protein: 6.4 g/dL — ABNORMAL LOW (ref 6.5–8.1)

## 2015-07-24 LAB — CBC
HCT: 35.1 % — ABNORMAL LOW (ref 36.0–46.0)
HEMOGLOBIN: 11.3 g/dL — AB (ref 12.0–15.0)
MCH: 25.6 pg — AB (ref 26.0–34.0)
MCHC: 32.2 g/dL (ref 30.0–36.0)
MCV: 79.6 fL (ref 78.0–100.0)
Platelets: 304 10*3/uL (ref 150–400)
RBC: 4.41 MIL/uL (ref 3.87–5.11)
RDW: 16 % — ABNORMAL HIGH (ref 11.5–15.5)
WBC: 6.2 10*3/uL (ref 4.0–10.5)

## 2015-07-24 LAB — URINE MICROSCOPIC-ADD ON

## 2015-07-24 LAB — I-STAT BETA HCG BLOOD, ED (MC, WL, AP ONLY): I-stat hCG, quantitative: >2000 m[IU]/mL — ABNORMAL HIGH (ref <5)

## 2015-07-24 LAB — LIPASE, BLOOD: LIPASE: 19 U/L (ref 11–51)

## 2015-07-24 LAB — URINALYSIS, ROUTINE W REFLEX MICROSCOPIC
Glucose, UA: NEGATIVE mg/dL
Ketones, ur: 15 mg/dL — AB
Nitrite: NEGATIVE
Protein, ur: 30 mg/dL — AB
SPECIFIC GRAVITY, URINE: 1.027 (ref 1.005–1.030)
pH: 6 (ref 5.0–8.0)

## 2015-07-24 MED ORDER — ONDANSETRON HCL 4 MG/2ML IJ SOLN
4.0000 mg | Freq: Once | INTRAMUSCULAR | Status: AC
  Administered 2015-07-24: 4 mg via INTRAVENOUS
  Filled 2015-07-24: qty 2

## 2015-07-24 MED ORDER — CEPHALEXIN 500 MG PO CAPS: 500.0000 mg | ORAL_CAPSULE | Freq: Three times a day (TID) | ORAL | Status: DC

## 2015-07-24 MED ORDER — SODIUM CHLORIDE 0.9 % IV BOLUS (SEPSIS)
1000.0000 mL | Freq: Once | INTRAVENOUS | Status: AC
  Administered 2015-07-24: 1000 mL via INTRAVENOUS

## 2015-07-24 MED ORDER — ONDANSETRON 4 MG PO TBDP: 4.0000 mg | ORAL_TABLET | Freq: Once | ORAL | Status: DC | PRN

## 2015-07-24 MED ORDER — ONDANSETRON 4 MG PO TBDP: 4.0000 mg | ORAL_TABLET | Freq: Three times a day (TID) | ORAL | Status: DC | PRN

## 2015-07-24 NOTE — ED Provider Notes (Signed)
CSN: [id number]     Arrival date & time 07/24/15  1031 History   First MD Initiated Contact with Patient 07/24/15 1035     Chief Complaint  Patient presents with  . Emesis During Pregnancy     (Consider location/radiation/quality/duration/timing/severity/associated sxs/prior Treatment) Patient is a 35 y.o. female presenting with vomiting. The history is provided by the patient.  Emesis Severity:  Moderate Duration:  1 week Timing:  Constant Quality:  Stomach contents Progression:  Unchanged Chronicity:  New Recent urination:  Normal Relieved by:  Nothing Worsened by:  Nothing tried Ineffective treatments:  None tried Associated symptoms: no abdominal pain, no diarrhea and no fever   Associated symptoms comment:  Small amount of spotting when first wiping after urination Risk factors: pregnant now     Past Medical History  Diagnosis Date  . Meningitis   . Obesity (BMI 35.0-39.9 without comorbidity) (HCC)    History reviewed. No pertinent past surgical history. Family History  Problem Relation Age of Onset  . Diabetes Mother    Social History  Substance Use Topics  . Smoking status: Former Smoker    Types: Cigarettes    Quit date: 06/23/2015  . Smokeless tobacco: Never Used  . Alcohol Use: Yes     Comment: socially   OB History    Gravida Para Term Preterm AB TAB SAB Ectopic Multiple Living   Review of Systems  Gastrointestinal: Positive for vomiting. Negative for abdominal pain and diarrhea.  All other systems reviewed and are negative.     Allergies  Review of patient's allergies indicates no known allergies.  Home Medications   Prior to Admission medications   Medication Sig Start Date End Date Taking? Authorizing Provider  ibuprofen (ADVIL,MOTRIN) 800 MG tablet Take 1 tablet (800 mg total) by mouth every 8 (eight) hours as needed. Patient not taking: Reported on 07/15/2015 06/02/15   Charlestine Night, PA-C   BP 111/56 mmHg  Pulse  81  Resp 16  Ht  (1.575 m)  Wt 202 lb (91.627 kg)  BMI 36.94 kg/m2  SpO2 100%  LMP 06/06/2015 (Exact Date) Physical Exam  Constitutional: She is oriented to person, place, and time. She appears well-developed and well-nourished. No distress.  HENT:  Head: Normocephalic.  Eyes: Conjunctivae are normal.  Neck: Neck supple. No tracheal deviation present.  Cardiovascular: Normal rate and regular rhythm.   Pulmonary/Chest: Effort normal. No respiratory distress.  Abdominal: Soft. She exhibits no distension. There is no tenderness. There is no rebound and no guarding.  Neurological: She is alert and oriented to person, place, and time.  Skin: Skin is warm and dry.  Psychiatric: She has a normal mood and affect.  Vitals reviewed.   ED Course  Procedures (including critical care time)  Emergency Focused Ultrasound Exam Limited ultrasound of the pregnant uterus in the first trimester  Performed and interpreted by Dr. Clydene Pugh Indication: determination of pregnancy location  Multiple views of the uterus are obtained with a multi-frequency probe by transabdominal approach.  Findings: + GS, + fetal pole, no cardiac flicker noted Interpretation: confirmed IUP Images archived electronically.  CPT Code 11914  Labs Review Labs Reviewed  COMPREHENSIVE METABOLIC PANEL - Abnormal; Notable for the following:    Potassium 3.2 (*)    CO2 20 (*)    Glucose, Bld 107 (*)    BUN <5 (*)    Calcium 8.8 (*)    Total  Protein 6.4 (*)    Albumin 3.1 (*)    Alkaline Phosphatase 32 (*)    All other components within normal limits  CBC - Abnormal; Notable for the following:    Hemoglobin 11.3 (*)    HCT 35.1 (*)    MCH 25.6 (*)    RDW 16.0 (*)    All other components within normal limits  URINALYSIS, ROUTINE W REFLEX MICROSCOPIC (NOT AT [facility]) - Abnormal; Notable for the following:    Color, Urine AMBER (*)    APPearance TURBID (*)    Hgb urine dipstick LARGE (*)    Bilirubin Urine SMALL (*)     Ketones, ur 15 (*)    Protein, ur 30 (*)    Leukocytes, UA MODERATE (*)    All other components within normal limits  URINE MICROSCOPIC-ADD ON - Abnormal; Notable for the following:    Squamous Epithelial / LPF 6-30 (*)    Bacteria, UA FEW (*)    All other components within normal limits  I-STAT BETA HCG BLOOD, ED ([facility], [facility], [facility] ONLY) - Abnormal; Notable for the following:    I-stat hCG, quantitative >2000.0 (*)    All other components within normal limits  URINE CULTURE  LIPASE, BLOOD    Imaging Review No results found. I have personally reviewed and evaluated these images and lab results as part of my medical decision-making.   EKG Interpretation None      MDM   Final diagnoses:  Non-intractable vomiting with nausea, vomiting of unspecified type  Normal IUP (intrauterine pregnancy) on prenatal ultrasound, first trimester  Spotting affecting pregnancy in first trimester  Urinary tract infection with hematuria, site unspecified   LMP 06/06/15  34 y.o. [id number] presents with increased urinary frequency, nausea, and emesis over the last week. IUP noted on bedside US. Symptoms and UA are concerning for UTI and associated hematuria. Discussed possibility of threatened Ab and Pt understood possible risks to pregnancy. Will treat with keflex pending culture. Feeling better after zofran and fluids. Plan to follow up with PCP as needed and return precautions discussed for worsening or new concerning symptoms.     Lyndal Pulleyaniel Carrah Eppolito, MD 07/24/15 [phone]

## 2015-07-24 NOTE — ED Notes (Signed)
Pt states she is [redacted] weeks pregnant and for 1 week now has been having constant nausea and some vomiting. Pt denies any pain. Pt reports spotting blood when wiping.

## 2015-07-24 NOTE — Discharge Instructions (Signed)
Pregnancy and Urinary Tract Infection  A urinary tract infection (UTI) is a bacterial infection of the urinary tract. Infection of the urinary tract can include the ureters, kidneys (pyelonephritis), bladder (cystitis), and urethra (urethritis). All pregnant women should be screened for bacteria in the urinary tract. Identifying and treating a UTI will decrease the risk of preterm labor and developing more serious infections in both the mother and baby.  CAUSES  Bacteria germs cause almost all UTIs.   RISK FACTORS  Many factors can increase your chances of getting a UTI during pregnancy. These include:  · Having a short urethra.  · Poor toilet and hygiene habits.  · Sexual intercourse.  · Blockage of urine along the urinary tract.  · Problems with the pelvic muscles or nerves.  · Diabetes.  · Obesity.  · Bladder problems after having several children.  · Previous history of UTI.  SIGNS AND SYMPTOMS   · Pain, burning, or a stinging feeling when urinating.  · Suddenly feeling the need to urinate right away (urgency).  · Loss of bladder control (urinary incontinence).  · Frequent urination, more than is common with pregnancy.  · Lower abdominal or back discomfort.  · Cloudy urine.  · Blood in the urine (hematuria).  · Fever.   When the kidneys are infected, the symptoms may be:  · Back pain.  · Flank pain on the right side more so than the left.  · Fever.  · Chills.  · Nausea.  · Vomiting.  DIAGNOSIS   A urinary tract infection is usually diagnosed through urine tests. Additional tests and procedures are sometimes done. These may include:  · Ultrasound exam of the kidneys, ureters, bladder, and urethra.  · Looking in the bladder with a lighted tube (cystoscopy).  TREATMENT  Typically, UTIs can be treated with antibiotic medicines.   HOME CARE INSTRUCTIONS   · Only take over-the-counter or prescription medicines as directed by your health care provider. If you were prescribed antibiotics, take them as directed. Finish  them even if you start to feel better.  · Drink enough fluids to keep your urine clear or pale yellow.  · Do not have sexual intercourse until the infection is gone and your health care provider says it is okay.  · Make sure you are tested for UTIs throughout your pregnancy. These infections often come back.   Preventing a UTI in the Future  · Practice good toilet habits. Always wipe from front to back. Use the tissue only once.  · Do not hold your urine. Empty your bladder as soon as possible when the urge comes.  · Do not douche or use deodorant sprays.  · Wash with soap and warm water around the genital area and the anus.  · Empty your bladder before and after sexual intercourse.  · Wear underwear with a cotton crotch.  · Avoid caffeine and carbonated drinks. They can irritate the bladder.  · Drink cranberry juice or take cranberry pills. This may decrease the risk of getting a UTI.  · Do not drink alcohol.  · Keep all your appointments and tests as scheduled.   SEEK MEDICAL CARE IF:   · Your symptoms get worse.  · You are still having fevers 2 or more days after treatment begins.  · You have a rash.  · You feel that you are having problems with medicines prescribed.  · You have abnormal vaginal discharge.  SEEK IMMEDIATE MEDICAL CARE IF:   · You have back or flank   pain.  · You have chills.  · You have blood in your urine.  · You have nausea and vomiting.  · You have contractions of your uterus.  · You have a gush of fluid from the vagina.  MAKE SURE YOU:  · Understand these instructions.    · Will watch your condition.    · Will get help right away if you are not doing well or get worse.       This information is not intended to replace advice given to you by your health care provider. Make sure you discuss any questions you have with your health care provider.     Document Released: 07/29/2010 Document Revised: 01/22/2013 Document Reviewed: 10/31/2012  Elsevier Interactive Patient Education ©2016 [employer]  Inc[employer]

## 2015-07-26 LAB — URINE CULTURE

## 2015-08-02 ENCOUNTER — Telehealth: Payer: Self-pay | Admitting: General Practice

## 2015-08-02 NOTE — Telephone Encounter (Signed)
Patient called and left message stating she is about [redacted] weeks pregnant and has been constipated for 2 weeks now. Patient states her belly hurts off and on because of this and she sometimes feels nauseous. Called patient and discussed dietary sources of fibers and increasing fluid intake. Also recommended added daily fiber like metamucil or fibercon and that she may also take colace twice a day. Also recommended miralax if that doesn't help. Patient verbalized understanding to all & had no questions

## 2015-09-15 ENCOUNTER — Ambulatory Visit (INDEPENDENT_AMBULATORY_CARE_PROVIDER_SITE_OTHER): Payer: Medicaid Other | Admitting: Family

## 2015-09-15 ENCOUNTER — Other Ambulatory Visit (HOSPITAL_COMMUNITY)
Admission: RE | Admit: 2015-09-15 | Discharge: 2015-09-15 | Disposition: A | Discharge status: Home / Self Care | Payer: Medicaid Other | Source: Ambulatory Visit | Attending: Family | Admitting: Family

## 2015-09-15 ENCOUNTER — Encounter: Payer: Self-pay | Admitting: Family

## 2015-09-15 VITALS — BP 123/63 | HR 95 | Wt 202.7 lb

## 2015-09-15 DIAGNOSIS — Z1151 Encounter for screening for human papillomavirus (HPV): Secondary | ICD-10-CM | POA: Diagnosis not present

## 2015-09-15 DIAGNOSIS — Z124 Encounter for screening for malignant neoplasm of cervix: Secondary | ICD-10-CM

## 2015-09-15 DIAGNOSIS — Z3482 Encounter for supervision of other normal pregnancy, second trimester: Secondary | ICD-10-CM

## 2015-09-15 DIAGNOSIS — Z01419 Encounter for gynecological examination (general) (routine) without abnormal findings: Secondary | ICD-10-CM | POA: Insufficient documentation

## 2015-09-15 DIAGNOSIS — O0990 Supervision of high risk pregnancy, unspecified, unspecified trimester: Secondary | ICD-10-CM | POA: Insufficient documentation

## 2015-09-15 DIAGNOSIS — Z3492 Encounter for supervision of normal pregnancy, unspecified, second trimester: Secondary | ICD-10-CM

## 2015-09-15 LAB — POCT URINALYSIS DIP (DEVICE)
GLUCOSE, UA: NEGATIVE mg/dL
NITRITE: NEGATIVE
Protein, ur: 100 mg/dL — AB
Specific Gravity, Urine: 1.025 (ref 1.005–1.030)
Urobilinogen, UA: 1 mg/dL (ref 0.0–1.0)
pH: 6.5 (ref 5.0–8.0)

## 2015-09-15 NOTE — Progress Notes (Signed)
   Subjective:    Sandra Mcknight is a [id number] 3035w3d being seen today for her first obstetrical visit.  Here with partner Lazelle.  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  Her obstetrical history is significant for two uncomplicated term normal vaginal deliveries. Patient does intend to breast feed. Pregnancy history fully reviewed.  Patient reports no complaints.  Filed Vitals:   09/15/15 0839  BP: 123/63  Pulse: 95  Weight: 202 lb 11.2 oz (91.944 kg)    HISTORY: OB History  Gravida Para Term Preterm AB SAB TAB Ectopic Multiple Living  3 2 2       4     # Outcome Date GA Lbr Len/2nd Weight Sex Delivery Anes PTL Lv  3 Current           2 Term 2007 8577w0d  7 lb (3.175 kg) M Vag-Spont  N Y  1 Term 2002 5477w0d  7 lb 2 oz (3.232 kg) F Vag-Spont  N Y     Past Medical History  Diagnosis Date  . Meningitis   . Obesity (BMI 35.0-39.9 without comorbidity) (HCC)    History reviewed. No pertinent past surgical history. Family History  Problem Relation Age of Onset  . Diabetes Mother      Exam   BP 123/63 mmHg  Pulse 95  Wt 202 lb 11.2 oz (91.944 kg)  LMP 06/06/2015 (Exact Date) Uterine Size: size equals dates  Pelvic Exam:    Perineum: No Hemorrhoids, Normal Perineum   Vulva: normal   Vagina:  normal mucosa, normal discharge, no palpable nodules   pH: Not done   Cervix: Slight bleeding following Pap, friable, no cervical motion tenderness and no lesions   Adnexa: normal adnexa and no mass, fullness, tenderness   Bony Pelvis: Adequate  System: Breast:  No nipple retraction or dimpling, No nipple discharge or bleeding, No axillary or supraclavicular adenopathy, Normal to palpation without dominant masses   Skin: normal coloration and turgor, no rashes    Neurologic: negative   Extremities: normal strength, tone, and muscle mass   HEENT neck supple with midline trachea and thyroid without masses   Mouth/Teeth mucous membranes moist, pharynx  normal without lesions   Neck supple and no masses   Cardiovascular: regular rate and rhythm, no murmurs or gallops   Respiratory:  appears well, vitals normal, no respiratory distress, acyanotic, normal RR, neck free of mass or lymphadenopathy, chest clear, no wheezing, crepitations, rhonchi, normal symmetric air entry   Abdomen: soft, non-tender; bowel sounds normal; no masses,  no organomegaly   Urinary: urethral meatus normal      Assessment:    Pregnancy: [id number] Patient Active Problem List   Diagnosis Date Noted  . Supervision of normal pregnancy, antepartum 09/15/2015  . Obesity 03/31/2011        Plan:     Initial labs drawn. Pap smear collected. Early 1 hr obtained/ Prenatal vitamins. Problem list reviewed and updated. Genetic Screening discussed Quad Screen: ordered.  Ultrasound discussed; fetal survey: ordered.  Follow up in 4 weeks.   Marlis EdelsonKARIM, Gao Mitnick N 09/15/2015

## 2015-09-15 NOTE — Patient Instructions (Signed)

## 2015-09-15 NOTE — Progress Notes (Signed)
Early 1 hr due to family history and bmi.  Anatomy Scan Scheduled for 10/15/15 at 0815.

## 2015-09-16 LAB — PRENATAL PROFILE (SOLSTAS)
ANTIBODY SCREEN: NEGATIVE
Basophils Absolute: 0 cells/uL (ref 0–200)
Basophils Relative: 0 %
EOS PCT: 3 %
Eosinophils Absolute: 255 cells/uL (ref 15–500)
HCT: 31.9 % — ABNORMAL LOW (ref 35.0–45.0)
HEMOGLOBIN: 10.4 g/dL — AB (ref 11.7–15.5)
HIV 1&2 Ab, 4th Generation: NONREACTIVE
Hepatitis B Surface Ag: NEGATIVE
LYMPHS PCT: 17 %
Lymphs Abs: 1445 cells/uL (ref 850–3900)
MCH: 26.3 pg — ABNORMAL LOW (ref 27.0–33.0)
MCHC: 32.6 g/dL (ref 32.0–36.0)
MCV: 80.8 fL (ref 80.0–100.0)
MONOS PCT: 5 %
MPV: 9.7 fL (ref 7.5–12.5)
Monocytes Absolute: 425 cells/uL (ref 200–950)
NEUTROS ABS: 6375 {cells}/uL (ref 1500–7800)
NEUTROS PCT: 75 %
PLATELETS: 344 10*3/uL (ref 140–400)
RBC: 3.95 MIL/uL (ref 3.80–5.10)
RDW: 17.3 % — ABNORMAL HIGH (ref 11.0–15.0)
RH TYPE: POSITIVE
RUBELLA: 0.97 {index} — AB (ref <0.90)
WBC: 8.5 10*3/uL (ref 3.8–10.8)

## 2015-09-16 LAB — CYTOLOGY - PAP

## 2015-09-16 LAB — GLUCOSE TOLERANCE, 1 HOUR (50G) W/O FASTING: GLUCOSE, 1 HR, GESTATIONAL: 163 mg/dL — AB (ref <140)

## 2015-09-19 ENCOUNTER — Encounter: Payer: Self-pay | Admitting: Family

## 2015-09-19 DIAGNOSIS — Z283 Underimmunization status: Secondary | ICD-10-CM | POA: Insufficient documentation

## 2015-09-19 DIAGNOSIS — O09899 Supervision of other high risk pregnancies, unspecified trimester: Secondary | ICD-10-CM | POA: Insufficient documentation

## 2015-09-19 DIAGNOSIS — O9989 Other specified diseases and conditions complicating pregnancy, childbirth and the puerperium: Secondary | ICD-10-CM

## 2015-09-20 ENCOUNTER — Telehealth: Payer: Self-pay

## 2015-09-20 NOTE — Telephone Encounter (Signed)
Per ZOXWRUEWalidah, pt has elevated 1 hr glucola needs a 3 hr.  Called pt and informed pt results and the need for 3 hr glucola.  Pt stated that she will be able to come in on Wednesday fasting and will stay in the office for duration of test.  Note sent to front office to schedule appt.

## 2015-09-22 ENCOUNTER — Other Ambulatory Visit: Payer: Medicaid Other

## 2015-09-22 DIAGNOSIS — Z3490 Encounter for supervision of normal pregnancy, unspecified, unspecified trimester: Secondary | ICD-10-CM

## 2015-09-23 ENCOUNTER — Encounter: Payer: Self-pay | Admitting: Family

## 2015-09-23 ENCOUNTER — Telehealth: Payer: Self-pay | Admitting: General Practice

## 2015-09-23 DIAGNOSIS — O24419 Gestational diabetes mellitus in pregnancy, unspecified control: Secondary | ICD-10-CM | POA: Insufficient documentation

## 2015-09-23 LAB — GLUCOSE TOLERANCE, 3 HOURS
GLUCOSE 3 HOUR GTT: 178 mg/dL — AB (ref <145)
GLUCOSE, 2 HOUR-GESTATIONAL: 181 mg/dL — AB (ref <165)
Glucose Tolerance, 1 hour: 202 mg/dL — ABNORMAL HIGH (ref <190)
Glucose Tolerance, Fasting: 83 mg/dL (ref 65–104)

## 2015-09-23 NOTE — Telephone Encounter (Signed)
Called patient and informed her of 3 hr results and need to come for diabetes ed on Monday 6/12. Patient verbalized understanding & states she can come at [id number]. Told patient someone will contact her to move up her doctors visit for around 6/19. Patient verbalized understanding & had no questions

## 2015-09-27 ENCOUNTER — Ambulatory Visit: Payer: Medicaid Other

## 2015-10-04 ENCOUNTER — Encounter: Payer: Self-pay | Admitting: Obstetrics & Gynecology

## 2015-10-04 ENCOUNTER — Ambulatory Visit (INDEPENDENT_AMBULATORY_CARE_PROVIDER_SITE_OTHER): Payer: Medicaid Other | Admitting: Obstetrics & Gynecology

## 2015-10-04 VITALS — BP 120/66 | HR 104 | Wt 199.9 lb

## 2015-10-04 DIAGNOSIS — G43909 Migraine, unspecified, not intractable, without status migrainosus: Secondary | ICD-10-CM | POA: Insufficient documentation

## 2015-10-04 DIAGNOSIS — O24419 Gestational diabetes mellitus in pregnancy, unspecified control: Secondary | ICD-10-CM | POA: Diagnosis present

## 2015-10-04 DIAGNOSIS — G43709 Chronic migraine without aura, not intractable, without status migrainosus: Secondary | ICD-10-CM | POA: Diagnosis not present

## 2015-10-04 DIAGNOSIS — Z3482 Encounter for supervision of other normal pregnancy, second trimester: Secondary | ICD-10-CM

## 2015-10-04 LAB — HEMOGLOBIN A1C
HEMOGLOBIN A1C: 6 % — AB (ref <5.7)
MEAN PLASMA GLUCOSE: 126 mg/dL

## 2015-10-04 LAB — POCT URINALYSIS DIP (DEVICE)
Glucose, UA: NEGATIVE mg/dL
Ketones, ur: 40 mg/dL — AB
NITRITE: NEGATIVE
PH: 6 (ref 5.0–8.0)
Protein, ur: 100 mg/dL — AB
Specific Gravity, Urine: 1.025 (ref 1.005–1.030)
UROBILINOGEN UA: 1 mg/dL (ref 0.0–1.0)

## 2015-10-04 LAB — TSH: TSH: 2.5 mIU/L

## 2015-10-04 MED ORDER — BUTALBITAL-APAP-CAFF-COD 50-325-40-30 MG PO CAPS: 1.0000 | ORAL_CAPSULE | ORAL | Status: DC | PRN

## 2015-10-04 NOTE — Progress Notes (Signed)
Subjective:  Sandra Mcknight is a 35 y.o. G3P2002 at 7554w1d being seen today for ongoing prenatal care.  She is currently monitored for the following issues for this high-risk pregnancy and has Obesity; Supervision of normal pregnancy, antepartum; Rubella non-immune status, antepartum; Gestational diabetes mellitus (GDM) affecting pregnancy, antepartum (HCC); and Migraine on her problem list.  Patient reports chronic migraine not responding to Tylenol.  Contractions: Not present.  .  Movement: Present. Denies leaking of fluid.   The following portions of the patient's history were reviewed and updated as appropriate: allergies, current medications, past family history, past medical history, past social history, past surgical history and problem list. Problem list updated.  Objective:   Filed Vitals:   10/04/15 0941  BP: 120/66  Pulse: 104  Weight: 199 lb 14.4 oz (90.674 kg)    Fetal Status: Fetal Heart Rate (bpm): 156   Movement: Present     General:  Alert, oriented and cooperative. Patient is in no acute distress.  Skin: Skin is warm and dry. No rash noted.   Cardiovascular: Normal heart rate noted  Respiratory: Normal respiratory effort, no problems with respiration noted  Abdomen: Soft, gravid, appropriate for gestational age. Pain/Pressure: Absent     Pelvic: Cervical exam deferred        Extremities: Normal range of motion.     Mental Status: Normal mood and affect. Normal behavior. Normal judgment and thought content.   Urinalysis:    pt did not leave urine sample, RN to chart she she does.  Assessment and Plan:  Pregnancy: G3P2002 at 3554w1d  1. Gestational diabetes mellitus in second trimester, unspecified diabetic control--NEW DIAGNOSIS -Needs to  - HgB A1c - TSH -anatomy and dating US scheduled -Quad screen today. -Meet with diabetic educator.  2. Chronic migraine without aura without status migrainosus, not intractable--WORSENING -stop Tylenol -Fiorcett -Referral  to headache specialist  3.  Proteinuria on dip (not clean catch) Will get protein creat ration today; if elevated, will get 24 hour urine.   Preterm labor symptoms and general obstetric precautions including but not limited to vaginal bleeding, contractions, leaking of fluid and fetal movement were reviewed in detail with the patient. Please refer to After Visit Summary for other counseling recommendations.  Return in about 1 week (around 10/11/2015).   Lesly DukesKelly H Virtie Bungert, MD

## 2015-10-05 ENCOUNTER — Telehealth: Payer: Self-pay | Admitting: *Deleted

## 2015-10-05 LAB — PROTEIN / CREATININE RATIO, URINE
CREATININE, URINE: 258 mg/dL (ref 20–320)
Protein Creatinine Ratio: 140 mg/g creat (ref 21–161)
TOTAL PROTEIN, URINE: 36 mg/dL — AB (ref 5–24)

## 2015-10-05 NOTE — Telephone Encounter (Signed)
Keyry called and states she was unable to get fioricet because problems with insurance,said they sent a form. I am unable to see form . Informed Philbert RiserLolita I will call [facility].  I called [facility] and they stated they will fax us a form for prior authorization - if not approved will need to change meds.

## 2015-10-06 ENCOUNTER — Encounter (HOSPITAL_COMMUNITY): Payer: Self-pay | Admitting: Obstetrics & Gynecology

## 2015-10-07 ENCOUNTER — Other Ambulatory Visit: Payer: Self-pay | Admitting: General Practice

## 2015-10-07 NOTE — Telephone Encounter (Signed)
Called pharmacy & ordered correct medication

## 2015-10-08 LAB — AFP, QUAD SCREEN
AFP: 39.7 ng/mL
Age Alone: 1:331 {titer}
CURR GEST AGE: 17.1 wk
Down Syndrome Scr Risk Est: 1:512 {titer}
HCG, Total: 70.64 IU/mL
INH: 183.6 pg/mL
INTERPRETATION-AFP: NEGATIVE
MOM FOR HCG: 2.48
MoM for AFP: 1.07
MoM for INH: 1.23
Open Spina bifida: NEGATIVE
Osb Risk: 1:20900 {titer}
TRI 18 SCR RISK EST: NEGATIVE
uE3 Mom: 0.88
uE3 Value: 0.89 ng/mL

## 2015-10-11 ENCOUNTER — Ambulatory Visit: Payer: Medicaid Other | Admitting: Student

## 2015-10-11 ENCOUNTER — Encounter: Payer: Medicaid Other | Admitting: Obstetrics and Gynecology

## 2015-10-11 ENCOUNTER — Encounter: Payer: Medicaid Other | Attending: Obstetrics & Gynecology | Admitting: Dietician

## 2015-10-11 VITALS — Ht 62.5 in | Wt 198.0 lb

## 2015-10-11 DIAGNOSIS — O24919 Unspecified diabetes mellitus in pregnancy, unspecified trimester: Secondary | ICD-10-CM | POA: Insufficient documentation

## 2015-10-11 DIAGNOSIS — O24419 Gestational diabetes mellitus in pregnancy, unspecified control: Secondary | ICD-10-CM

## 2015-10-11 NOTE — Progress Notes (Signed)
Nutrition note: GDM diet education Pt has lost 20# @ 2759w1d- pt reports she had a lot of N&V but it has decreased lately. Pt reports eating 3 meals & 3 snacks/d (pt works 11pm-9am). Pt reports taking a PNV or 2 multivitamins/d. Pt reports having some heartburn. Pt reports walking for 20 mins most days of the week. Pt received verbal & written education on GDM diet. Encouraged 30 mins of walking/d. Discussed wt gain goals of 11-20# or 0.5#/wk. Pt agrees to follow GDM diet with 3 meals & 1-3 snacks/d with proper CHO/ protein combination. Pt has WIC & plans to BF. F/u as needed by CDE. Blondell RevealLaura Yunus Stoklosa, MS, RD, LDN, [facility] HospitalBCLC

## 2015-10-11 NOTE — Progress Notes (Signed)
Diabetes Education: 10/11/15 Sandra Mcknight is a G3P2 lady with no previous hx of GDM. EDC is 03/12/16. Provided [place] handout;" Nutrition, Diabetes & Pregnancy". Completed review of GDM and its implications for the health of the Mom and Baby. Completed review of self-cared measures and weight loss after delivery to prevent possible onset of Type 2 DM. Encouraged her to walk daily for 30 minutes in the cooler part of the day.  If too hot and humid, try walking in place in her home or going to big box store to walk. Client in on [place] [employer].  Nursing to send electronic order for Accu-Chek Aviva Plus Meter and a box of strips and a box of the Accu-Check Soft Clix lancets. Instructed on blood glucose self-monitoring.  Used  a demonstration meter.  Using a disposable lancet and my own True Track meter, her glucose level fasting was 83 mg/dl ant 29:5211:20 am. Instructed to monitor fasting and 2 hr post-meals blood glucose levels. Instructed to record blood glucose levels and to bring meter and glucose log to all clinic appointments. Sandra Kingdavid Leinbach, RN, RD, LDN

## 2015-10-12 ENCOUNTER — Other Ambulatory Visit: Payer: Self-pay

## 2015-10-12 ENCOUNTER — Encounter: Payer: Medicaid Other | Admitting: Student

## 2015-10-12 MED ORDER — ACCU-CHEK SOFT TOUCH LANCETS MISC: Status: DC

## 2015-10-12 MED ORDER — ACCU-CHEK AVIVA PLUS W/DEVICE KIT: 1.0000 | PACK | Freq: Once | Status: DC

## 2015-10-12 MED ORDER — GLUCOSE BLOOD VI STRP: ORAL_STRIP | Status: DC

## 2015-10-12 NOTE — Progress Notes (Signed)
Visit opened so RN could order diabetic supplies.

## 2015-10-15 ENCOUNTER — Other Ambulatory Visit: Payer: Self-pay | Admitting: Family

## 2015-10-15 ENCOUNTER — Ambulatory Visit (HOSPITAL_COMMUNITY)
Admission: RE | Admit: 2015-10-15 | Discharge: 2015-10-15 | Disposition: A | Discharge status: Home / Self Care | Payer: Medicaid Other | Source: Ambulatory Visit | Attending: Family | Admitting: Family

## 2015-10-15 DIAGNOSIS — Z3483 Encounter for supervision of other normal pregnancy, third trimester: Secondary | ICD-10-CM

## 2015-10-15 DIAGNOSIS — Z3A18 18 weeks gestation of pregnancy: Secondary | ICD-10-CM | POA: Diagnosis not present

## 2015-10-15 DIAGNOSIS — O24410 Gestational diabetes mellitus in pregnancy, diet controlled: Secondary | ICD-10-CM

## 2015-10-15 DIAGNOSIS — E669 Obesity, unspecified: Secondary | ICD-10-CM | POA: Diagnosis not present

## 2015-10-15 DIAGNOSIS — O99212 Obesity complicating pregnancy, second trimester: Secondary | ICD-10-CM | POA: Diagnosis not present

## 2015-10-15 DIAGNOSIS — Z3482 Encounter for supervision of other normal pregnancy, second trimester: Secondary | ICD-10-CM

## 2015-10-15 DIAGNOSIS — Z36 Encounter for antenatal screening of mother: Secondary | ICD-10-CM | POA: Diagnosis present

## 2015-10-15 DIAGNOSIS — Z363 Encounter for antenatal screening for malformations: Secondary | ICD-10-CM

## 2015-10-18 ENCOUNTER — Ambulatory Visit (INDEPENDENT_AMBULATORY_CARE_PROVIDER_SITE_OTHER): Payer: Medicaid Other | Admitting: Obstetrics and Gynecology

## 2015-10-18 VITALS — BP 113/62 | HR 106 | Wt 206.6 lb

## 2015-10-18 DIAGNOSIS — O24912 Unspecified diabetes mellitus in pregnancy, second trimester: Secondary | ICD-10-CM

## 2015-10-18 DIAGNOSIS — Z3480 Encounter for supervision of other normal pregnancy, unspecified trimester: Secondary | ICD-10-CM

## 2015-10-18 DIAGNOSIS — O24419 Gestational diabetes mellitus in pregnancy, unspecified control: Secondary | ICD-10-CM

## 2015-10-18 NOTE — Progress Notes (Signed)
Breastfeeding tip reviewed 

## 2015-10-18 NOTE — Progress Notes (Signed)
Subjective:  Sandra Mcknight is a 35 y.o. G3P2002 at 4017w1d being seen today for ongoing prenatal care.  She is currently monitored for the following issues for this high-risk pregnancy and has Obesity; Supervision of normal pregnancy, antepartum; Rubella non-immune status, antepartum; Gestational diabetes mellitus (GDM) affecting pregnancy, antepartum (HCC); and Migraine on her problem list.  Patient reports no complaints.  Contractions: Not present. Vag. Bleeding: None.  Movement: Present. Denies leaking of fluid.   The following portions of the patient's history were reviewed and updated as appropriate: allergies, current medications, past family history, past medical history, past social history, past surgical history and problem list. Problem list updated.  Objective:   Filed Vitals:   10/18/15 1101  BP: 113/62  Pulse: 106  Weight: 206 lb 9.6 oz (93.713 kg)    Fetal Status: Fetal Heart Rate (bpm): 150   Movement: Present     General:  Alert, oriented and cooperative. Patient is in no acute distress.  Skin: Skin is warm and dry. No rash noted.   Cardiovascular: Normal heart rate noted  Respiratory: Normal respiratory effort, no problems with respiration noted  Abdomen: Soft, gravid, appropriate for gestational age. Pain/Pressure: Present     Pelvic: Cervical exam deferred        Extremities: Normal range of motion.  Edema: None  Mental Status: Normal mood and affect. Normal behavior. Normal judgment and thought content.   Urinalysis:      Assessment and Plan:  Pregnancy: G3P2002 at 217w1d  1. GDM - works 3rd shift and has not been checking gluocose as she should. Should check a fasting regardless of when it happens during the day, and then should check 2-hour PPs - f/u 2 wks DM educator, 4 wks HROB  Preterm labor symptoms and general obstetric precautions including but not limited to vaginal bleeding, contractions, leaking of fluid and fetal movement were reviewed in detail  with the patient. Please refer to After Visit Summary for other counseling recommendations.   Kathrynn RunningNoah Bedford Lucill Mauck, MD

## 2015-10-26 ENCOUNTER — Telehealth: Payer: Self-pay

## 2015-10-26 MED ORDER — GLUCOSE BLOOD VI STRP: ORAL_STRIP | Status: DC

## 2015-10-26 NOTE — Telephone Encounter (Signed)
Called pt and she stated that she was only given 50 strips because of the way that the Rx was originally written. I will re-send the prescription with the requested information. She may call back if she has additional problems or questions. Pt voiced understanding.

## 2015-10-26 NOTE — Addendum Note (Signed)
Addended by: Jill SideAY, Jonea Bukowski L on: 10/26/2015 03:23 PM   Modules accepted: Orders

## 2015-10-26 NOTE — Telephone Encounter (Signed)
Pt is requesting more testing strips be called in. She is testing four times a day and needs this to be stated to her pharmacy. Please Advise

## 2015-11-01 ENCOUNTER — Encounter: Payer: Medicaid Other | Attending: Obstetrics & Gynecology | Admitting: Dietician

## 2015-11-01 ENCOUNTER — Ambulatory Visit: Payer: Medicaid Other | Admitting: *Deleted

## 2015-11-01 DIAGNOSIS — O24919 Unspecified diabetes mellitus in pregnancy, unspecified trimester: Secondary | ICD-10-CM | POA: Insufficient documentation

## 2015-11-01 DIAGNOSIS — O24419 Gestational diabetes mellitus in pregnancy, unspecified control: Secondary | ICD-10-CM

## 2015-11-01 NOTE — Progress Notes (Signed)
Diabetes Education F/U 11/01/15 Comes today with her glucose records of the last 3 weeks. Works a schedule of 11 pm- 9 am each day.  Glucose fasting is elevated most of the time when she is working. Currently: Dinner is at 2:00 AM Fasting glucose check: 9:00 AM Goes to sleep 11:00-12:00 noon.  Sleeps until 2:00 PM Currently Lunch: 2-3:PM 4:00-5:00 pm takes son to ball practice. Naps 8:30-9:30 pm. Work at 11:00 pm Will snack during the evening and early morning hours and have dinner at 2:00 am. Fasting glucose levels ar [phone]. Recommended: Needs more day time sleep if possible. Change the dinner meal to 12:00 mid-night when working and have a snack at 2-3:00 am. Monitor the amount of fatty foods at meal time. When having a meal celebration at work, Occupational hygienistenlist the aid of her colleagues in helping her to monitor the carb intake and the serving sizes. Would like to f/u with the routine changes and effects on blood glucose. Maggie Galileo Colello, RN, RD, LDN

## 2015-11-03 ENCOUNTER — Other Ambulatory Visit: Payer: Self-pay

## 2015-11-03 MED ORDER — BUTALBITAL-APAP-CAFF-COD 50-325-40-30 MG PO CAPS: 1.0000 | ORAL_CAPSULE | ORAL | Status: DC | PRN

## 2015-11-04 ENCOUNTER — Telehealth: Payer: Self-pay | Admitting: General Practice

## 2015-11-04 DIAGNOSIS — G4489 Other headache syndrome: Secondary | ICD-10-CM

## 2015-11-04 MED ORDER — BUTALBITAL-APAP-CAFFEINE 50-325-40 MG PO TABS: 1.0000 | ORAL_TABLET | ORAL | Status: DC | PRN

## 2015-11-04 NOTE — Telephone Encounter (Signed)
Patient called and left message stating she has been trying to get a refill for a week now. Patient states pharmacy has received the refill but it has to prior authorized. Per chart review, fioricet was prescribed for patient- but contained codeine which isn't covered by insurance. Med switched to covered brand & phoned in to pharmacy. Called patient & informed her. Patient verbalized understanding & had no questions

## 2015-11-06 ENCOUNTER — Encounter (HOSPITAL_COMMUNITY): Payer: Self-pay | Admitting: Emergency Medicine

## 2015-11-06 ENCOUNTER — Emergency Department (HOSPITAL_COMMUNITY)
Admission: EM | Admit: 2015-11-06 | Discharge: 2015-11-07 | Disposition: A | Discharge status: Home / Self Care | Payer: Medicaid Other | Attending: Emergency Medicine | Admitting: Emergency Medicine

## 2015-11-06 DIAGNOSIS — O2342 Unspecified infection of urinary tract in pregnancy, second trimester: Secondary | ICD-10-CM

## 2015-11-06 DIAGNOSIS — Z3A22 22 weeks gestation of pregnancy: Secondary | ICD-10-CM | POA: Insufficient documentation

## 2015-11-06 DIAGNOSIS — O24419 Gestational diabetes mellitus in pregnancy, unspecified control: Secondary | ICD-10-CM

## 2015-11-06 DIAGNOSIS — Z87891 Personal history of nicotine dependence: Secondary | ICD-10-CM | POA: Diagnosis not present

## 2015-11-06 DIAGNOSIS — Z2839 Other underimmunization status: Secondary | ICD-10-CM

## 2015-11-06 DIAGNOSIS — Z283 Underimmunization status: Secondary | ICD-10-CM

## 2015-11-06 DIAGNOSIS — R102 Pelvic and perineal pain: Secondary | ICD-10-CM | POA: Diagnosis not present

## 2015-11-06 DIAGNOSIS — O9989 Other specified diseases and conditions complicating pregnancy, childbirth and the puerperium: Secondary | ICD-10-CM

## 2015-11-06 DIAGNOSIS — Z3480 Encounter for supervision of other normal pregnancy, unspecified trimester: Secondary | ICD-10-CM

## 2015-11-06 DIAGNOSIS — R1033 Periumbilical pain: Secondary | ICD-10-CM | POA: Diagnosis present

## 2015-11-06 LAB — URINALYSIS, ROUTINE W REFLEX MICROSCOPIC
BILIRUBIN URINE: NEGATIVE
Glucose, UA: NEGATIVE mg/dL
Hgb urine dipstick: NEGATIVE
Ketones, ur: NEGATIVE mg/dL
NITRITE: NEGATIVE
PROTEIN: NEGATIVE mg/dL
SPECIFIC GRAVITY, URINE: 1.03 (ref 1.005–1.030)
pH: 6 (ref 5.0–8.0)

## 2015-11-06 LAB — URINE MICROSCOPIC-ADD ON: RBC / HPF: NONE SEEN RBC/hpf (ref 0–5)

## 2015-11-06 NOTE — ED Notes (Signed)
Rapid OB called for patient  

## 2015-11-06 NOTE — ED Notes (Signed)
Nurse will attempt to draw labs with IV start. 

## 2015-11-06 NOTE — ED Notes (Signed)
RROB in to see pt who has c/o headache, abdominal pain, vaginal pressure when walking, and increased vaginal discharge. Pt reports no vaginal bleeding, positive fetal movement, and reports she is a gestational diabetic.

## 2015-11-06 NOTE — ED Notes (Signed)
Rapid OB at bedside 

## 2015-11-06 NOTE — ED Notes (Signed)
The first of efm recorded on paper; monitored for 10 more minutes and it was captured in obix

## 2015-11-06 NOTE — ED Notes (Signed)
Pt c/o HA x several days, pt is taking meds for same. Pt is 22 weeks preg, has been experiencing pressure in low abd and vagina x several day with heavy discharge. Today began having periumbilical pain, sharp intermittent onset 1900.

## 2015-11-07 LAB — COMPREHENSIVE METABOLIC PANEL
ALT: 20 U/L (ref 14–54)
ANION GAP: 9 (ref 5–15)
AST: 22 U/L (ref 15–41)
Albumin: 3 g/dL — ABNORMAL LOW (ref 3.5–5.0)
Alkaline Phosphatase: 51 U/L (ref 38–126)
BILIRUBIN TOTAL: 0.5 mg/dL (ref 0.3–1.2)
BUN: 6 mg/dL (ref 6–20)
CHLORIDE: 107 mmol/L (ref 101–111)
CO2: 21 mmol/L — ABNORMAL LOW (ref 22–32)
Calcium: 9 mg/dL (ref 8.9–10.3)
Creatinine, Ser: 0.47 mg/dL (ref 0.44–1.00)
Glucose, Bld: 98 mg/dL (ref 65–99)
POTASSIUM: 3.5 mmol/L (ref 3.5–5.1)
Sodium: 137 mmol/L (ref 135–145)
TOTAL PROTEIN: 7 g/dL (ref 6.5–8.1)

## 2015-11-07 LAB — CBC WITH DIFFERENTIAL/PLATELET
BASOS ABS: 0 10*3/uL (ref 0.0–0.1)
BASOS PCT: 0 %
EOS PCT: 2 %
Eosinophils Absolute: 0.2 10*3/uL (ref 0.0–0.7)
HCT: 42.8 % (ref 36.0–46.0)
Hemoglobin: 14.3 g/dL (ref 12.0–15.0)
LYMPHS PCT: 20 %
Lymphs Abs: 1.6 10*3/uL (ref 0.7–4.0)
MCH: 27.6 pg (ref 26.0–34.0)
MCHC: 33.4 g/dL (ref 30.0–36.0)
MCV: 82.5 fL (ref 78.0–100.0)
MONO ABS: 0.5 10*3/uL (ref 0.1–1.0)
Monocytes Relative: 7 %
NEUTROS ABS: 5.4 10*3/uL (ref 1.7–7.7)
Neutrophils Relative %: 71 %
Platelets: 236 10*3/uL (ref 150–400)
RBC: 5.19 MIL/uL — AB (ref 3.87–5.11)
RDW: 16 % — AB (ref 11.5–15.5)
WBC: 7.7 10*3/uL (ref 4.0–10.5)

## 2015-11-07 LAB — WET PREP, GENITAL
Clue Cells Wet Prep HPF POC: NONE SEEN
Sperm: NONE SEEN
TRICH WET PREP: NONE SEEN
YEAST WET PREP: NONE SEEN

## 2015-11-07 LAB — HCG, QUANTITATIVE, PREGNANCY: HCG, BETA CHAIN, QUANT, S: 63131 m[IU]/mL — AB (ref <5)

## 2015-11-07 LAB — LIPASE, BLOOD: LIPASE: 16 U/L (ref 11–51)

## 2015-11-07 MED ORDER — NITROFURANTOIN MONOHYD MACRO 100 MG PO CAPS
100.0000 mg | ORAL_CAPSULE | Freq: Once | ORAL | Status: DC
  Filled 2015-11-07: qty 1

## 2015-11-07 NOTE — ED Provider Notes (Signed)
CSN: [id number]     Arrival date & time 11/06/15  2102 History  By signing my name below, I, Sandra Mcknight, attest that this documentation has been prepared under the direction and in the presence of Shanon Rosser, MD. Electronically Signed: Georgette Mcknight, ED Scribe. 11/07/2015. 12:19 AM.   Chief Complaint  Patient presents with  . Abdominal Pain   The history is provided by the patient. No language interpreter was used.   HPI Comments: Sandra Mcknight is a 35 y.o. female who presents to the Emergency Department who is [redacted] weeks pregnant complaining of sudden onset, intermittent, sharp, periumbilical abdominal pain onset 7 pm today. The pain is not severe. Pt has associated headache. Pt also notes pressure-like vaginal pain with vaginal discharge onset several days ago. Pain is exacerbated with movement. Pt is a gestational diabetic. Pt was examined by OB rapid response at arrival and fetal monitoring was reassuring. Denies vaginal bleeding, diarrhea, vomiting.   Past Medical History:  Diagnosis Date  . Meningitis   . Obesity (BMI 35.0-39.9 without comorbidity) ([place])    History reviewed. No pertinent surgical history. Family History  Problem Relation Age of Onset  . Diabetes Mother    Social History  Substance Use Topics  . Smoking status: Former Smoker    Types: Cigarettes    Quit date: 06/23/2015  . Smokeless tobacco: Never Used  . Alcohol use Yes     Comment: socially   OB History    Gravida Para Term Preterm AB Living   _0 SAB TAB Ectopic Multiple Live Births                 Review of Systems A complete 10 system review of systems was obtained and all systems are negative except as noted in the HPI and PMH.   Allergies  Review of patient's allergies indicates no known allergies.  Home Medications   Prior to Admission medications   Medication Sig Start Date End Date Taking? Authorizing Provider  Blood Glucose Monitoring Suppl (ACCU-CHEK AVIVA PLUS) w/Device KIT 1  Device by Does not apply route once. 10/12/15  Yes Jorje Guild, NP  butalbital-acetaminophen-caffeine (FIORICET) [phone] MG tablet Take 1 tablet by mouth every 4 (four) hours as needed for headache. 11/04/15 11/03/16 Yes Myra C Hulan Fray, MD  glucose blood (ACCU-CHEK AVIVA PLUS) test strip Use as instructed to test blood sugar 4 times daily. 10/26/15  Yes Jorje Guild, NP  Lancets (ACCU-CHEK SOFT TOUCH) lancets Use as instructed 10/12/15  Yes Jorje Guild, NP  ranitidine (ZANTAC) 75 MG tablet Take 75 mg by mouth daily as needed for heartburn.   Yes Historical Provider, MD  ondansetron (ZOFRAN ODT) 4 MG disintegrating tablet Take 1 tablet (4 mg total) by mouth every 8 (eight) hours as needed for nausea or vomiting. Patient not taking: Reported on 10/18/2015 07/24/15   Leo Grosser, MD   BP 113/59 (BP Location: Right Arm)   Pulse 110   Temp 98.8 F (37.1 C) (Oral)   Resp 20   Ht _1  (1.575 m)   Wt 201 lb (91.2 kg)   LMP 06/06/2015 (Exact Date)   SpO2 96%   BMI 36.76 kg/m   Physical Exam General: Well-developed, well-nourished female in no acute distress; appearance consistent with age of record HENT: normocephalic; atraumatic Eyes: pupils equal, round and reactive to light; extraocular muscles intact Neck: supple Heart: regular rate and rhythm Lungs: clear to auscultation bilaterally Abdomen: soft;  gravid, consistent with dates; nontender; no masses or hepatosplenomegaly; bowel sounds present GU: Chaperone present; normal external genitalia; no vaginal bleeding; greenish mucoid vaginal discharge; cervix closed, thick and high; no cervical motion tenderness; no adnexal tenderness Extremities: No deformity; full range of motion; pulses normal Neurologic: Awake, alert and oriented; motor function intact in all extremities and symmetric; no facial droop Skin: Warm and dry Psychiatric: Normal mood and affect  ED Course  Procedures   MDM   Final diagnoses:  UTI (urinary tract infection)  during pregnancy, second trimester   I personally performed the services described in this documentation, which was scribed in my presence. The recorded information has been reviewed and is accurate.   Shanon Rosser, MD 11/07/15 [phone]

## 2015-11-07 NOTE — ED Notes (Signed)
RROB spoke with Dr Adrian Blackwater and told of pt's signs and symptoms/ orders to run u/a and do speculum with wet prep and for the ED provider to treat accordingly if anything comes back positive; RROB spoke to ED provider about reccomendation; he agrees with plan of care

## 2015-11-08 LAB — GC/CHLAMYDIA PROBE AMP (~~LOC~~) NOT AT ARMC
Chlamydia: NEGATIVE
NEISSERIA GONORRHEA: NEGATIVE

## 2015-11-08 NOTE — Addendum Note (Signed)
Addended by: Darrel Hoover on: 11/08/2015 10:48 AM   Modules accepted: Orders

## 2015-11-25 ENCOUNTER — Ambulatory Visit (INDEPENDENT_AMBULATORY_CARE_PROVIDER_SITE_OTHER): Payer: Medicaid Other | Admitting: Family Medicine

## 2015-11-25 ENCOUNTER — Telehealth: Payer: Self-pay | Admitting: *Deleted

## 2015-11-25 VITALS — BP 109/66 | HR 98 | Wt 203.5 lb

## 2015-11-25 DIAGNOSIS — Z3482 Encounter for supervision of other normal pregnancy, second trimester: Secondary | ICD-10-CM

## 2015-11-25 DIAGNOSIS — O24419 Gestational diabetes mellitus in pregnancy, unspecified control: Secondary | ICD-10-CM

## 2015-11-25 DIAGNOSIS — O24912 Unspecified diabetes mellitus in pregnancy, second trimester: Secondary | ICD-10-CM | POA: Diagnosis present

## 2015-11-25 LAB — POCT URINALYSIS DIP (DEVICE)
BILIRUBIN URINE: NEGATIVE
Glucose, UA: NEGATIVE mg/dL
Hgb urine dipstick: NEGATIVE
Ketones, ur: NEGATIVE mg/dL
Nitrite: NEGATIVE
PH: 7 (ref 5.0–8.0)
Protein, ur: 30 mg/dL — AB
Specific Gravity, Urine: 1.02 (ref 1.005–1.030)
UROBILINOGEN UA: 2 mg/dL — AB (ref 0.0–1.0)

## 2015-11-25 MED ORDER — GLYBURIDE 2.5 MG PO TABS
2.5000 mg | ORAL_TABLET | Freq: Every day | ORAL | 3 refills | Status: DC
Start: 2015-11-25 — End: 2016-01-20

## 2015-11-25 NOTE — Progress Notes (Signed)
Subjective:  Sandra Mcknight is a 35 y.o. G3P2002 at 6238w4d being seen today for ongoing prenatal care.  She is currently monitored for the following issues for this high-risk pregnancy and has Obesity; Supervision of normal pregnancy, antepartum; Rubella non-immune status, antepartum; Gestational diabetes mellitus (GDM) affecting pregnancy, antepartum (HCC); and Migraine on her problem list.  Patient reports no complaints.  Contractions: Not present. Vag. Bleeding: None.  Movement: Present. Denies leaking of fluid.   The following portions of the patient's history were reviewed and updated as appropriate: allergies, current medications, past family history, past medical history, past social history, past surgical history and problem list. Problem list updated.  Objective:   Vitals:   11/25/15 1116  BP: 109/66  Pulse: 98  Weight: 203 lb 8 oz (92.3 kg)    Fetal Status: Fetal Heart Rate (bpm): 152   Movement: Present     General:  Alert, oriented and cooperative. Patient is in no acute distress.  Skin: Skin is warm and dry. No rash noted.   Cardiovascular: Normal heart rate noted  Respiratory: Normal respiratory effort, no problems with respiration noted  Abdomen: Soft, gravid, appropriate for gestational age. Pain/Pressure: Present     Pelvic:  Cervical exam deferred        Extremities: Normal range of motion.  Edema: None  Mental Status: Normal mood and affect. Normal behavior. Normal judgment and thought content.   Urinalysis: Urine Protein: 1+ Urine Glucose: Negative No book---meter reviewed--BS 156, 100, 94, 132, 94, 90, 131, 113, 97, 124, 126, 88, 94, 155, 103, 89, 127, 86, 144, 92, 128, 92, 99, 113, 155, 95, 110, 144, 196 Assessment and Plan:  Pregnancy: G3P2002 at 838w4d  1. Supervision of normal pregnancy, antepartum, second trimester Continue prenatal care. 28 wk labs and TDaP at next visit.  2. Gestational diabetes mellitus (GDM) affecting pregnancy, antepartum  (HCC) Most BS are elevated due to dietary indiscretions, specifically soda, sweet tea--she is unwilling to give these up--will begin low dose glyburide at 2.5--take on 1/2 if BS go low. Fastings are WNL. - glyBURIDE (DIABETA) 2.5 MG tablet; Take 1 tablet (2.5 mg total) by mouth daily with breakfast.  Dispense: 60 tablet; Refill: 3 - US MFM OB FOLLOW UP; Future  Preterm labor symptoms and general obstetric precautions including but not limited to vaginal bleeding, contractions, leaking of fluid and fetal movement were reviewed in detail with the patient. Please refer to After Visit Summary for other counseling recommendations.  Return in 2 weeks (on 12/09/2015).   Reva Boresanya S Eddrick Dilone, MD

## 2015-11-25 NOTE — Patient Instructions (Signed)
Gestational Diabetes Mellitus Gestational diabetes mellitus, often simply referred to as gestational diabetes, is a type of diabetes that some women develop during pregnancy. In gestational diabetes, the pancreas does not make enough insulin (a hormone), the cells are less responsive to the insulin that is made (insulin resistance), or both.Normally, insulin moves sugars from food into the tissue cells. The tissue cells use the sugars for energy. The lack of insulin or the lack of normal response to insulin causes excess sugars to build up in the blood instead of going into the tissue cells. As a result, high blood sugar (hyperglycemia) develops. The effect of high sugar (glucose) levels can cause many problems.  RISK FACTORS You have an increased chance of developing gestational diabetes if you have a family history of diabetes and also have one or more of the following risk factors:  A body mass index over 30 (obesity).  A previous pregnancy with gestational diabetes.  An older age at the time of pregnancy. If blood glucose levels are kept in the normal range during pregnancy, women can have a healthy pregnancy. If your blood glucose levels are not well controlled, there may be risks to you, your unborn baby (fetus), your labor and delivery, or your newborn baby.  SYMPTOMS  If symptoms are experienced, they are much like symptoms you would normally expect during pregnancy. The symptoms of gestational diabetes include:   Increased thirst (polydipsia).  Increased urination (polyuria).  Increased urination during the night (nocturia).  Weight loss. This weight loss may be rapid.  Frequent, recurring infections.  Tiredness (fatigue).  Weakness.  Vision changes, such as blurred vision.  Fruity smell to your breath.  Abdominal pain. DIAGNOSIS Diabetes is diagnosed when blood glucose levels are increased. Your blood glucose level may be checked by one or more of the following blood  tests:  A fasting blood glucose test. You will not be allowed to eat for at least 8 hours before a blood sample is taken.  A random blood glucose test. Your blood glucose is checked at any time of the day regardless of when you ate.  An oral glucose tolerance test (OGTT). Your blood glucose is measured after you have not eaten (fasted) for 1-3 hours and then after you drink a glucose-containing beverage. Since the hormones that cause insulin resistance are highest at about 24-28 weeks of a pregnancy, an OGTT is usually performed during that time. If you have risk factors, you may be screened for undiagnosed type 2 diabetes at your first prenatal visit. TREATMENT  Gestational diabetes should be managed first with diet and exercise. Medicines may be added only if they are needed.  You will need to take diabetes medicine or insulin daily to keep blood glucose levels in the desired range.  You will need to match insulin dosing with exercise and healthy food choices. If you have gestational diabetes, your treatment goal is to maintain the following blood glucose levels:  Before meals (preprandial): at or below 95 mg/dL.  After meals (postprandial):  One hour after a meal: at or below 140 mg/dL.  Two hours after a meal: at or below 120 mg/dL. If you have pre-existing type 1 or type 2 diabetes, your treatment goal is to maintain the following blood glucose levels:  Before meals, at bedtime, and overnight: 60-99 mg/dL.  After meals: peak of 100-129 mg/dL. HOME CARE INSTRUCTIONS   Have your hemoglobin A1c level checked twice a year.  Perform daily blood glucose monitoring as directed by   your health care provider. It is common to perform frequent blood glucose monitoring.  Monitor urine ketones when you are ill and as directed by your health care provider.  Take your diabetes medicine and insulin as directed by your health care provider to maintain your blood glucose level in the desired  range.  Never run out of diabetes medicine or insulin. It is needed every day.  Adjust insulin based on your intake of carbohydrates. Carbohydrates can raise blood glucose levels but need to be included in your diet. Carbohydrates provide vitamins, minerals, and fiber which are an essential part of a healthy diet. Carbohydrates are found in fruits, vegetables, whole grains, dairy products, legumes, and foods containing added sugars.  Eat healthy foods. Alternate 3 meals with 3 snacks.  Maintain a healthy weight gain. The usual total expected weight gain varies according to your prepregnancy body mass index (BMI).  Carry a medical alert card or wear your medical alert jewelry.  Carry a 15-gram carbohydrate snack with you at all times to treat low blood glucose (hypoglycemia). Some examples of 15-gram carbohydrate snacks include:  Glucose tablets, 3 or 4.  Glucose gel, 15-gram tube.  Raisins, 2 tablespoons (24 g).  Jelly beans, 6.  Animal crackers, 8.  Fruit juice, regular soda, or low-fat milk, 4 ounces (120 mL).  Gummy treats, 9.  Recognize hypoglycemia. Hypoglycemia during pregnancy occurs with blood glucose levels of 60 mg/dL and below. The risk for hypoglycemia increases when fasting or skipping meals, during or after intense exercise, and during sleep. Hypoglycemia symptoms can include:  Tremors or shakes.  Decreased ability to concentrate.  Sweating.  Increased heart rate.  Headache.  Dry mouth.  Hunger.  Irritability.  Anxiety.  Restless sleep.  Altered speech or coordination.  Confusion.  Treat hypoglycemia promptly. If you are alert and able to safely swallow, follow the 15:15 rule:  Take 15-20 grams of rapid-acting glucose or carbohydrate. Rapid-acting options include glucose gel, glucose tablets, or 4 ounces (120 mL) of fruit juice, regular soda, or low-fat milk.  Check your blood glucose level 15 minutes after taking the glucose.  Take 15-20  grams more of glucose if the repeat blood glucose level is still 70 mg/dL or below.  Eat a meal or snack within 1 hour once blood glucose levels return to normal.  Be alert to polyuria (excess urination) and polydipsia (excess thirst) which are early signs of hyperglycemia. An early awareness of hyperglycemia allows for prompt treatment. Treat hyperglycemia as directed by your health care provider.  Engage in at least 30 minutes of physical activity a day or as directed by your health care provider. Ten minutes of physical activity timed 30 minutes after each meal is encouraged to control postprandial blood glucose levels.  Adjust your insulin dosing and food intake as needed if you start a new exercise or sport.  Follow your sick-day plan at any time you are unable to eat or drink as usual.  Avoid tobacco and alcohol use.  Keep all follow-up visits as directed by your health care provider.  Follow the advice of your health care provider regarding your prenatal and post-delivery (postpartum) appointments, meal planning, exercise, medicines, vitamins, blood tests, other medical tests, and physical activities.  Perform daily skin and foot care. Examine your skin and feet daily for cuts, bruises, redness, nail problems, bleeding, blisters, or sores.  Brush your teeth and gums at least twice a day and floss at least once a day. Follow up with your dentist   regularly.  Schedule an eye exam during the first trimester of your pregnancy or as directed by your health care provider.  Share your diabetes management plan with your workplace or school.  Stay up-to-date with immunizations.  Learn to manage stress.  Obtain ongoing diabetes education and support as needed.  Learn about and consider breastfeeding your baby.  You should have your blood sugar level checked 6-12 weeks after delivery. This is done with an oral glucose tolerance test (OGTT). SEEK MEDICAL CARE IF:   You are unable to  eat food or drink fluids for more than 6 hours.  You have nausea and vomiting for more than 6 hours.  You have a blood glucose level of 200 mg/dL and you have ketones in your urine.  There is a change in mental status.  You develop vision problems.  You have a persistent headache.  You have upper abdominal pain or discomfort.  You develop an additional serious illness.  You have diarrhea for more than 6 hours.  You have been sick or have had a fever for a couple of days and are not getting better. SEEK IMMEDIATE MEDICAL CARE IF:   You have difficulty breathing.  You no longer feel the baby moving.  You are bleeding or have discharge from your vagina.  You start having premature contractions or labor. MAKE SURE YOU:  Understand these instructions.  Will watch your condition.  Will get help right away if you are not doing well or get worse.   This information is not intended to replace advice given to you by your health care provider. Make sure you discuss any questions you have with your health care provider.   Document Released: 07/10/2000 Document Revised: 04/24/2014 Document Reviewed: 10/31/2011 Elsevier Interactive Patient Education 2016 [employer]  Breastfeeding Deciding to breastfeed is one of the best choices you can make for you and your baby. A change in hormones during pregnancy causes your breast tissue to grow and increases the number and size of your milk ducts. These hormones also allow proteins, sugars, and fats from your blood supply to make breast milk in your milk-producing glands. Hormones prevent breast milk from being released before your baby is born as well as prompt milk flow after birth. Once breastfeeding has begun, thoughts of your baby, as well as his or her sucking or crying, can stimulate the release of milk from your milk-producing glands.  BENEFITS OF BREASTFEEDING For Your Baby  Your first milk (colostrum) helps your baby's digestive  system function better.  There are antibodies in your milk that help your baby fight off infections.  Your baby has a lower incidence of asthma, allergies, and sudden infant death syndrome.  The nutrients in breast milk are better for your baby than infant formulas and are designed uniquely for your baby's needs.  Breast milk improves your baby's brain development.  Your baby is less likely to develop other conditions, such as childhood obesity, asthma, or type 2 diabetes mellitus. For You  Breastfeeding helps to create a very special bond between you and your baby.  Breastfeeding is convenient. Breast milk is always available at the correct temperature and costs nothing.  Breastfeeding helps to burn calories and helps you lose the weight gained during pregnancy.  Breastfeeding makes your uterus contract to its prepregnancy size faster and slows bleeding (lochia) after you give birth.   Breastfeeding helps to lower your risk of developing type 2 diabetes mellitus, osteoporosis, and breast or ovarian cancer   later in life. SIGNS THAT YOUR BABY IS HUNGRY Early Signs of Hunger  Increased alertness or activity.  Stretching.  Movement of the head from side to side.  Movement of the head and opening of the mouth when the corner of the mouth or cheek is stroked (rooting).  Increased sucking sounds, smacking lips, cooing, sighing, or squeaking.  Hand-to-mouth movements.  Increased sucking of fingers or hands. Late Signs of Hunger  Fussing.  Intermittent crying. Extreme Signs of Hunger Signs of extreme hunger will require calming and consoling before your baby will be able to breastfeed successfully. Do not wait for the following signs of extreme hunger to occur before you initiate breastfeeding:  Restlessness.  A loud, strong cry.  Screaming. BREASTFEEDING BASICS Breastfeeding Initiation  Find a comfortable place to sit or lie down, with your neck and back well  supported.  Place a pillow or rolled up blanket under your baby to bring him or her to the level of your breast (if you are seated). Nursing pillows are specially designed to help support your arms and your baby while you breastfeed.  Make sure that your baby's abdomen is facing your abdomen.  Gently massage your breast. With your fingertips, massage from your chest wall toward your nipple in a circular motion. This encourages milk flow. You may need to continue this action during the feeding if your milk flows slowly.  Support your breast with 4 fingers underneath and your thumb above your nipple. Make sure your fingers are well away from your nipple and your baby's mouth.  Stroke your baby's lips gently with your finger or nipple.  When your baby's mouth is open wide enough, quickly bring your baby to your breast, placing your entire nipple and as much of the colored area around your nipple (areola) as possible into your baby's mouth.  More areola should be visible above your baby's upper lip than below the lower lip.  Your baby's tongue should be between his or her lower gum and your breast.  Ensure that your baby's mouth is correctly positioned around your nipple (latched). Your baby's lips should create a seal on your breast and be turned out (everted).  It is common for your baby to suck about 2-3 minutes in order to start the flow of breast milk. Latching Teaching your baby how to latch on to your breast properly is very important. An improper latch can cause nipple pain and decreased milk supply for you and poor weight gain in your baby. Also, if your baby is not latched onto your nipple properly, he or she may swallow some air during feeding. This can make your baby fussy. Burping your baby when you switch breasts during the feeding can help to get rid of the air. However, teaching your baby to latch on properly is still the best way to prevent fussiness from swallowing air while  breastfeeding. Signs that your baby has successfully latched on to your nipple:  Silent tugging or silent sucking, without causing you pain.  Swallowing heard between every 3-4 sucks.  Muscle movement above and in front of his or her ears while sucking. Signs that your baby has not successfully latched on to nipple:  Sucking sounds or smacking sounds from your baby while breastfeeding.  Nipple pain. If you think your baby has not latched on correctly, slip your finger into the corner of your baby's mouth to break the suction and place it between your baby's gums. Attempt breastfeeding initiation again. Signs   of Successful Breastfeeding Signs from your baby:  A gradual decrease in the number of sucks or complete cessation of sucking.  Falling asleep.  Relaxation of his or her body.  Retention of a small amount of milk in his or her mouth.  Letting go of your breast by himself or herself. Signs from you:  Breasts that have increased in firmness, weight, and size 1-3 hours after feeding.  Breasts that are softer immediately after breastfeeding.  Increased milk volume, as well as a change in milk consistency and color by the fifth day of breastfeeding.  Nipples that are not sore, cracked, or bleeding. Signs That Your Baby is Getting Enough Milk  Wetting at least 3 diapers in a 24-hour period. The urine should be clear and pale yellow by age 5 days.  At least 3 stools in a 24-hour period by age 5 days. The stool should be soft and yellow.  At least 3 stools in a 24-hour period by age 7 days. The stool should be seedy and yellow.  No loss of weight greater than 10% of birth weight during the first 3 days of age.  Average weight gain of 4-7 ounces (113-198 g) per week after age 4 days.  Consistent daily weight gain by age 5 days, without weight loss after the age of 2 weeks. After a feeding, your baby may spit up a small amount. This is common. BREASTFEEDING FREQUENCY AND  DURATION Frequent feeding will help you make more milk and can prevent sore nipples and breast engorgement. Breastfeed when you feel the need to reduce the fullness of your breasts or when your baby shows signs of hunger. This is called "breastfeeding on demand." Avoid introducing a pacifier to your baby while you are working to establish breastfeeding (the first 4-6 weeks after your baby is born). After this time you may choose to use a pacifier. Research has shown that pacifier use during the first year of a baby's life decreases the risk of sudden infant death syndrome (SIDS). Allow your baby to feed on each breast as long as he or she wants. Breastfeed until your baby is finished feeding. When your baby unlatches or falls asleep while feeding from the first breast, offer the second breast. Because newborns are often sleepy in the first few weeks of life, you may need to awaken your baby to get him or her to feed. Breastfeeding times will vary from baby to baby. However, the following rules can serve as a guide to help you ensure that your baby is properly fed:  Newborns (babies 4 weeks of age or younger) may breastfeed every 1-3 hours.  Newborns should not go longer than 3 hours during the day or 5 hours during the night without breastfeeding.  You should breastfeed your baby a minimum of 8 times in a 24-hour period until you begin to introduce solid foods to your baby at around 6 months of age. BREAST MILK PUMPING Pumping and storing breast milk allows you to ensure that your baby is exclusively fed your breast milk, even at times when you are unable to breastfeed. This is especially important if you are going back to work while you are still breastfeeding or when you are not able to be present during feedings. Your lactation consultant can give you guidelines on how long it is safe to store breast milk. A breast pump is a machine that allows you to pump milk from your breast into a sterile bottle.  The pumped   breast milk can then be stored in a refrigerator or freezer. Some breast pumps are operated by hand, while others use electricity. Ask your lactation consultant which type will work best for you. Breast pumps can be purchased, but some hospitals and breastfeeding support groups lease breast pumps on a monthly basis. A lactation consultant can teach you how to hand express breast milk, if you prefer not to use a pump. CARING FOR YOUR BREASTS WHILE YOU BREASTFEED Nipples can become dry, cracked, and sore while breastfeeding. The following recommendations can help keep your breasts moisturized and healthy:  Avoid using soap on your nipples.  Wear a supportive bra. Although not required, special nursing bras and tank tops are designed to allow access to your breasts for breastfeeding without taking off your entire bra or top. Avoid wearing underwire-style bras or extremely tight bras.  Air dry your nipples for 3-4minutes after each feeding.  Use only cotton bra pads to absorb leaked breast milk. Leaking of breast milk between feedings is normal.  Use lanolin on your nipples after breastfeeding. Lanolin helps to maintain your skin's normal moisture barrier. If you use pure lanolin, you do not need to wash it off before feeding your baby again. Pure lanolin is not toxic to your baby. You may also hand express a few drops of breast milk and gently massage that milk into your nipples and allow the milk to air dry. In the first few weeks after giving birth, some women experience extremely full breasts (engorgement). Engorgement can make your breasts feel heavy, warm, and tender to the touch. Engorgement peaks within 3-5 days after you give birth. The following recommendations can help ease engorgement:  Completely empty your breasts while breastfeeding or pumping. You may want to start by applying warm, moist heat (in the shower or with warm water-soaked hand towels) just before feeding or pumping.  This increases circulation and helps the milk flow. If your baby does not completely empty your breasts while breastfeeding, pump any extra milk after he or she is finished.  Wear a snug bra (nursing or regular) or tank top for 1-2 days to signal your body to slightly decrease milk production.  Apply ice packs to your breasts, unless this is too uncomfortable for you.  Make sure that your baby is latched on and positioned properly while breastfeeding. If engorgement persists after 48 hours of following these recommendations, contact your health care provider or a lactation consultant. OVERALL HEALTH CARE RECOMMENDATIONS WHILE BREASTFEEDING  Eat healthy foods. Alternate between meals and snacks, eating 3 of each per day. Because what you eat affects your breast milk, some of the foods may make your baby more irritable than usual. Avoid eating these foods if you are sure that they are negatively affecting your baby.  Drink milk, fruit juice, and water to satisfy your thirst (about 10 glasses a day).  Rest often, relax, and continue to take your prenatal vitamins to prevent fatigue, stress, and anemia.  Continue breast self-awareness checks.  Avoid chewing and smoking tobacco. Chemicals from cigarettes that pass into breast milk and exposure to secondhand smoke may harm your baby.  Avoid alcohol and drug use, including marijuana. Some medicines that may be harmful to your baby can pass through breast milk. It is important to ask your health care provider before taking any medicine, including all over-the-counter and prescription medicine as well as vitamin and herbal supplements. It is possible to become pregnant while breastfeeding. If birth control is desired, ask   your health care provider about options that will be safe for your baby. SEEK MEDICAL CARE IF:  You feel like you want to stop breastfeeding or have become frustrated with breastfeeding.  You have painful breasts or  nipples.  Your nipples are cracked or bleeding.  Your breasts are red, tender, or warm.  You have a swollen area on either breast.  You have a fever or chills.  You have nausea or vomiting.  You have drainage other than breast milk from your nipples.  Your breasts do not become full before feedings by the fifth day after you give birth.  You feel sad and depressed.  Your baby is too sleepy to eat well.  Your baby is having trouble sleeping.   Your baby is wetting less than 3 diapers in a 24-hour period.  Your baby has less than 3 stools in a 24-hour period.  Your baby's skin or the white part of his or her eyes becomes yellow.   Your baby is not gaining weight by 5 days of age. SEEK IMMEDIATE MEDICAL CARE IF:  Your baby is overly tired (lethargic) and does not want to wake up and feed.  Your baby develops an unexplained fever.   This information is not intended to replace advice given to you by your health care provider. Make sure you discuss any questions you have with your health care provider.   Document Released: 04/03/2005 Document Revised: 12/23/2014 Document Reviewed: 09/25/2012 Elsevier Interactive Patient Education 2016 [employer]  

## 2015-11-25 NOTE — Telephone Encounter (Signed)
Called patient to let [facility] know [facility] @ 0930. Understanding voiced

## 2015-11-25 NOTE — Progress Notes (Signed)
BS 156, 100, 94, 132, 94, 90, 131, 113, 97, 124, 126, 88, 94, 155, 103, 89, 127, 86, 144, 92, 128, 92, 99, 113, 155, 95, 110, 144, 196

## 2015-12-14 ENCOUNTER — Encounter (HOSPITAL_COMMUNITY): Payer: Self-pay

## 2015-12-14 ENCOUNTER — Ambulatory Visit (HOSPITAL_COMMUNITY)
Admission: RE | Admit: 2015-12-14 | Discharge: 2015-12-14 | Disposition: A | Discharge status: Home / Self Care | Payer: Medicaid Other | Source: Ambulatory Visit | Attending: Family Medicine | Admitting: Family Medicine

## 2015-12-14 DIAGNOSIS — Z3482 Encounter for supervision of other normal pregnancy, second trimester: Secondary | ICD-10-CM

## 2015-12-14 DIAGNOSIS — O24415 Gestational diabetes mellitus in pregnancy, controlled by oral hypoglycemic drugs: Secondary | ICD-10-CM | POA: Diagnosis not present

## 2015-12-14 DIAGNOSIS — O99212 Obesity complicating pregnancy, second trimester: Secondary | ICD-10-CM | POA: Diagnosis present

## 2015-12-14 DIAGNOSIS — O9989 Other specified diseases and conditions complicating pregnancy, childbirth and the puerperium: Secondary | ICD-10-CM

## 2015-12-14 DIAGNOSIS — O09899 Supervision of other high risk pregnancies, unspecified trimester: Secondary | ICD-10-CM

## 2015-12-14 DIAGNOSIS — Z3A27 27 weeks gestation of pregnancy: Secondary | ICD-10-CM | POA: Insufficient documentation

## 2015-12-14 DIAGNOSIS — Z283 Underimmunization status: Secondary | ICD-10-CM

## 2015-12-14 DIAGNOSIS — O24419 Gestational diabetes mellitus in pregnancy, unspecified control: Secondary | ICD-10-CM

## 2015-12-15 ENCOUNTER — Ambulatory Visit (INDEPENDENT_AMBULATORY_CARE_PROVIDER_SITE_OTHER): Payer: Medicaid Other | Admitting: Obstetrics and Gynecology

## 2015-12-15 VITALS — BP 105/63 | HR 102 | Wt 202.8 lb

## 2015-12-15 DIAGNOSIS — O0993 Supervision of high risk pregnancy, unspecified, third trimester: Secondary | ICD-10-CM

## 2015-12-15 DIAGNOSIS — O24419 Gestational diabetes mellitus in pregnancy, unspecified control: Secondary | ICD-10-CM

## 2015-12-15 DIAGNOSIS — O24912 Unspecified diabetes mellitus in pregnancy, second trimester: Secondary | ICD-10-CM

## 2015-12-15 LAB — POCT URINALYSIS DIP (DEVICE)
Bilirubin Urine: NEGATIVE
GLUCOSE, UA: 100 mg/dL — AB
Hgb urine dipstick: NEGATIVE
KETONES UR: NEGATIVE mg/dL
Nitrite: NEGATIVE
Protein, ur: NEGATIVE mg/dL
SPECIFIC GRAVITY, URINE: 1.015 (ref 1.005–1.030)
Urobilinogen, UA: 1 mg/dL (ref 0.0–1.0)
pH: 6.5 (ref 5.0–8.0)

## 2015-12-15 NOTE — Progress Notes (Signed)
Patient has some concerns about the Glyburide causing her sugars to drop low 40s.

## 2015-12-15 NOTE — Progress Notes (Signed)
Subjective:  Sandra Mcknight is a 35 y.o. G3P2002 at 6950w3d being seen today for ongoing prenatal care.  She is currently monitored for the following issues for this high-risk pregnancy and has Obesity; Supervision of high-risk pregnancy; Rubella non-immune status, antepartum; Gestational diabetes mellitus (GDM) affecting pregnancy, antepartum (HCC); and Migraine on her problem list.  Patient reports no complaints.  Contractions: Not present.  .  Movement: Present. Denies leaking of fluid.   The following portions of the patient's history were reviewed and updated as appropriate: allergies, current medications, past family history, past medical history, past social history, past surgical history and problem list. Problem list updated.  Objective:   Vitals:   12/15/15 1554  BP: 105/63  Pulse: (!) 102  Weight: 202 lb 12.8 oz (92 kg)    Fetal Status: Fetal Heart Rate (bpm): 143   Movement: Present     General:  Alert, oriented and cooperative. Patient is in no acute distress.  Skin: Skin is warm and dry. No rash noted.   Cardiovascular: Normal heart rate noted  Respiratory: Normal respiratory effort, no problems with respiration noted  Abdomen: Soft, gravid, appropriate for gestational age. Pain/Pressure: Present     Pelvic:  Cervical exam deferred        Extremities: Normal range of motion.  Edema: None  Mental Status: Normal mood and affect. Normal behavior. Normal judgment and thought content.   Urinalysis:      Assessment and Plan:  Pregnancy: G3P2002 at 7150w3d  1. Gestational diabetes mellitus (GDM) affecting pregnancy, antepartum (HCC) BS have improved some with Glyburide. However pt is not taking every day. Also not following diet strictly. Importance of taking medication qd and following diet reviewed and discussed with pt. Pt verbalized understanding.   2. Supervision of high-risk pregnancy, third trimester - HIV antibody - RPR - CBC  Preterm labor symptoms and general  obstetric precautions including but not limited to vaginal bleeding, contractions, leaking of fluid and fetal movement were reviewed in detail with the patient. Please refer to After Visit Summary for other counseling recommendations.  No Follow-up on file.   Sandra StaggersMichael L Carlo Lorson, MD

## 2015-12-30 ENCOUNTER — Ambulatory Visit (INDEPENDENT_AMBULATORY_CARE_PROVIDER_SITE_OTHER): Payer: Medicaid Other | Admitting: Obstetrics & Gynecology

## 2015-12-30 ENCOUNTER — Encounter: Payer: Self-pay | Admitting: Obstetrics & Gynecology

## 2015-12-30 VITALS — BP 115/60 | HR 106 | Temp 99.4°F | Wt 204.2 lb

## 2015-12-30 DIAGNOSIS — O9989 Other specified diseases and conditions complicating pregnancy, childbirth and the puerperium: Secondary | ICD-10-CM | POA: Diagnosis not present

## 2015-12-30 DIAGNOSIS — O0993 Supervision of high risk pregnancy, unspecified, third trimester: Secondary | ICD-10-CM

## 2015-12-30 DIAGNOSIS — G4489 Other headache syndrome: Secondary | ICD-10-CM

## 2015-12-30 DIAGNOSIS — O24919 Unspecified diabetes mellitus in pregnancy, unspecified trimester: Secondary | ICD-10-CM | POA: Diagnosis not present

## 2015-12-30 DIAGNOSIS — Z23 Encounter for immunization: Secondary | ICD-10-CM | POA: Diagnosis not present

## 2015-12-30 DIAGNOSIS — O24419 Gestational diabetes mellitus in pregnancy, unspecified control: Secondary | ICD-10-CM

## 2015-12-30 LAB — POCT URINALYSIS DIP (DEVICE)
GLUCOSE, UA: NEGATIVE mg/dL
Hgb urine dipstick: NEGATIVE
KETONES UR: 40 mg/dL — AB
Leukocytes, UA: NEGATIVE
Nitrite: NEGATIVE
Protein, ur: 30 mg/dL — AB
Urobilinogen, UA: 2 mg/dL — ABNORMAL HIGH (ref 0.0–1.0)
pH: 6 (ref 5.0–8.0)

## 2015-12-30 MED ORDER — BUTALBITAL-APAP-CAFFEINE 50-325-40 MG PO TABS: 1.0000 | ORAL_TABLET | ORAL | 0 refills | Status: DC | PRN

## 2015-12-30 NOTE — Patient Instructions (Signed)

## 2015-12-30 NOTE — Progress Notes (Signed)
PRENATAL VISIT NOTE  Subjective:  Sandra Mcknight is a 35 y.o. G3P2002 at 2878w4d being seen today for ongoing prenatal care.  She is currently monitored for the following issues for this high-risk pregnancy and has Obesity; Supervision of high-risk pregnancy; Rubella non-immune status, antepartum; Gestational diabetes mellitus (GDM) affecting pregnancy, antepartum (HCC); and Migraine on her problem list.  Patient reports headache, no bleeding, no leaking and occasional contractions.  Contractions: Irregular. Vag. Bleeding: None.  Movement: Present. Denies leaking of fluid.   Pt states that she had abdominal tightness 2 days ago that lasted for a day.  She attributes this to "over doing it" at work. Has not had any since.  Pt endorses migraines about 3x per week.  She has run out of the Fioricet and tylenol does not adequately control the pain.  She says that she has not felt symptoms of hypoglycemia often but has had a few episodes of weakness and fatigue in the afternoons.  She eats a snack at this time and feels better afterwards.  The following portions of the patient's history were reviewed and updated as appropriate: allergies, current medications, past family history, past medical history, past social history, past surgical history and problem list. Problem list updated.  Objective:   Vitals:   12/30/15 1053  BP: 115/60  Pulse: (!) 106  Temp: 99.4 F (37.4 C)  Weight: 204 lb 3.2 oz (92.6 kg)   Blood Sugars:  9/1: 99 (am) 87 (afternoon) 155 (pm) 9/2: 92 68 203 9/3:      92 111 9/4:      94  113 9/5:      66  116 9/6: 90  84  134 9/7:  93   89 9/9:  93  94  101  Fetal Status: Fetal Heart Rate (bpm): 142   Movement: Present     General:  Alert, oriented and cooperative. Patient is in no acute distress.  Skin: Skin is warm and dry. No rash noted.   Cardiovascular: Normal heart rate noted  Respiratory: Normal respiratory effort, no problems with respiration noted    Abdomen: Soft, gravid, appropriate for gestational age. Pain/Pressure: Present     Pelvic:  Cervical exam deferred        Extremities: Normal range of motion.  Edema: None  Mental Status: Normal mood and affect. Normal behavior. Normal judgment and thought content.   Urinalysis:      Results for orders placed or performed in visit on 12/30/15 (from the past 24 hour(s))  POCT urinalysis dip (device)     Status: Abnormal   Collection Time: 12/30/15 11:09 AM  Result Value Ref Range   Glucose, UA NEGATIVE NEGATIVE mg/dL   Bilirubin Urine SMALL (A) NEGATIVE   Ketones, ur 40 (A) NEGATIVE mg/dL   Specific Gravity, Urine >=1.030 1.005 - 1.030   Hgb urine dipstick NEGATIVE NEGATIVE   pH 6.0 5.0 - 8.0   Protein, ur 30 (A) NEGATIVE mg/dL   Urobilinogen, UA 2.0 (H) 0.0 - 1.0 mg/dL   Nitrite NEGATIVE NEGATIVE   Leukocytes, UA NEGATIVE NEGATIVE     Assessment and Plan:  Pregnancy: G3P2002 at 3278w4d  1. Gestational diabetes mellitus (GDM) affecting pregnancy, antepartum (HCC) -blood sugars controlled on Glyburide  2. Supervision of high-risk pregnancy, third trimester -pt is scheduled for US MFM OB Follow up on 9/29 -Tdap vaccine given - Flu Vaccine QUAD 36+ mos IM -pt encouraged to drink plenty of water and eat consistently  3. Migraine -Fioricet  refill  Preterm labor symptoms and general obstetric precautions including but not limited to vaginal bleeding, contractions, leaking of fluid and fetal movement were reviewed in detail with the patient. Please refer to After Visit Summary for other counseling recommendations.  Return in about 2 weeks (around 01/13/2016).  Kato Wieczorek Aletha Halim, Student-PA

## 2016-01-11 ENCOUNTER — Other Ambulatory Visit (HOSPITAL_COMMUNITY): Payer: Self-pay | Admitting: Maternal and Fetal Medicine

## 2016-01-11 ENCOUNTER — Ambulatory Visit (HOSPITAL_COMMUNITY)
Admission: RE | Admit: 2016-01-11 | Discharge: 2016-01-11 | Disposition: A | Discharge status: Home / Self Care | Payer: Medicaid Other | Source: Ambulatory Visit | Attending: Family Medicine | Admitting: Family Medicine

## 2016-01-11 ENCOUNTER — Encounter (HOSPITAL_COMMUNITY): Payer: Self-pay

## 2016-01-11 DIAGNOSIS — O99213 Obesity complicating pregnancy, third trimester: Secondary | ICD-10-CM

## 2016-01-11 DIAGNOSIS — O24415 Gestational diabetes mellitus in pregnancy, controlled by oral hypoglycemic drugs: Secondary | ICD-10-CM

## 2016-01-11 DIAGNOSIS — O24419 Gestational diabetes mellitus in pregnancy, unspecified control: Secondary | ICD-10-CM

## 2016-01-11 DIAGNOSIS — Z3A31 31 weeks gestation of pregnancy: Secondary | ICD-10-CM

## 2016-01-11 DIAGNOSIS — Z3A35 35 weeks gestation of pregnancy: Secondary | ICD-10-CM

## 2016-01-11 HISTORY — DX: Gestational diabetes mellitus in pregnancy, unspecified control: O24.419

## 2016-01-14 ENCOUNTER — Ambulatory Visit (INDEPENDENT_AMBULATORY_CARE_PROVIDER_SITE_OTHER): Payer: Medicaid Other | Admitting: Physician Assistant

## 2016-01-14 ENCOUNTER — Encounter: Payer: Self-pay | Admitting: Physician Assistant

## 2016-01-14 VITALS — BP 119/78 | HR 111 | Wt 204.0 lb

## 2016-01-14 DIAGNOSIS — G43009 Migraine without aura, not intractable, without status migrainosus: Secondary | ICD-10-CM | POA: Diagnosis not present

## 2016-01-14 DIAGNOSIS — O26893 Other specified pregnancy related conditions, third trimester: Secondary | ICD-10-CM

## 2016-01-14 DIAGNOSIS — R51 Headache: Principal | ICD-10-CM

## 2016-01-14 MED ORDER — CYCLOBENZAPRINE HCL 10 MG PO TABS: 10.0000 mg | ORAL_TABLET | Freq: Three times a day (TID) | ORAL | 1 refills | Status: DC | PRN

## 2016-01-14 MED ORDER — PROMETHAZINE HCL 25 MG PO TABS: 25.0000 mg | ORAL_TABLET | Freq: Four times a day (QID) | ORAL | 0 refills | Status: DC | PRN

## 2016-01-14 NOTE — Progress Notes (Signed)
History:  Sandra Mcknight is a 35 y.o. G9E0100 who presents to clinic today for evaluation of headaches.  They are located on top of her head but sometimes move down into her neck, moderate to severe intensity, throbbing.  It is worse with movement and both lights and noises are bothersome.  There is no nausea and vomitting.  She sees black spots during the headache but no aura or prodrome noted.  They last up to 3 days.  She has had them since childhood.   When they come on she lies down in a dark room and tries to stay very still.  Fioricet has been some help during this pregnancy but did not help this last headache.  When not pregnant, she uses BC powder.  They occur about once a week.   Triggers include stress.  She believes they are more common during this pregnancy.     HIT6: Number of days in the last 4 weeks with:  Severe headache: 3 Moderate headache: 4 Mild headache: 0  No headache: 21   Past Medical History:  Diagnosis Date  . Gestational diabetes   . Meningitis   . Obesity (BMI 35.0-39.9 without comorbidity) ([place])     Social History   Social History  . Marital status: Single    Spouse name: N/A  . Number of children: N/A  . Years of education: N/A   Occupational History  . Not on file.   Social History Main Topics  . Smoking status: Former Smoker    Types: Cigarettes    Quit date: 06/23/2015  . Smokeless tobacco: Never Used  . Alcohol use Yes     Comment: socially  . Drug use: No  . Sexual activity: Yes    Birth control/ protection: None   Other Topics Concern  . Not on file   Social History Narrative  . No narrative on file    Family History  Problem Relation Age of Onset  . Diabetes Mother     No Known Allergies  Current Outpatient Prescriptions on File Prior to Visit  Medication Sig Dispense Refill  . Blood Glucose Monitoring Suppl (ACCU-CHEK AVIVA PLUS) w/Device KIT 1 Device by Does not apply route once. 1 kit 0  .  butalbital-acetaminophen-caffeine (FIORICET) 50-325-40 MG tablet Take 1 tablet by mouth every 4 (four) hours as needed for headache. 20 tablet 0  . glucose blood (ACCU-CHEK AVIVA PLUS) test strip Use as instructed to test blood sugar 4 times daily. 100 each 12  . glyBURIDE (DIABETA) 2.5 MG tablet Take 1 tablet (2.5 mg total) by mouth daily with breakfast. 60 tablet 3  . Lancets (ACCU-CHEK SOFT TOUCH) lancets Use as instructed 100 each 12  . ranitidine (ZANTAC) 75 MG tablet Take 75 mg by mouth daily as needed for heartburn.     No current facility-administered medications on file prior to visit.      Review of Systems:  All pertinent positive/negative included in HPI, all other review of systems are negative  Objective:  Physical Exam BP 119/78   Pulse (!) 111   Wt 204 lb (92.5 kg)   LMP 06/06/2015 (Exact Date)   BMI 37.31 kg/m  CONSTITUTIONAL: Well-developed, well-nourished female in no acute distress.  EYES: EOM intact ENT: Normocephalic CARDIOVASCULAR: Regular rate and rhythm with no adventitious sounds.  RESPIRATORY: Normal rate. Clear to auscultation bilaterally.  MUSCULOSKELETAL: Normal ROM, strength equal bilaterally,  muscle spasm noted SKIN: Warm, dry without erythema  NEUROLOGICAL: Alert, oriented, CN II-XII  grossly intact, Appropriate balance, No dysmetria, Sensation equal bilaterally, Romberg negative.   PSYCH: Normal behavior, mood   Assessment & Plan:  Assessment: Headache in pregnancy, third trimester  Migraine without aura and without status migrainosus, not intractable    Plan: May continue fioricet for migraine prn.  Do not exceed 4/week Flexeril for mild HA.  May help with sleep Try* to work on regulating schedule - difficult when working 3rd shift Phenergan prn severe HA.  Plan to sleep with this medication.   Follow-up in 1 months or sooner PRN  Paticia Stack, PA-C 01/14/2016 9:52 AM

## 2016-01-14 NOTE — Patient Instructions (Signed)

## 2016-01-20 ENCOUNTER — Ambulatory Visit (INDEPENDENT_AMBULATORY_CARE_PROVIDER_SITE_OTHER): Payer: Medicaid Other | Admitting: Obstetrics and Gynecology

## 2016-01-20 VITALS — BP 107/53 | HR 105 | Wt 205.0 lb

## 2016-01-20 DIAGNOSIS — O24913 Unspecified diabetes mellitus in pregnancy, third trimester: Secondary | ICD-10-CM

## 2016-01-20 DIAGNOSIS — O24419 Gestational diabetes mellitus in pregnancy, unspecified control: Secondary | ICD-10-CM

## 2016-01-20 DIAGNOSIS — O0993 Supervision of high risk pregnancy, unspecified, third trimester: Secondary | ICD-10-CM

## 2016-01-20 MED ORDER — GLYBURIDE 2.5 MG PO TABS: ORAL_TABLET | ORAL | 3 refills | Status: DC

## 2016-01-20 NOTE — Progress Notes (Signed)
Prenatal Visit Note Date: 01/20/2016 [facility]  Subjective:  Sandra Mcknight is a 35 y.o. G3P2002 at 530w4d being seen today for ongoing prenatal care.  She is currently monitored for the following issues for this high-risk pregnancy and has Headache in pregnancy; Obesity; Supervision of high-risk pregnancy; Rubella non-immune status, antepartum; Gestational diabetes mellitus (GDM) affecting pregnancy, antepartum; and Migraine on her problem list.  Patient reports no complaints.   Contractions: Not present.  .  Movement: Present. Denies leaking of fluid.   The following portions of the patient's history were reviewed and updated as appropriate: allergies, current medications, past family history, past medical history, past social history, past surgical history and problem list. Problem list updated.  Objective:   Vitals:   01/20/16 0746  BP: (!) 107/53  Pulse: (!) 105  Weight: 205 lb (93 kg)    Fetal Status: Fetal Heart Rate (bpm): 136   Movement: Present     General:  Alert, oriented and cooperative. Patient is in no acute distress.  Skin: Skin is warm and dry. No rash noted.   Cardiovascular: Normal heart rate noted  Respiratory: Normal respiratory effort, no problems with respiration noted  Abdomen: Soft, gravid, appropriate for gestational age. Pain/Pressure: Present     Pelvic:  Cervical exam deferred        Extremities: Normal range of motion.  Edema: None  Mental Status: Normal mood and affect. Normal behavior. Normal judgment and thought content.   Urinalysis:      Assessment and Plan:  Pregnancy: G3P2002 at 3730w4d  1. Gestational diabetes mellitus (GDM) affecting pregnancy, antepartum Patient currently on glyburide 2.5mg  PO in the morning. She works nights 3-4x/week which makes it hard to consistently keep track of her sugars and interpret them and she doesn't have a BS log book 10/4  [phone] 141 1000 95 0114 98 10/3 [phone]  185 0915 82 0115 96 9/27 2015 171 1245 145 1025 85  I gave her a book and asked to try and consistently keep track of her values and write down when she works so we can try and interpet them better, but I'd recommend doing 2.5 with her morning meal and 1.25 with evening meal and to keep a close eye on high or low values. She wasn't set up for 2x/week testing already and she can't stay and 1st date she can come is on Monday so will do nst then and 1wk nst/afi and ROB to review log. Normal EFW with large AC and normal AFI on 9/26 mfm scan. Needs repeat scheduled at [facility].   2. Supervision of high risk pregnancy in third trimester Routine care. Ask about BTL nv.   Preterm labor symptoms and general obstetric precautions including but not limited to vaginal bleeding, contractions, leaking of fluid and fetal movement were reviewed in detail with the patient. Please refer to After Visit Summary for other counseling recommendations.  Return in about 1 week (around 01/27/2016).   Brooks Bingharlie Tomie Elko, MD

## 2016-01-24 ENCOUNTER — Ambulatory Visit (INDEPENDENT_AMBULATORY_CARE_PROVIDER_SITE_OTHER): Payer: Medicaid Other | Admitting: Obstetrics and Gynecology

## 2016-01-24 VITALS — BP 107/58 | HR 114

## 2016-01-24 DIAGNOSIS — O24419 Gestational diabetes mellitus in pregnancy, unspecified control: Secondary | ICD-10-CM

## 2016-01-24 DIAGNOSIS — O24913 Unspecified diabetes mellitus in pregnancy, third trimester: Secondary | ICD-10-CM | POA: Diagnosis present

## 2016-01-24 NOTE — Progress Notes (Signed)
NST reviewed and reactive. Occasional contractions noted.

## 2016-01-27 ENCOUNTER — Ambulatory Visit (INDEPENDENT_AMBULATORY_CARE_PROVIDER_SITE_OTHER): Payer: Medicaid Other | Admitting: Obstetrics and Gynecology

## 2016-01-27 VITALS — BP 97/63 | HR 105 | Wt 204.0 lb

## 2016-01-27 DIAGNOSIS — Z3689 Encounter for other specified antenatal screening: Secondary | ICD-10-CM

## 2016-01-27 DIAGNOSIS — O24913 Unspecified diabetes mellitus in pregnancy, third trimester: Secondary | ICD-10-CM | POA: Diagnosis not present

## 2016-01-27 DIAGNOSIS — O09899 Supervision of other high risk pregnancies, unspecified trimester: Secondary | ICD-10-CM

## 2016-01-27 DIAGNOSIS — O9989 Other specified diseases and conditions complicating pregnancy, childbirth and the puerperium: Secondary | ICD-10-CM

## 2016-01-27 DIAGNOSIS — O0993 Supervision of high risk pregnancy, unspecified, third trimester: Secondary | ICD-10-CM

## 2016-01-27 DIAGNOSIS — Z283 Underimmunization status: Secondary | ICD-10-CM | POA: Diagnosis not present

## 2016-01-27 DIAGNOSIS — O24419 Gestational diabetes mellitus in pregnancy, unspecified control: Secondary | ICD-10-CM

## 2016-01-27 NOTE — Progress Notes (Signed)
Subjective:  Sandra Mcknight is a 35 y.o. G3P2002 at 4569w4d being seen today for ongoing prenatal care.  She is currently monitored for the following issues for this high-risk pregnancy and has Headache in pregnancy; Obesity; Supervision of high-risk pregnancy; Rubella non-immune status, antepartum; Gestational diabetes mellitus (GDM) affecting pregnancy, antepartum; and Migraine on her problem list.  Patient reports no complaints.  Contractions: Irregular. Vag. Bleeding: None.  Movement: Present. Denies leaking of fluid.   The following portions of the patient's history were reviewed and updated as appropriate: allergies, current medications, past family history, past medical history, past social history, past surgical history and problem list. Problem list updated.  Objective:   Vitals:   01/27/16 1012  BP: 97/63  Pulse: (!) 105  Weight: 204 lb (92.5 kg)    Fetal Status: Fetal Heart Rate (bpm): NST   Movement: Present     General:  Alert, oriented and cooperative. Patient is in no acute distress.  Skin: Skin is warm and dry. No rash noted.   Cardiovascular: Normal heart rate noted  Respiratory: Normal respiratory effort, no problems with respiration noted  Abdomen: Soft, gravid, appropriate for gestational age. Pain/Pressure: Present     Pelvic:  Cervical exam deferred        Extremities: Normal range of motion.  Edema: None  Mental Status: Normal mood and affect. Normal behavior. Normal judgment and thought content.   Urinalysis:      Assessment and Plan:  Pregnancy: G3P2002 at 5769w4d  1. Supervision of high risk pregnancy in third trimester S/p flu vaccine and Tdap. Considering Depo Provera afterwards for contraception  2. Gestational diabetes mellitus (GDM) affecting pregnancy, antepartum BS are in goal range with exception of two. Since change in Glyburide. Pt congratulated on this.  Will continue with current dosage and antenatal testing. Has growth U/S end of this month  as well. - Amniotic fluid index with NST  3. Rubella non-immune status, antepartum Vaccine PP  Preterm labor symptoms and general obstetric precautions including but not limited to vaginal bleeding, contractions, leaking of fluid and fetal movement were reviewed in detail with the patient. Please refer to After Visit Summary for other counseling recommendations.  Return in about 7 days (around 02/03/2016) for Ob fu and NST/AFI.   Hermina StaggersMichael L Darcia Lampi, MD

## 2016-01-27 NOTE — Progress Notes (Signed)
Pt states she is still having heartburn but had stopped taking the Zantac. Pt advised to resume taking this medication and may take twice daily. US for growth scheduled on 10/26

## 2016-01-31 ENCOUNTER — Other Ambulatory Visit: Payer: Self-pay

## 2016-02-03 ENCOUNTER — Ambulatory Visit (INDEPENDENT_AMBULATORY_CARE_PROVIDER_SITE_OTHER): Payer: Medicaid Other | Admitting: Obstetrics and Gynecology

## 2016-02-03 VITALS — BP 112/64 | HR 101 | Wt 204.0 lb

## 2016-02-03 DIAGNOSIS — O0993 Supervision of high risk pregnancy, unspecified, third trimester: Secondary | ICD-10-CM

## 2016-02-03 DIAGNOSIS — Z3689 Encounter for other specified antenatal screening: Secondary | ICD-10-CM | POA: Diagnosis not present

## 2016-02-03 DIAGNOSIS — O24919 Unspecified diabetes mellitus in pregnancy, unspecified trimester: Secondary | ICD-10-CM

## 2016-02-03 DIAGNOSIS — O24419 Gestational diabetes mellitus in pregnancy, unspecified control: Secondary | ICD-10-CM

## 2016-02-03 MED ORDER — RANITIDINE HCL 150 MG PO CAPS: 150.0000 mg | ORAL_CAPSULE | Freq: Two times a day (BID) | ORAL | 3 refills | Status: DC

## 2016-02-03 NOTE — Progress Notes (Signed)
Prenatal Visit Note Date: 02/03/2016 [facility]  Subjective:  Sandra Mcknight is a 35 y.o. G3P2002 at 2880w4d being seen today for ongoing prenatal care.  She is currently monitored for the following issues for this high-risk pregnancy and has Headache in pregnancy; Obesity; Supervision of high-risk pregnancy; Rubella non-immune status, antepartum; Gestational diabetes mellitus (GDM) affecting pregnancy, antepartum; and Migraine on her problem list.  Patient reports no complaints.   Contractions: Irregular. Vag. Bleeding: None.  Movement: Present. Denies leaking of fluid.   The following portions of the patient's history were reviewed and updated as appropriate: allergies, current medications, past family history, past medical history, past social history, past surgical history and problem list. Problem list updated.  Objective:   Vitals:   02/03/16 0854  BP: 112/64  Pulse: (!) 101  Weight: 204 lb (92.5 kg)    Fetal Status: Fetal Heart Rate (bpm): NST   Movement: Present  Presentation: Vertex  General:  Alert, oriented and cooperative. Patient is in no acute distress.  Skin: Skin is warm and dry. No rash noted.   Cardiovascular: Normal heart rate noted  Respiratory: Normal respiratory effort, no problems with respiration noted  Abdomen: Soft, gravid, appropriate for gestational age. Pain/Pressure: Present     Pelvic:  Cervical exam deferred        Extremities: Normal range of motion.  Edema: None  Mental Status: Normal mood and affect. Normal behavior. Normal judgment and thought content.   Urinalysis:      Assessment and Plan:  Pregnancy: G3P2002 at 2080w4d  1. Supervision of high risk pregnancy in third trimester Routine care. Depo provera  2. Gestational diabetes mellitus (GDM) affecting pregnancy, antepartum Normal BS log on glyburide bidac (2.5 with breakfast and 1.25 with dinner). RNST. Follow up AFI for today. Has repeat growth scheduled  for next week. Continue with 2x/week testing.  - Amniotic fluid index with NST  Preterm labor symptoms and general obstetric precautions including but not limited to vaginal bleeding, contractions, leaking of fluid and fetal movement were reviewed in detail with the patient. Please refer to After Visit Summary for other counseling recommendations.  Return in about 4 days (around 02/07/2016) for NST only.  ROB 10-14d   [place] Bingharlie Zalma Channing, MD

## 2016-02-03 NOTE — Progress Notes (Signed)
Pt reports increased heartburn - takes Tums but they do not work - desires Rx for something else. Next US for growth scheduled 10/26

## 2016-02-07 ENCOUNTER — Ambulatory Visit (INDEPENDENT_AMBULATORY_CARE_PROVIDER_SITE_OTHER): Payer: Medicaid Other | Admitting: *Deleted

## 2016-02-07 VITALS — BP 107/65 | HR 106

## 2016-02-07 DIAGNOSIS — O24913 Unspecified diabetes mellitus in pregnancy, third trimester: Secondary | ICD-10-CM | POA: Diagnosis present

## 2016-02-07 DIAGNOSIS — O24419 Gestational diabetes mellitus in pregnancy, unspecified control: Secondary | ICD-10-CM

## 2016-02-07 NOTE — Progress Notes (Signed)
NST reactive.

## 2016-02-08 ENCOUNTER — Ambulatory Visit (HOSPITAL_COMMUNITY): Payer: Medicaid Other

## 2016-02-10 ENCOUNTER — Ambulatory Visit (INDEPENDENT_AMBULATORY_CARE_PROVIDER_SITE_OTHER): Payer: Medicaid Other | Admitting: Obstetrics and Gynecology

## 2016-02-10 ENCOUNTER — Encounter (HOSPITAL_COMMUNITY): Payer: Self-pay

## 2016-02-10 ENCOUNTER — Other Ambulatory Visit (HOSPITAL_COMMUNITY)
Admission: RE | Admit: 2016-02-10 | Discharge: 2016-02-10 | Disposition: A | Discharge status: Home / Self Care | Payer: Medicaid Other | Source: Ambulatory Visit | Attending: Obstetrics and Gynecology | Admitting: Obstetrics and Gynecology

## 2016-02-10 ENCOUNTER — Ambulatory Visit (HOSPITAL_COMMUNITY)
Admission: RE | Admit: 2016-02-10 | Discharge: 2016-02-10 | Disposition: A | Discharge status: Home / Self Care | Payer: Medicaid Other | Source: Ambulatory Visit | Attending: Family Medicine | Admitting: Family Medicine

## 2016-02-10 VITALS — BP 118/69 | HR 102 | Wt 207.8 lb

## 2016-02-10 DIAGNOSIS — Z283 Underimmunization status: Secondary | ICD-10-CM

## 2016-02-10 DIAGNOSIS — O99213 Obesity complicating pregnancy, third trimester: Secondary | ICD-10-CM | POA: Insufficient documentation

## 2016-02-10 DIAGNOSIS — O9989 Other specified diseases and conditions complicating pregnancy, childbirth and the puerperium: Secondary | ICD-10-CM

## 2016-02-10 DIAGNOSIS — O24415 Gestational diabetes mellitus in pregnancy, controlled by oral hypoglycemic drugs: Secondary | ICD-10-CM | POA: Diagnosis not present

## 2016-02-10 DIAGNOSIS — Z113 Encounter for screening for infections with a predominantly sexual mode of transmission: Secondary | ICD-10-CM | POA: Insufficient documentation

## 2016-02-10 DIAGNOSIS — Z3A35 35 weeks gestation of pregnancy: Secondary | ICD-10-CM | POA: Diagnosis not present

## 2016-02-10 DIAGNOSIS — O0993 Supervision of high risk pregnancy, unspecified, third trimester: Secondary | ICD-10-CM

## 2016-02-10 DIAGNOSIS — O24913 Unspecified diabetes mellitus in pregnancy, third trimester: Secondary | ICD-10-CM | POA: Diagnosis not present

## 2016-02-10 DIAGNOSIS — O24419 Gestational diabetes mellitus in pregnancy, unspecified control: Secondary | ICD-10-CM

## 2016-02-10 DIAGNOSIS — Z2839 Other underimmunization status: Secondary | ICD-10-CM

## 2016-02-10 DIAGNOSIS — O09899 Supervision of other high risk pregnancies, unspecified trimester: Secondary | ICD-10-CM

## 2016-02-10 LAB — POCT URINALYSIS DIP (DEVICE)
Bilirubin Urine: NEGATIVE
GLUCOSE, UA: NEGATIVE mg/dL
Hgb urine dipstick: NEGATIVE
Ketones, ur: NEGATIVE mg/dL
NITRITE: NEGATIVE
PROTEIN: NEGATIVE mg/dL
SPECIFIC GRAVITY, URINE: 1.015 (ref 1.005–1.030)
UROBILINOGEN UA: 2 mg/dL — AB (ref 0.0–1.0)
pH: 7 (ref 5.0–8.0)

## 2016-02-10 LAB — OB RESULTS CONSOLE GBS: GBS: POSITIVE

## 2016-02-10 NOTE — Progress Notes (Signed)
US for growth scheduled today 

## 2016-02-10 NOTE — Progress Notes (Signed)
   PRENATAL VISIT NOTE  Subjective:  Sandra Mcknight is a 35 y.o. G3P2002 at 6134w4d being seen today for ongoing prenatal care.  She is currently monitored for the following issues for this high-risk pregnancy and has Headache in pregnancy; Obesity; Supervision of high-risk pregnancy; Rubella non-immune status, antepartum; Gestational diabetes mellitus (GDM) affecting pregnancy, antepartum; and Migraine on her problem list.  Patient reports no complaints.  Contractions: Irregular. Vag. Bleeding: None.  Movement: Present. Denies leaking of fluid.   The following portions of the patient's history were reviewed and updated as appropriate: allergies, current medications, past family history, past medical history, past social history, past surgical history and problem list. Problem list updated.  Objective:   Vitals:   02/10/16 1352  BP: 118/69  Pulse: (!) 102  Weight: 207 lb 12.8 oz (94.3 kg)    Fetal Status: Fetal Heart Rate (bpm): NST   Movement: Present  Presentation: Vertex  General:  Alert, oriented and cooperative. Patient is in no acute distress.  Skin: Skin is warm and dry. No rash noted.   Cardiovascular: Normal heart rate noted  Respiratory: Normal respiratory effort, no problems with respiration noted  Abdomen: Soft, gravid, appropriate for gestational age. Pain/Pressure: Present     Pelvic:  Cervical exam performed Dilation: 2 Effacement (%): 80 Station: -3  Extremities: Normal range of motion.  Edema: None  Mental Status: Normal mood and affect. Normal behavior. Normal judgment and thought content.   Assessment and Plan:  Pregnancy: G3P2002 at 6234w4d  1. Supervision of high risk pregnancy in third trimester Patient is doing well without complaints Cultures today - GC[facility]Chlamydia probe amp [facility] - Culture, beta strep (group b only)  2. Gestational diabetes mellitus (GDM) affecting pregnancy, antepartum Patient did not bring log book but meter reviewed.  Majority of values within range. 1 value of 193 in the evening noted. Patient admits to consuming a soft drink Continue glyburide 2.5 mg/1.75mg  Follow up growth ultrasound - Fetal nonstress test- NST reviewed and reactive  3. Rubella non-immune status, antepartum Will offer pp  Preterm labor symptoms and general obstetric precautions including but not limited to vaginal bleeding, contractions, leaking of fluid and fetal movement were reviewed in detail with the patient. Please refer to After Visit Summary for other counseling recommendations.  Return in about 4 days (around 02/14/2016) for 2x/wk as scheduled.  Catalina AntiguaPeggy Ulyana Pitones, MD

## 2016-02-11 LAB — CULTURE, BETA STREP (GROUP B ONLY)

## 2016-02-11 LAB — GC/CHLAMYDIA PROBE AMP (~~LOC~~) NOT AT ARMC
Chlamydia: NEGATIVE
Neisseria Gonorrhea: NEGATIVE

## 2016-02-12 ENCOUNTER — Encounter: Payer: Self-pay | Admitting: Obstetrics and Gynecology

## 2016-02-12 DIAGNOSIS — O99820 Streptococcus B carrier state complicating pregnancy: Secondary | ICD-10-CM | POA: Insufficient documentation

## 2016-02-14 ENCOUNTER — Other Ambulatory Visit (HOSPITAL_COMMUNITY): Payer: Self-pay | Admitting: Maternal and Fetal Medicine

## 2016-02-14 ENCOUNTER — Ambulatory Visit (INDEPENDENT_AMBULATORY_CARE_PROVIDER_SITE_OTHER): Payer: Medicaid Other | Admitting: Obstetrics & Gynecology

## 2016-02-14 VITALS — BP 120/71 | HR 97

## 2016-02-14 DIAGNOSIS — O99213 Obesity complicating pregnancy, third trimester: Secondary | ICD-10-CM

## 2016-02-14 DIAGNOSIS — O24913 Unspecified diabetes mellitus in pregnancy, third trimester: Secondary | ICD-10-CM | POA: Diagnosis not present

## 2016-02-14 DIAGNOSIS — Z3A38 38 weeks gestation of pregnancy: Secondary | ICD-10-CM

## 2016-02-14 DIAGNOSIS — O0993 Supervision of high risk pregnancy, unspecified, third trimester: Secondary | ICD-10-CM

## 2016-02-14 DIAGNOSIS — Z3688 Encounter for antenatal screening for fetal macrosomia: Secondary | ICD-10-CM

## 2016-02-14 DIAGNOSIS — O24419 Gestational diabetes mellitus in pregnancy, unspecified control: Secondary | ICD-10-CM

## 2016-02-14 DIAGNOSIS — O24415 Gestational diabetes mellitus in pregnancy, controlled by oral hypoglycemic drugs: Secondary | ICD-10-CM

## 2016-02-14 NOTE — Progress Notes (Signed)
   PRENATAL VISIT NOTE  Subjective:  Sandra Mcknight is a 35 y.o. G3P2002 at 835w1d being seen today for ongoing prenatal care.  She is currently monitored for the following issues for this high-risk pregnancy and has Headache in pregnancy; Obesity; Supervision of high-risk pregnancy; Rubella non-immune status, antepartum; Gestational diabetes mellitus (GDM) affecting pregnancy, antepartum; Migraine; and GBS (group B Streptococcus carrier), +RV culture, currently pregnant on her problem list.  Patient reports headache and discharge for 4 days.   .  .  Movement: Present. Denies leaking of fluid.   The following portions of the patient's history were reviewed and updated as appropriate: allergies, current medications, past family history, past medical history, past social history, past surgical history and problem list. Problem list updated.  Objective:   Vitals:   02/14/16 1102  BP: 120/71  Pulse: 97    Fetal Status:     Movement: Present     General:  Alert, oriented and cooperative. Patient is in no acute distress.  Skin: Skin is warm and dry. No rash noted.   Cardiovascular: Normal heart rate noted  Respiratory: Normal respiratory effort, no problems with respiration noted  Abdomen: Soft, gravid, appropriate for gestational age.       Pelvic:  Cervical exam deferred      speculum showed copious mucus, negative fern  Extremities: Normal range of motion.     Mental Status: Normal mood and affect. Normal behavior. Normal judgment and thought content.   Assessment and Plan:  Pregnancy: G3P2002 at 335w1d  1. Gestational diabetes mellitus (GDM) affecting pregnancy, antepartum NST reactive today - Fetal nonstress test  2. Supervision of high risk pregnancy in third trimester No evidence of ROM  Preterm labor symptoms and general obstetric precautions including but not limited to vaginal bleeding, contractions, leaking of fluid and fetal movement were reviewed in detail with the  patient. Please refer to After Visit Summary for other counseling recommendations.  Return in about 7 days (around 02/21/2016) for NST only;  11/16  Ob fu and NST - has US @ 3pm  .  Adam PhenixJames G Arnold, MD

## 2016-02-14 NOTE — Progress Notes (Signed)
Pt reports having clear fluid leaking since 10/26.

## 2016-02-16 ENCOUNTER — Inpatient Hospital Stay (HOSPITAL_COMMUNITY)
Admission: AD | Admit: 2016-02-16 | Discharge: 2016-02-16 | Disposition: A | Discharge status: Home / Self Care | Payer: Medicaid Other | Source: Ambulatory Visit | Attending: Obstetrics & Gynecology | Admitting: Obstetrics & Gynecology

## 2016-02-16 ENCOUNTER — Encounter (HOSPITAL_COMMUNITY): Payer: Self-pay | Admitting: *Deleted

## 2016-02-16 DIAGNOSIS — Z3689 Encounter for other specified antenatal screening: Secondary | ICD-10-CM

## 2016-02-16 DIAGNOSIS — Z3A36 36 weeks gestation of pregnancy: Secondary | ICD-10-CM | POA: Diagnosis not present

## 2016-02-16 DIAGNOSIS — N898 Other specified noninflammatory disorders of vagina: Secondary | ICD-10-CM | POA: Diagnosis not present

## 2016-02-16 DIAGNOSIS — O24419 Gestational diabetes mellitus in pregnancy, unspecified control: Secondary | ICD-10-CM

## 2016-02-16 DIAGNOSIS — O26893 Other specified pregnancy related conditions, third trimester: Secondary | ICD-10-CM | POA: Insufficient documentation

## 2016-02-16 DIAGNOSIS — Z2839 Other underimmunization status: Secondary | ICD-10-CM

## 2016-02-16 DIAGNOSIS — Z283 Underimmunization status: Secondary | ICD-10-CM

## 2016-02-16 DIAGNOSIS — E860 Dehydration: Secondary | ICD-10-CM | POA: Insufficient documentation

## 2016-02-16 DIAGNOSIS — O9989 Other specified diseases and conditions complicating pregnancy, childbirth and the puerperium: Secondary | ICD-10-CM

## 2016-02-16 DIAGNOSIS — Z87891 Personal history of nicotine dependence: Secondary | ICD-10-CM | POA: Insufficient documentation

## 2016-02-16 DIAGNOSIS — O99820 Streptococcus B carrier state complicating pregnancy: Secondary | ICD-10-CM

## 2016-02-16 DIAGNOSIS — O0993 Supervision of high risk pregnancy, unspecified, third trimester: Secondary | ICD-10-CM

## 2016-02-16 LAB — URINALYSIS, ROUTINE W REFLEX MICROSCOPIC
BILIRUBIN URINE: NEGATIVE
GLUCOSE, UA: NEGATIVE mg/dL
KETONES UR: 40 mg/dL — AB
Nitrite: NEGATIVE
PH: 6.5 (ref 5.0–8.0)
Protein, ur: NEGATIVE mg/dL
Specific Gravity, Urine: 1.025 (ref 1.005–1.030)

## 2016-02-16 LAB — URINE MICROSCOPIC-ADD ON

## 2016-02-16 LAB — WET PREP, GENITAL
CLUE CELLS WET PREP: NONE SEEN
Sperm: NONE SEEN
Trich, Wet Prep: NONE SEEN
Yeast Wet Prep HPF POC: NONE SEEN

## 2016-02-16 MED ORDER — LACTATED RINGERS IV BOLUS (SEPSIS)
1000.0000 mL | Freq: Once | INTRAVENOUS | Status: AC
  Administered 2016-02-16: 1000 mL via INTRAVENOUS

## 2016-02-16 NOTE — Discharge Instructions (Signed)

## 2016-02-16 NOTE — MAU Note (Signed)
Pt presents to MAU with complaints of  An increase in vaginal pressure and vaginal discharge since last night. PT denies any vaginal bleeding.

## 2016-02-16 NOTE — MAU Provider Note (Signed)
History     CSN: [id number]  Arrival date and time: 02/16/16 1725   First Provider Initiated Contact with Patient 02/16/16 1816      Chief Complaint  Patient presents with  . Vaginal Discharge  . vaginal pressure   G3P2002 _0 .3 wks here with c/o vaginal discharge. She describes as thin, yellow, and with malodor since last week. She denies VB, LOF, and ctx. She reports good FM. She denies hx STIs. No new partners.     OB History    Gravida Para Term Preterm AB Living   _1 SAB TAB Ectopic Multiple Live Births           2      Past Medical History:  Diagnosis Date  . Gestational diabetes   . Meningitis   . Obesity (BMI 35.0-39.9 without comorbidity)     Past Surgical History:  Procedure Laterality Date  . NO PAST SURGERIES      Family History  Problem Relation Age of Onset  . Diabetes Mother     Social History  Substance Use Topics  . Smoking status: Former Smoker    Types: Cigarettes    Quit date: 06/23/2015  . Smokeless tobacco: Former Systems developer  . Alcohol use Yes     Comment: socially    Allergies: No Known Allergies  Prescriptions Prior to Admission  Medication Sig Dispense Refill Last Dose  . Blood Glucose Monitoring Suppl (ACCU-CHEK AVIVA PLUS) w/Device KIT 1 Device by Does not apply route once. 1 kit 0 Taking  . butalbital-acetaminophen-caffeine (FIORICET) 50-325-40 MG tablet Take 1 tablet by mouth every 4 (four) hours as needed for headache. 20 tablet 0 Taking  . cyclobenzaprine (FLEXERIL) 10 MG tablet Take 1 tablet (10 mg total) by mouth every 8 (eight) hours as needed for muscle spasms. 30 tablet 1 Taking  . glucose blood (ACCU-CHEK AVIVA PLUS) test strip Use as instructed to test blood sugar 4 times daily. 100 each 12 Taking  . glyBURIDE (DIABETA) 2.5 MG tablet 2.67m PO with morning meal and 1.263mPO with evening meal. 60 tablet 3 Taking  . Lancets (ACCU-CHEK SOFT TOUCH) lancets Use as instructed 100 each 12 Taking  . promethazine  (PHENERGAN) 25 MG tablet Take 1 tablet (25 mg total) by mouth every 6 (six) hours as needed for nausea or vomiting. (Patient not taking: Reported on 02/10/2016) 30 tablet 0 Not Taking  . ranitidine (ZANTAC) 150 MG capsule Take 1 capsule (150 mg total) by mouth 2 (two) times daily. 60 capsule 3 Taking    Review of Systems  Constitutional: Negative.   Gastrointestinal: Negative.    Physical Exam   Blood pressure 128/76, pulse 108, temperature 98.2 F (36.8 C), resp. rate 18, height _2  (1.626 m), weight 96.9 kg (213 lb 9.6 oz), last menstrual period 06/06/2015.  Physical Exam  Constitutional: She is oriented to person, place, and time. She appears well-developed and well-nourished.  HENT:  Head: Normocephalic and atraumatic.  Neck: Normal range of motion. Neck supple.  Respiratory: Effort normal.  GI: Soft. She exhibits no distension. There is no tenderness.  gravid  Genitourinary:  Genitourinary Comments: Speculum: moderate amt thin yellow discharge in vault SVE: 3/80/-2, vtx  Musculoskeletal: Normal range of motion.  Neurological: She is alert and oriented to person, place, and time.  Skin: Skin is warm and dry.  Psychiatric: She has a normal mood and affect.   EFM: 155 bpm, mod variability, + accels,  no decels Toco: irritability Results for orders placed or performed during the hospital encounter of 02/16/16 (from the past 24 hour(s))  Urinalysis, Routine w reflex microscopic (not at [facility])     Status: Abnormal   Collection Time: 02/16/16  5:30 PM  Result Value Ref Range   Color, Urine YELLOW YELLOW   APPearance HAZY (A) CLEAR   Specific Gravity, Urine 1.025 1.005 - 1.030   pH 6.5 5.0 - 8.0   Glucose, UA NEGATIVE NEGATIVE mg/dL   Hgb urine dipstick TRACE (A) NEGATIVE   Bilirubin Urine NEGATIVE NEGATIVE   Ketones, ur 40 (A) NEGATIVE mg/dL   Protein, ur NEGATIVE NEGATIVE mg/dL   Nitrite NEGATIVE NEGATIVE   Leukocytes, UA MODERATE (A) NEGATIVE  Urine microscopic-add on      Status: Abnormal   Collection Time: 02/16/16  5:30 PM  Result Value Ref Range   Squamous Epithelial / LPF 0-5 (A) NONE SEEN   WBC, UA 0-5 0 - 5 WBC/hpf   RBC / HPF 0-5 0 - 5 RBC/hpf   Bacteria, UA FEW (A) NONE SEEN  Wet prep, genital     Status: Abnormal   Collection Time: 02/16/16  6:23 PM  Result Value Ref Range   Yeast Wet Prep HPF POC NONE SEEN NONE SEEN   Trich, Wet Prep NONE SEEN NONE SEEN   Clue Cells Wet Prep HPF POC NONE SEEN NONE SEEN   WBC, Wet Prep HPF POC MODERATE (A) NONE SEEN   Sperm NONE SEEN     MAU Course  Procedures LR 1L bolus Po hydration  MDM Labs ordered and reviewed. No evidence of vaginal infection. Dehydration noted. Normal FHT initially then fetal tachycardia ensued. IV fluid bolus given. FHR returned to nml baseline after IV hydration. Stable for discharge home.  Assessment and Plan  36.[redacted] weeks gestation Dehydration Leukorrhea Reactive NST  Discharge home Follow up as scheduled tomorrow at [place] precautions  Julianne Handler, CNM  02/16/2016 8:06 PM

## 2016-02-16 NOTE — MAU Note (Signed)
C/o increased mucousy discharge for past 6 days with an odor;c/o increased vagina pressure that she rates at 10 since this AM;

## 2016-02-17 ENCOUNTER — Ambulatory Visit (HOSPITAL_COMMUNITY)
Admission: RE | Admit: 2016-02-17 | Discharge: 2016-02-17 | Disposition: A | Discharge status: Home / Self Care | Payer: Medicaid Other | Source: Ambulatory Visit | Attending: Family Medicine | Admitting: Family Medicine

## 2016-02-17 ENCOUNTER — Ambulatory Visit (INDEPENDENT_AMBULATORY_CARE_PROVIDER_SITE_OTHER): Payer: Medicaid Other | Admitting: Family Medicine

## 2016-02-17 VITALS — BP 123/73 | HR 100 | Wt 212.4 lb

## 2016-02-17 DIAGNOSIS — O0993 Supervision of high risk pregnancy, unspecified, third trimester: Secondary | ICD-10-CM

## 2016-02-17 DIAGNOSIS — O99213 Obesity complicating pregnancy, third trimester: Secondary | ICD-10-CM | POA: Diagnosis present

## 2016-02-17 DIAGNOSIS — Z3A36 36 weeks gestation of pregnancy: Secondary | ICD-10-CM | POA: Diagnosis not present

## 2016-02-17 DIAGNOSIS — O24919 Unspecified diabetes mellitus in pregnancy, unspecified trimester: Secondary | ICD-10-CM

## 2016-02-17 DIAGNOSIS — O24415 Gestational diabetes mellitus in pregnancy, controlled by oral hypoglycemic drugs: Secondary | ICD-10-CM | POA: Insufficient documentation

## 2016-02-17 DIAGNOSIS — O24419 Gestational diabetes mellitus in pregnancy, unspecified control: Secondary | ICD-10-CM

## 2016-02-17 LAB — POCT URINALYSIS DIP (DEVICE)
Glucose, UA: NEGATIVE mg/dL
KETONES UR: 40 mg/dL — AB
NITRITE: NEGATIVE
PH: 7 (ref 5.0–8.0)
Protein, ur: 30 mg/dL — AB
Specific Gravity, Urine: 1.025 (ref 1.005–1.030)
Urobilinogen, UA: >=8 mg/dL (ref 0.0–1.0)

## 2016-02-17 LAB — GC/CHLAMYDIA PROBE AMP (~~LOC~~) NOT AT ARMC
CHLAMYDIA, DNA PROBE: NEGATIVE
Neisseria Gonorrhea: NEGATIVE

## 2016-02-17 NOTE — Patient Instructions (Signed)
Gestational Diabetes Mellitus Gestational diabetes mellitus, often simply referred to as gestational diabetes, is a type of diabetes that some women develop during pregnancy. In gestational diabetes, the pancreas does not make enough insulin (a hormone), the cells are less responsive to the insulin that is made (insulin resistance), or both.Normally, insulin moves sugars from food into the tissue cells. The tissue cells use the sugars for energy. The lack of insulin or the lack of normal response to insulin causes excess sugars to build up in the blood instead of going into the tissue cells. As a result, high blood sugar (hyperglycemia) develops. The effect of high sugar (glucose) levels can cause many problems.  RISK FACTORS You have an increased chance of developing gestational diabetes if you have a family history of diabetes and also have one or more of the following risk factors:  A body mass index over 30 (obesity).  A previous pregnancy with gestational diabetes.  An older age at the time of pregnancy. If blood glucose levels are kept in the normal range during pregnancy, women can have a healthy pregnancy. If your blood glucose levels are not well controlled, there may be risks to you, your unborn baby (fetus), your labor and delivery, or your newborn baby.  SYMPTOMS  If symptoms are experienced, they are much like symptoms you would normally expect during pregnancy. The symptoms of gestational diabetes include:   Increased thirst (polydipsia).  Increased urination (polyuria).  Increased urination during the night (nocturia).  Weight loss. This weight loss may be rapid.  Frequent, recurring infections.  Tiredness (fatigue).  Weakness.  Vision changes, such as blurred vision.  Fruity smell to your breath.  Abdominal pain. DIAGNOSIS Diabetes is diagnosed when blood glucose levels are increased. Your blood glucose level may be checked by one or more of the following blood  tests:  A fasting blood glucose test. You will not be allowed to eat for at least 8 hours before a blood sample is taken.  A random blood glucose test. Your blood glucose is checked at any time of the day regardless of when you ate.  An oral glucose tolerance test (OGTT). Your blood glucose is measured after you have not eaten (fasted) for 1-3 hours and then after you drink a glucose-containing beverage. Since the hormones that cause insulin resistance are highest at about 24-28 weeks of a pregnancy, an OGTT is usually performed during that time. If you have risk factors, you may be screened for undiagnosed type 2 diabetes at your first prenatal visit. TREATMENT  Gestational diabetes should be managed first with diet and exercise. Medicines may be added only if they are needed.  You will need to take diabetes medicine or insulin daily to keep blood glucose levels in the desired range.  You will need to match insulin dosing with exercise and healthy food choices. If you have gestational diabetes, your treatment goal is to maintain the following blood glucose levels:  Before meals (preprandial): at or below 95 mg/dL.  After meals (postprandial):  One hour after a meal: at or below 140 mg/dL.  Two hours after a meal: at or below 120 mg/dL. If you have pre-existing type 1 or type 2 diabetes, your treatment goal is to maintain the following blood glucose levels:  Before meals, at bedtime, and overnight: 60-99 mg/dL.  After meals: peak of 100-129 mg/dL. HOME CARE INSTRUCTIONS   Have your hemoglobin A1c level checked twice a year.  Perform daily blood glucose monitoring as directed by   your health care provider. It is common to perform frequent blood glucose monitoring.  Monitor urine ketones when you are ill and as directed by your health care provider.  Take your diabetes medicine and insulin as directed by your health care provider to maintain your blood glucose level in the desired  range.  Never run out of diabetes medicine or insulin. It is needed every day.  Adjust insulin based on your intake of carbohydrates. Carbohydrates can raise blood glucose levels but need to be included in your diet. Carbohydrates provide vitamins, minerals, and fiber which are an essential part of a healthy diet. Carbohydrates are found in fruits, vegetables, whole grains, dairy products, legumes, and foods containing added sugars.  Eat healthy foods. Alternate 3 meals with 3 snacks.  Maintain a healthy weight gain. The usual total expected weight gain varies according to your prepregnancy body mass index (BMI).  Carry a medical alert card or wear your medical alert jewelry.  Carry a 15-gram carbohydrate snack with you at all times to treat low blood glucose (hypoglycemia). Some examples of 15-gram carbohydrate snacks include:  Glucose tablets, 3 or 4.  Glucose gel, 15-gram tube.  Raisins, 2 tablespoons (24 g).  Jelly beans, 6.  Animal crackers, 8.  Fruit juice, regular soda, or low-fat milk, 4 ounces (120 mL).  Gummy treats, 9.  Recognize hypoglycemia. Hypoglycemia during pregnancy occurs with blood glucose levels of 60 mg/dL and below. The risk for hypoglycemia increases when fasting or skipping meals, during or after intense exercise, and during sleep. Hypoglycemia symptoms can include:  Tremors or shakes.  Decreased ability to concentrate.  Sweating.  Increased heart rate.  Headache.  Dry mouth.  Hunger.  Irritability.  Anxiety.  Restless sleep.  Altered speech or coordination.  Confusion.  Treat hypoglycemia promptly. If you are alert and able to safely swallow, follow the 15:15 rule:  Take 15-20 grams of rapid-acting glucose or carbohydrate. Rapid-acting options include glucose gel, glucose tablets, or 4 ounces (120 mL) of fruit juice, regular soda, or low-fat milk.  Check your blood glucose level 15 minutes after taking the glucose.  Take 15-20  grams more of glucose if the repeat blood glucose level is still 70 mg/dL or below.  Eat a meal or snack within 1 hour once blood glucose levels return to normal.  Be alert to polyuria (excess urination) and polydipsia (excess thirst) which are early signs of hyperglycemia. An early awareness of hyperglycemia allows for prompt treatment. Treat hyperglycemia as directed by your health care provider.  Engage in at least 30 minutes of physical activity a day or as directed by your health care provider. Ten minutes of physical activity timed 30 minutes after each meal is encouraged to control postprandial blood glucose levels.  Adjust your insulin dosing and food intake as needed if you start a new exercise or sport.  Follow your sick-day plan at any time you are unable to eat or drink as usual.  Avoid tobacco and alcohol use.  Keep all follow-up visits as directed by your health care provider.  Follow the advice of your health care provider regarding your prenatal and post-delivery (postpartum) appointments, meal planning, exercise, medicines, vitamins, blood tests, other medical tests, and physical activities.  Perform daily skin and foot care. Examine your skin and feet daily for cuts, bruises, redness, nail problems, bleeding, blisters, or sores.  Brush your teeth and gums at least twice a day and floss at least once a day. Follow up with your dentist   regularly.  Schedule an eye exam during the first trimester of your pregnancy or as directed by your health care provider.  Share your diabetes management plan with your workplace or school.  Stay up-to-date with immunizations.  Learn to manage stress.  Obtain ongoing diabetes education and support as needed.  Learn about and consider breastfeeding your baby.  You should have your blood sugar level checked 6-12 weeks after delivery. This is done with an oral glucose tolerance test (OGTT). SEEK MEDICAL CARE IF:   You are unable to  eat food or drink fluids for more than 6 hours.  You have nausea and vomiting for more than 6 hours.  You have a blood glucose level of 200 mg/dL and you have ketones in your urine.  There is a change in mental status.  You develop vision problems.  You have a persistent headache.  You have upper abdominal pain or discomfort.  You develop an additional serious illness.  You have diarrhea for more than 6 hours.  You have been sick or have had a fever for a couple of days and are not getting better. SEEK IMMEDIATE MEDICAL CARE IF:   You have difficulty breathing.  You no longer feel the baby moving.  You are bleeding or have discharge from your vagina.  You start having premature contractions or labor. MAKE SURE YOU:  Understand these instructions.  Will watch your condition.  Will get help right away if you are not doing well or get worse.   This information is not intended to replace advice given to you by your health care provider. Make sure you discuss any questions you have with your health care provider.   Document Released: 07/10/2000 Document Revised: 04/24/2014 Document Reviewed: 10/31/2011 Elsevier Interactive Patient Education 2016 [employer]  Breastfeeding Deciding to breastfeed is one of the best choices you can make for you and your baby. A change in hormones during pregnancy causes your breast tissue to grow and increases the number and size of your milk ducts. These hormones also allow proteins, sugars, and fats from your blood supply to make breast milk in your milk-producing glands. Hormones prevent breast milk from being released before your baby is born as well as prompt milk flow after birth. Once breastfeeding has begun, thoughts of your baby, as well as his or her sucking or crying, can stimulate the release of milk from your milk-producing glands.  BENEFITS OF BREASTFEEDING For Your Baby  Your first milk (colostrum) helps your baby's digestive  system function better.  There are antibodies in your milk that help your baby fight off infections.  Your baby has a lower incidence of asthma, allergies, and sudden infant death syndrome.  The nutrients in breast milk are better for your baby than infant formulas and are designed uniquely for your baby's needs.  Breast milk improves your baby's brain development.  Your baby is less likely to develop other conditions, such as childhood obesity, asthma, or type 2 diabetes mellitus. For You  Breastfeeding helps to create a very special bond between you and your baby.  Breastfeeding is convenient. Breast milk is always available at the correct temperature and costs nothing.  Breastfeeding helps to burn calories and helps you lose the weight gained during pregnancy.  Breastfeeding makes your uterus contract to its prepregnancy size faster and slows bleeding (lochia) after you give birth.   Breastfeeding helps to lower your risk of developing type 2 diabetes mellitus, osteoporosis, and breast or ovarian cancer   later in life. SIGNS THAT YOUR BABY IS HUNGRY Early Signs of Hunger  Increased alertness or activity.  Stretching.  Movement of the head from side to side.  Movement of the head and opening of the mouth when the corner of the mouth or cheek is stroked (rooting).  Increased sucking sounds, smacking lips, cooing, sighing, or squeaking.  Hand-to-mouth movements.  Increased sucking of fingers or hands. Late Signs of Hunger  Fussing.  Intermittent crying. Extreme Signs of Hunger Signs of extreme hunger will require calming and consoling before your baby will be able to breastfeed successfully. Do not wait for the following signs of extreme hunger to occur before you initiate breastfeeding:  Restlessness.  A loud, strong cry.  Screaming. BREASTFEEDING BASICS Breastfeeding Initiation  Find a comfortable place to sit or lie down, with your neck and back well  supported.  Place a pillow or rolled up blanket under your baby to bring him or her to the level of your breast (if you are seated). Nursing pillows are specially designed to help support your arms and your baby while you breastfeed.  Make sure that your baby's abdomen is facing your abdomen.  Gently massage your breast. With your fingertips, massage from your chest wall toward your nipple in a circular motion. This encourages milk flow. You may need to continue this action during the feeding if your milk flows slowly.  Support your breast with 4 fingers underneath and your thumb above your nipple. Make sure your fingers are well away from your nipple and your baby's mouth.  Stroke your baby's lips gently with your finger or nipple.  When your baby's mouth is open wide enough, quickly bring your baby to your breast, placing your entire nipple and as much of the colored area around your nipple (areola) as possible into your baby's mouth.  More areola should be visible above your baby's upper lip than below the lower lip.  Your baby's tongue should be between his or her lower gum and your breast.  Ensure that your baby's mouth is correctly positioned around your nipple (latched). Your baby's lips should create a seal on your breast and be turned out (everted).  It is common for your baby to suck about 2-3 minutes in order to start the flow of breast milk. Latching Teaching your baby how to latch on to your breast properly is very important. An improper latch can cause nipple pain and decreased milk supply for you and poor weight gain in your baby. Also, if your baby is not latched onto your nipple properly, he or she may swallow some air during feeding. This can make your baby fussy. Burping your baby when you switch breasts during the feeding can help to get rid of the air. However, teaching your baby to latch on properly is still the best way to prevent fussiness from swallowing air while  breastfeeding. Signs that your baby has successfully latched on to your nipple:  Silent tugging or silent sucking, without causing you pain.  Swallowing heard between every 3-4 sucks.  Muscle movement above and in front of his or her ears while sucking. Signs that your baby has not successfully latched on to nipple:  Sucking sounds or smacking sounds from your baby while breastfeeding.  Nipple pain. If you think your baby has not latched on correctly, slip your finger into the corner of your baby's mouth to break the suction and place it between your baby's gums. Attempt breastfeeding initiation again. Signs   of Successful Breastfeeding Signs from your baby:  A gradual decrease in the number of sucks or complete cessation of sucking.  Falling asleep.  Relaxation of his or her body.  Retention of a small amount of milk in his or her mouth.  Letting go of your breast by himself or herself. Signs from you:  Breasts that have increased in firmness, weight, and size 1-3 hours after feeding.  Breasts that are softer immediately after breastfeeding.  Increased milk volume, as well as a change in milk consistency and color by the fifth day of breastfeeding.  Nipples that are not sore, cracked, or bleeding. Signs That Your Baby is Getting Enough Milk  Wetting at least 3 diapers in a 24-hour period. The urine should be clear and pale yellow by age 5 days.  At least 3 stools in a 24-hour period by age 5 days. The stool should be soft and yellow.  At least 3 stools in a 24-hour period by age 7 days. The stool should be seedy and yellow.  No loss of weight greater than 10% of birth weight during the first 3 days of age.  Average weight gain of 4-7 ounces (113-198 g) per week after age 4 days.  Consistent daily weight gain by age 5 days, without weight loss after the age of 2 weeks. After a feeding, your baby may spit up a small amount. This is common. BREASTFEEDING FREQUENCY AND  DURATION Frequent feeding will help you make more milk and can prevent sore nipples and breast engorgement. Breastfeed when you feel the need to reduce the fullness of your breasts or when your baby shows signs of hunger. This is called "breastfeeding on demand." Avoid introducing a pacifier to your baby while you are working to establish breastfeeding (the first 4-6 weeks after your baby is born). After this time you may choose to use a pacifier. Research has shown that pacifier use during the first year of a baby's life decreases the risk of sudden infant death syndrome (SIDS). Allow your baby to feed on each breast as long as he or she wants. Breastfeed until your baby is finished feeding. When your baby unlatches or falls asleep while feeding from the first breast, offer the second breast. Because newborns are often sleepy in the first few weeks of life, you may need to awaken your baby to get him or her to feed. Breastfeeding times will vary from baby to baby. However, the following rules can serve as a guide to help you ensure that your baby is properly fed:  Newborns (babies 4 weeks of age or younger) may breastfeed every 1-3 hours.  Newborns should not go longer than 3 hours during the day or 5 hours during the night without breastfeeding.  You should breastfeed your baby a minimum of 8 times in a 24-hour period until you begin to introduce solid foods to your baby at around 6 months of age. BREAST MILK PUMPING Pumping and storing breast milk allows you to ensure that your baby is exclusively fed your breast milk, even at times when you are unable to breastfeed. This is especially important if you are going back to work while you are still breastfeeding or when you are not able to be present during feedings. Your lactation consultant can give you guidelines on how long it is safe to store breast milk. A breast pump is a machine that allows you to pump milk from your breast into a sterile bottle.  The pumped   breast milk can then be stored in a refrigerator or freezer. Some breast pumps are operated by hand, while others use electricity. Ask your lactation consultant which type will work best for you. Breast pumps can be purchased, but some hospitals and breastfeeding support groups lease breast pumps on a monthly basis. A lactation consultant can teach you how to hand express breast milk, if you prefer not to use a pump. CARING FOR YOUR BREASTS WHILE YOU BREASTFEED Nipples can become dry, cracked, and sore while breastfeeding. The following recommendations can help keep your breasts moisturized and healthy:  Avoid using soap on your nipples.  Wear a supportive bra. Although not required, special nursing bras and tank tops are designed to allow access to your breasts for breastfeeding without taking off your entire bra or top. Avoid wearing underwire-style bras or extremely tight bras.  Air dry your nipples for 3-4minutes after each feeding.  Use only cotton bra pads to absorb leaked breast milk. Leaking of breast milk between feedings is normal.  Use lanolin on your nipples after breastfeeding. Lanolin helps to maintain your skin's normal moisture barrier. If you use pure lanolin, you do not need to wash it off before feeding your baby again. Pure lanolin is not toxic to your baby. You may also hand express a few drops of breast milk and gently massage that milk into your nipples and allow the milk to air dry. In the first few weeks after giving birth, some women experience extremely full breasts (engorgement). Engorgement can make your breasts feel heavy, warm, and tender to the touch. Engorgement peaks within 3-5 days after you give birth. The following recommendations can help ease engorgement:  Completely empty your breasts while breastfeeding or pumping. You may want to start by applying warm, moist heat (in the shower or with warm water-soaked hand towels) just before feeding or pumping.  This increases circulation and helps the milk flow. If your baby does not completely empty your breasts while breastfeeding, pump any extra milk after he or she is finished.  Wear a snug bra (nursing or regular) or tank top for 1-2 days to signal your body to slightly decrease milk production.  Apply ice packs to your breasts, unless this is too uncomfortable for you.  Make sure that your baby is latched on and positioned properly while breastfeeding. If engorgement persists after 48 hours of following these recommendations, contact your health care provider or a lactation consultant. OVERALL HEALTH CARE RECOMMENDATIONS WHILE BREASTFEEDING  Eat healthy foods. Alternate between meals and snacks, eating 3 of each per day. Because what you eat affects your breast milk, some of the foods may make your baby more irritable than usual. Avoid eating these foods if you are sure that they are negatively affecting your baby.  Drink milk, fruit juice, and water to satisfy your thirst (about 10 glasses a day).  Rest often, relax, and continue to take your prenatal vitamins to prevent fatigue, stress, and anemia.  Continue breast self-awareness checks.  Avoid chewing and smoking tobacco. Chemicals from cigarettes that pass into breast milk and exposure to secondhand smoke may harm your baby.  Avoid alcohol and drug use, including marijuana. Some medicines that may be harmful to your baby can pass through breast milk. It is important to ask your health care provider before taking any medicine, including all over-the-counter and prescription medicine as well as vitamin and herbal supplements. It is possible to become pregnant while breastfeeding. If birth control is desired, ask   your health care provider about options that will be safe for your baby. SEEK MEDICAL CARE IF:  You feel like you want to stop breastfeeding or have become frustrated with breastfeeding.  You have painful breasts or  nipples.  Your nipples are cracked or bleeding.  Your breasts are red, tender, or warm.  You have a swollen area on either breast.  You have a fever or chills.  You have nausea or vomiting.  You have drainage other than breast milk from your nipples.  Your breasts do not become full before feedings by the fifth day after you give birth.  You feel sad and depressed.  Your baby is too sleepy to eat well.  Your baby is having trouble sleeping.   Your baby is wetting less than 3 diapers in a 24-hour period.  Your baby has less than 3 stools in a 24-hour period.  Your baby's skin or the white part of his or her eyes becomes yellow.   Your baby is not gaining weight by 5 days of age. SEEK IMMEDIATE MEDICAL CARE IF:  Your baby is overly tired (lethargic) and does not want to wake up and feed.  Your baby develops an unexplained fever.   This information is not intended to replace advice given to you by your health care provider. Make sure you discuss any questions you have with your health care provider.   Document Released: 04/03/2005 Document Revised: 12/23/2014 Document Reviewed: 09/25/2012 Elsevier Interactive Patient Education 2016 [employer]  

## 2016-02-17 NOTE — Progress Notes (Signed)
   PRENATAL VISIT NOTE  Subjective:  Sandra Mcknight is a 35 y.o. G3P2002 at 1172w4d being seen today for ongoing prenatal care.  She is currently monitored for the following issues for this high-risk pregnancy and has Headache in pregnancy; Obesity; Supervision of high-risk pregnancy; Rubella non-immune status, antepartum; Gestational diabetes mellitus (GDM) affecting pregnancy, antepartum; Migraine; and GBS (group B Streptococcus carrier), +RV culture, currently pregnant on her problem list.  Patient reports no complaints.  Contractions: Irregular. Vag. Bleeding: None.  Movement: Present. Denies leaking of fluid.   The following portions of the patient's history were reviewed and updated as appropriate: allergies, current medications, past family history, past medical history, past social history, past surgical history and problem list. Problem list updated.  Objective:   Vitals:   02/17/16 0906  BP: 123/73  Pulse: 100  Weight: 212 lb 6.4 oz (96.3 kg)    Fetal Status: Fetal Heart Rate (bpm): NST   Movement: Present  Presentation: Vertex  General:  Alert, oriented and cooperative. Patient is in no acute distress.  Skin: Skin is warm and dry. No rash noted.   Cardiovascular: Normal heart rate noted  Respiratory: Normal respiratory effort, no problems with respiration noted  Abdomen: Soft, gravid, appropriate for gestational age. Pain/Pressure: Present     Pelvic:  Cervical exam deferred        Extremities: Normal range of motion.  Edema: Trace  Mental Status: Normal mood and affect. Normal behavior. Normal judgment and thought content.  NST reviewed and reactive. For AFI in u/s today Assessment and Plan:  Pregnancy: G3P2002 at 4472w4d  1. Gestational diabetes mellitus (GDM) affecting pregnancy, antepartum Brings no book today nor meter. States BS are in control. Risks of poor control reviewed. Scheduled for u/s for growth at 38 wks. - US MFM OB LIMITED; Future - Fetal nonstress  test  2. Supervision of high risk pregnancy in third trimester GBS positive--reviewed with pt. - US MFM OB LIMITED; Future - Fetal nonstress test  Term labor symptoms and general obstetric precautions including but not limited to vaginal bleeding, contractions, leaking of fluid and fetal movement were reviewed in detail with the patient. Please refer to After Visit Summary for other counseling recommendations.  Return in 1 week (on 02/24/2016) [facility] and NST, NST only in 3-4 days.  Reva Boresanya S Kennedi Lizardo, MD

## 2016-02-17 NOTE — Progress Notes (Signed)
States all of family sick, she has been having nausea.

## 2016-02-19 ENCOUNTER — Inpatient Hospital Stay (HOSPITAL_COMMUNITY)
Admission: AD | Admit: 2016-02-19 | Discharge: 2016-02-21 | DRG: 775 | Disposition: A | Discharge status: Home / Self Care | Payer: Medicaid Other | Source: Ambulatory Visit | Attending: Obstetrics and Gynecology | Admitting: Obstetrics and Gynecology

## 2016-02-19 ENCOUNTER — Encounter (HOSPITAL_COMMUNITY): Payer: Self-pay | Admitting: *Deleted

## 2016-02-19 DIAGNOSIS — O99824 Streptococcus B carrier state complicating childbirth: Secondary | ICD-10-CM | POA: Diagnosis present

## 2016-02-19 DIAGNOSIS — O99820 Streptococcus B carrier state complicating pregnancy: Secondary | ICD-10-CM

## 2016-02-19 DIAGNOSIS — O24425 Gestational diabetes mellitus in childbirth, controlled by oral hypoglycemic drugs: Secondary | ICD-10-CM | POA: Diagnosis present

## 2016-02-19 DIAGNOSIS — Z87891 Personal history of nicotine dependence: Secondary | ICD-10-CM

## 2016-02-19 DIAGNOSIS — O9989 Other specified diseases and conditions complicating pregnancy, childbirth and the puerperium: Secondary | ICD-10-CM

## 2016-02-19 DIAGNOSIS — Z3A36 36 weeks gestation of pregnancy: Secondary | ICD-10-CM

## 2016-02-19 DIAGNOSIS — Z283 Underimmunization status: Secondary | ICD-10-CM

## 2016-02-19 DIAGNOSIS — O24424 Gestational diabetes mellitus in childbirth, insulin controlled: Secondary | ICD-10-CM | POA: Diagnosis not present

## 2016-02-19 DIAGNOSIS — Z833 Family history of diabetes mellitus: Secondary | ICD-10-CM | POA: Diagnosis not present

## 2016-02-19 DIAGNOSIS — O42013 Preterm premature rupture of membranes, onset of labor within 24 hours of rupture, third trimester: Secondary | ICD-10-CM | POA: Diagnosis not present

## 2016-02-19 DIAGNOSIS — O09899 Supervision of other high risk pregnancies, unspecified trimester: Secondary | ICD-10-CM

## 2016-02-19 DIAGNOSIS — O42913 Preterm premature rupture of membranes, unspecified as to length of time between rupture and onset of labor, third trimester: Secondary | ICD-10-CM | POA: Diagnosis present

## 2016-02-19 DIAGNOSIS — O24419 Gestational diabetes mellitus in pregnancy, unspecified control: Secondary | ICD-10-CM

## 2016-02-19 DIAGNOSIS — O4290 Premature rupture of membranes, unspecified as to length of time between rupture and onset of labor, unspecified weeks of gestation: Secondary | ICD-10-CM | POA: Diagnosis present

## 2016-02-19 LAB — TYPE AND SCREEN
ABO/RH(D): A POS
Antibody Screen: NEGATIVE

## 2016-02-19 LAB — URINALYSIS, ROUTINE W REFLEX MICROSCOPIC
Glucose, UA: NEGATIVE mg/dL
NITRITE: NEGATIVE
PROTEIN: NEGATIVE mg/dL
Specific Gravity, Urine: 1.02 (ref 1.005–1.030)
pH: 6.5 (ref 5.0–8.0)

## 2016-02-19 LAB — WET PREP, GENITAL
CLUE CELLS WET PREP: NONE SEEN
SPERM: NONE SEEN
Trich, Wet Prep: NONE SEEN

## 2016-02-19 LAB — ABO/RH: ABO/RH(D): A POS

## 2016-02-19 LAB — CBC
HCT: 24.8 % — ABNORMAL LOW (ref 36.0–46.0)
HEMOGLOBIN: 8.1 g/dL — AB (ref 12.0–15.0)
MCH: 24.6 pg — AB (ref 26.0–34.0)
MCHC: 32.7 g/dL (ref 30.0–36.0)
MCV: 75.4 fL — AB (ref 78.0–100.0)
PLATELETS: 303 10*3/uL (ref 150–400)
RBC: 3.29 MIL/uL — AB (ref 3.87–5.11)
RDW: 16 % — ABNORMAL HIGH (ref 11.5–15.5)
WBC: 8.8 10*3/uL (ref 4.0–10.5)

## 2016-02-19 LAB — URINE MICROSCOPIC-ADD ON

## 2016-02-19 LAB — POCT FERN TEST
POCT FERN TEST: NEGATIVE
POCT FERN TEST: POSITIVE

## 2016-02-19 LAB — GLUCOSE, CAPILLARY
Glucose-Capillary: 92 mg/dL (ref 65–99)
Glucose-Capillary: 119 mg/dL — ABNORMAL HIGH (ref 65–99)

## 2016-02-19 MED ORDER — FLEET ENEMA 7-19 GM/118ML RE ENEM: 1.0000 | ENEMA | RECTAL | Status: DC | PRN

## 2016-02-19 MED ORDER — ONDANSETRON HCL 4 MG/2ML IJ SOLN: 4.0000 mg | Freq: Four times a day (QID) | INTRAMUSCULAR | Status: DC | PRN

## 2016-02-19 MED ORDER — LACTATED RINGERS IV SOLN
INTRAVENOUS | Status: DC
  Administered 2016-02-19 (×2): via INTRAVENOUS

## 2016-02-19 MED ORDER — BETAMETHASONE SOD PHOS & ACET 6 (3-3) MG/ML IJ SUSP
12.0000 mg | INTRAMUSCULAR | Status: DC
  Administered 2016-02-19: 12 mg via INTRAMUSCULAR
  Filled 2016-02-19: qty 2

## 2016-02-19 MED ORDER — PENICILLIN G POTASSIUM 5000000 UNITS IJ SOLR
2.5000 10*6.[IU] | INTRAMUSCULAR | Status: DC
  Filled 2016-02-19 (×5): qty 2.5

## 2016-02-19 MED ORDER — FENTANYL CITRATE (PF) 100 MCG/2ML IJ SOLN
100.0000 ug | INTRAMUSCULAR | Status: DC | PRN
  Administered 2016-02-20: 100 ug via INTRAVENOUS
  Filled 2016-02-19: qty 2

## 2016-02-19 MED ORDER — LIDOCAINE HCL (PF) 1 % IJ SOLN
30.0000 mL | INTRAMUSCULAR | Status: DC | PRN
  Administered 2016-02-20: 30 mL via SUBCUTANEOUS
  Filled 2016-02-19: qty 30

## 2016-02-19 MED ORDER — DEXTROSE 5 % IV SOLN
5.0000 10*6.[IU] | Freq: Once | INTRAVENOUS | Status: AC
  Administered 2016-02-19: 5 10*6.[IU] via INTRAVENOUS
  Filled 2016-02-19: qty 5

## 2016-02-19 MED ORDER — SOD CITRATE-CITRIC ACID 500-334 MG/5ML PO SOLN: 30.0000 mL | ORAL | Status: DC | PRN

## 2016-02-19 MED ORDER — LACTATED RINGERS IV SOLN: 500.0000 mL | INTRAVENOUS | Status: DC | PRN

## 2016-02-19 MED ORDER — OXYTOCIN BOLUS FROM INFUSION: 500.0000 mL | Freq: Once | INTRAVENOUS | Status: DC

## 2016-02-19 MED ORDER — TERBUTALINE SULFATE 1 MG/ML IJ SOLN
0.2500 mg | Freq: Once | INTRAMUSCULAR | Status: DC | PRN
  Filled 2016-02-19: qty 1

## 2016-02-19 MED ORDER — OXYTOCIN 40 UNITS IN LACTATED RINGERS INFUSION - SIMPLE MED
1.0000 m[IU]/min | INTRAVENOUS | Status: DC
  Administered 2016-02-19: 2 m[IU]/min via INTRAVENOUS
  Administered 2016-02-20: 666 m[IU]/min via INTRAVENOUS
  Filled 2016-02-19: qty 1000

## 2016-02-19 MED ORDER — OXYTOCIN 40 UNITS IN LACTATED RINGERS INFUSION - SIMPLE MED
2.5000 [IU]/h | INTRAVENOUS | Status: DC
  Filled 2016-02-19: qty 1000

## 2016-02-19 MED ORDER — ACETAMINOPHEN 325 MG PO TABS: 650.0000 mg | ORAL_TABLET | ORAL | Status: DC | PRN

## 2016-02-19 NOTE — Progress Notes (Signed)
Report to Ruthe MannanHeather, Charge RN birthing suites.  Patient to be admitted to room 166.

## 2016-02-19 NOTE — Anesthesia Pain Management Evaluation Note (Signed)
  CRNA Pain Management Visit Note  Patient: Sandra Mcknight, 35 y.o., female  "Hello I am a [facility] of the anesthesia [facility]. We have an anesthesia team available at all times to provide care throughout the hospital, including epidural management and anesthesia for C-section. I don't know your plan for the delivery whether it a natural birth, water birth, IV sedation, nitrous supplementation, doula or epidural, but we want to meet your pain goals."   1.Was your pain managed to your expectations on prior hospitalizations?   Yes   2.What is your expectation for pain management during this hospitalization?     Epidural  3.How can we help you reach that goal? epidural  Record the patient's initial score and the patient's pain goal.   Pain: 10  Pain Goal: 8 [facility] wants you to be able to say your pain was always managed very well.  Kaliel Bolds 02/19/2016

## 2016-02-19 NOTE — H&P (Signed)
LABOR AND DELIVERY ADMISSION HISTORY AND PHYSICAL NOTE  Sandra Mcknight is a 35 y.o. female 573P2002 with IUP at 4572w6d by LMP c/w 2nd trim US presenting for PROM.   Patient came in to MAU due to vaginal discharge, pinkish tinged. She has been having liquidy discharge for about 1 week, but when she was assessed, was told that it was just normal vaginal discharge. Today, she noticed a more pinkish-bloody tinge to the discharge so came in to be evaluated. In MAU, speculum exam showed pinkish-brown thicker discharge, and fern test was positive.   She reports positive fetal movement. She denies overt bright red vaginal bleeding. Denies contractions.   Past Medical History: Past Medical History:  Diagnosis Date  . Gestational diabetes   . Meningitis   . Obesity (BMI 35.0-39.9 without comorbidity)     Past Surgical History: Past Surgical History:  Procedure Laterality Date  . NO PAST SURGERIES      Obstetrical History: OB History    Gravida Para Term Preterm AB Living   3 2 2     2    SAB TAB Ectopic Multiple Live Births           2      Social History: Social History   Social History  . Marital status: Single    Spouse name: N/A  . Number of children: N/A  . Years of education: N/A   Social History Main Topics  . Smoking status: Former Smoker    Types: Cigarettes    Quit date: 06/23/2015  . Smokeless tobacco: Former NeurosurgeonUser  . Alcohol use Yes     Comment: socially  . Drug use: No  . Sexual activity: Yes    Birth control/ protection: None   Other Topics Concern  . None   Social History Narrative  . None    Family History: Family History  Problem Relation Age of Onset  . Diabetes Mother     Allergies: No Known Allergies  Prescriptions Prior to Admission  Medication Sig Dispense Refill Last Dose  . glyBURIDE (DIABETA) 1.25 MG tablet Take 1.25 mg by mouth daily with supper.   02/18/2016 at Unknown time  . glyBURIDE (DIABETA) 2.5 MG tablet Take 2.5 mg by mouth  daily with breakfast.   02/18/2016 at Unknown time  . ranitidine (ZANTAC) 150 MG tablet Take 150 mg by mouth 2 (two) times daily as needed for heartburn.   02/18/2016 at Unknown time     Review of Systems   All systems reviewed and negative except as stated in HPI  Blood pressure 130/72, pulse 112, temperature 98.2 F (36.8 C), temperature source Oral, resp. rate 18, weight 215 lb 12 oz (97.9 kg), last menstrual period 06/06/2015, SpO2 97 %. General appearance: alert, cooperative, appears stated age and no distress Lungs: clear to auscultation bilaterally Heart: regular rate and rhythm Abdomen: soft, non-tender; bowel sounds normal Extremities: No calf swelling or tenderness Presentation: cephalic Fetal monitoring: 140, mod var, +accels, no decels Uterine activity: Flat Dilation: 2 Effacement (%): 80 Station: -3 Exam by:: Dr. Omer JackMumaw   Prenatal labs: ABO, Rh: A/POS/-- (05/31 0001) Antibody: NEG (05/31 0001) Rubella: !Error! Equivocal/non-immune RPR: NON REAC (05/31 0001)  HBsAg: NEGATIVE (05/31 0001)  HIV: NONREACTIVE (05/31 0001)  GBS:   POSITIVE 1 hr Glucola: 163; 3 hr Glucola - 83/202/181/178 Genetic screening:  Neg Anatomy US: Normal  Prenatal Transfer Tool  Maternal Diabetes: Yes:  Diabetes Type:  Insulin/Medication controlled Genetic Screening: Normal Maternal Ultrasounds/Referrals: Normal Fetal  Ultrasounds or other Referrals:  None Maternal Substance Abuse:  No Significant Maternal Medications:  Meds include: Other:  Glyburide Significant Maternal Lab Results: Lab values include: Group B Strep positive, Other: Rubella equivocal  Results for orders placed or performed during the hospital encounter of 02/19/16 (from the past 24 hour(s))  Urinalysis, Routine w reflex microscopic (not at [facility])   Collection Time: 02/19/16  1:30 PM  Result Value Ref Range   Color, Urine YELLOW YELLOW   APPearance CLEAR CLEAR   Specific Gravity, Urine 1.020 1.005 - 1.030   pH 6.5 5.0  - 8.0   Glucose, UA NEGATIVE NEGATIVE mg/dL   Hgb urine dipstick MODERATE (A) NEGATIVE   Bilirubin Urine SMALL (A) NEGATIVE   Ketones, ur >80 (A) NEGATIVE mg/dL   Protein, ur NEGATIVE NEGATIVE mg/dL   Nitrite NEGATIVE NEGATIVE   Leukocytes, UA LARGE (A) NEGATIVE  Urine microscopic-add on   Collection Time: 02/19/16  1:30 PM  Result Value Ref Range   Squamous Epithelial / LPF 6-30 (A) NONE SEEN   WBC, UA TOO NUMEROUS TO COUNT 0 - 5 WBC/hpf   RBC / HPF 0-5 0 - 5 RBC/hpf   Bacteria, UA FEW (A) NONE SEEN   Urine-Other MUCOUS PRESENT   Wet prep, genital   Collection Time: 02/19/16  2:15 PM  Result Value Ref Range   Yeast Wet Prep HPF POC PRESENT (A) NONE SEEN   Trich, Wet Prep NONE SEEN NONE SEEN   Clue Cells Wet Prep HPF POC NONE SEEN NONE SEEN   WBC, Wet Prep HPF POC MANY (A) NONE SEEN   Sperm NONE SEEN   Fern Test   Collection Time: 02/19/16  2:19 PM  Result Value Ref Range   POCT Fern Test Negative = intact amniotic membranes   Fern Test   Collection Time: 02/19/16  2:26 PM  Result Value Ref Range   POCT Fern Test Positive = ruptured amniotic membanes     Patient Active Problem List   Diagnosis Date Noted  . GBS (group B Streptococcus carrier), +RV culture, currently pregnant 02/12/2016  . Migraine 10/04/2015  . Gestational diabetes mellitus (GDM) affecting pregnancy, antepartum 09/23/2015  . Rubella non-immune status, antepartum 09/19/2015  . Supervision of high-risk pregnancy 09/15/2015  . Headache in pregnancy 03/31/2011  . Obesity 03/31/2011    Assessment: Sandra CoasterLolita L Linderman is a 35 y.o. G3P2002 at 771w6d here for PROM.  #Labor: PROM, augment with pitocin #Pain:  Epidural on request, fentanyl as needed #FWB:  Cat I #ID:   GBS + #MOF:  Bottle #MOC: Depo #Circ:  Outpatient #GDMA2: CBG q4h, insulin drip if CBG >120  Jen MowElizabeth Mumaw, DO OB Fellow Center for [facility] 02/19/2016, 2:44 PM

## 2016-02-19 NOTE — MAU Note (Signed)
Patient presents to MAU with concerns about pink tinged mucous when she used the bathroom today.  Also states having back pain that started last night.  States the back pain is intermittent and irregular, but painful.  Reports feeling great fetal movement.  Hx GDM diet controlled.

## 2016-02-19 NOTE — Progress Notes (Signed)
OB Note Bedside u/s shows cephalic presentation.  Pt feels occasional UCs but nothing consistent Will wait for 2nd dose of PCN to go in before starting pitocin PRN for augmentation, which would be around 2100 today. Pt amenable to plan.   Sandra Mcknight, Jr MD Attending Center for [facility] Midwife(Faculty Practice)

## 2016-02-19 NOTE — Progress Notes (Signed)
S:  Patient seen for evaluation of labor progress.  She is laying comfortably in bed.  She is waiting on 2nd dose of PCN before starting pit prn for labor progression.  O:   Today's Vitals   02/19/16 1731 02/19/16 1732 02/19/16 1936 02/19/16 1937  BP:  121/72  130/75  Pulse:  (!) 106  (!) 112  Resp: 16  18   Temp: 98.5 F (36.9 C)  98.1 F (36.7 C)   TempSrc: Oral  Oral   SpO2:      Weight:      Height:      PainSc:       FHR:  Baseline 155 with moderate variability.  Accelerations present.  No decelerations TOCO:  Irregular contractions  A/P:   Continue with expectant management. Finish 2nd dose of PCN then Pit PRN Plans to receive an epidural

## 2016-02-19 NOTE — Progress Notes (Signed)
Lorenz CoasterLolita L Balke is a 35 y.o. G3P2002 at 5669w6d.  Subjective: Moderate discomfort w/ UC's.   Objective: BP 130/75   Pulse (!) 112   Temp 98.1 F (36.7 C) (Oral)   Resp 18   Ht 5\' 2"  (1.575 m)   Wt 214 lb (97.1 kg)   LMP 06/06/2015 (Exact Date)   SpO2 97%   BMI 39.14 kg/m    FHT:  FHR: 155 bpm, variability: mod,  accelerations:  15x15,  decelerations:  none UC:   irreg, moderate Dilation: 4.5 Effacement (%): 70 Station: -3 Presentation: Vertex Exam by:: Lj Miyamoto,CNM  Labs: Results for orders placed or performed during the hospital encounter of 02/19/16 (from the past 24 hour(s))  Urinalysis, Routine w reflex microscopic (not [facility])     Status: Abnormal   Collection Time: 02/19/16  1:30 PM  Result Value Ref Range   Color, Urine YELLOW YELLOW   APPearance CLEAR CLEAR   Specific Gravity, Urine 1.020 1.005 - 1.030   pH 6.5 5.0 - 8.0   Glucose, UA NEGATIVE NEGATIVE mg/dL   Hgb urine dipstick MODERATE (A) NEGATIVE   Bilirubin Urine SMALL (A) NEGATIVE   Ketones, ur >80 (A) NEGATIVE mg/dL   Protein, ur NEGATIVE NEGATIVE mg/dL   Nitrite NEGATIVE NEGATIVE   Leukocytes, UA LARGE (A) NEGATIVE  Urine microscopic-add on     Status: Abnormal   Collection Time: 02/19/16  1:30 PM  Result Value Ref Range   Squamous Epithelial / LPF 6-30 (A) NONE SEEN   WBC, UA TOO NUMEROUS TO COUNT 0 - 5 WBC/hpf   RBC / HPF 0-5 0 - 5 RBC/hpf   Bacteria, UA FEW (A) NONE SEEN   Urine-Other MUCOUS PRESENT   Wet prep, genital     Status: Abnormal   Collection Time: 02/19/16  2:15 PM  Result Value Ref Range   Yeast Wet Prep HPF POC PRESENT (A) NONE SEEN   Trich, Wet Prep NONE SEEN NONE SEEN   Clue Cells Wet Prep HPF POC NONE SEEN NONE SEEN   WBC, Wet Prep HPF POC MANY (A) NONE SEEN   Sperm NONE SEEN   Fern Test     Status: None   Collection Time: 02/19/16  2:19 PM  Result Value Ref Range   POCT Fern Test Negative = intact amniotic membranes   Fern Test     Status: None   Collection  Time: 02/19/16  2:26 PM  Result Value Ref Range   POCT Fern Test Positive = ruptured amniotic membanes   CBC     Status: Abnormal   Collection Time: 02/19/16  3:45 PM  Result Value Ref Range   WBC 8.8 4.0 - 10.5 K/uL   RBC 3.29 (L) 3.87 - 5.11 MIL/uL   Hemoglobin 8.1 (L) 12.0 - 15.0 g/dL   HCT 16.124.8 (L) 09.636.0 - 04.546.0 %   MCV 75.4 (L) 78.0 - 100.0 fL   MCH 24.6 (L) 26.0 - 34.0 pg   MCHC 32.7 30.0 - 36.0 g/dL   RDW 40.916.0 (H) 81.111.5 - 91.415.5 %   Platelets 303 150 - 400 K/uL  Type and screen [facility]     Status: None   Collection Time: 02/19/16  3:45 PM  Result Value Ref Range   ABO/RH(D) A POS    Antibody Screen NEG    Sample Expiration 02/22/2016   ABO/Rh     Status: None   Collection Time: 02/19/16  3:45 PM  Result Value Ref Range  ABO/RH(D) A POS   Glucose, capillary     Status: None   Collection Time: 02/19/16  4:39 PM  Result Value Ref Range   Glucose-Capillary 92 65 - 99 mg/dL  Glucose, capillary     Status: Abnormal   Collection Time: 02/19/16  8:52 PM  Result Value Ref Range   Glucose-Capillary 119 (H) 65 - 99 mg/dL    Assessment / Plan: 2665w6d week IUP Labor: Early Fetal Wellbeing:  Category I Pain Control:  None Anticipated MOD:  SVD Watch labor curve closely  Dorathy KinsmanVirginia Carletha Dawn, CNM 02/19/2016 10:13 PM

## 2016-02-20 ENCOUNTER — Encounter (HOSPITAL_COMMUNITY): Payer: Self-pay

## 2016-02-20 DIAGNOSIS — O24424 Gestational diabetes mellitus in childbirth, insulin controlled: Secondary | ICD-10-CM

## 2016-02-20 DIAGNOSIS — O99824 Streptococcus B carrier state complicating childbirth: Secondary | ICD-10-CM

## 2016-02-20 DIAGNOSIS — O42013 Preterm premature rupture of membranes, onset of labor within 24 hours of rupture, third trimester: Secondary | ICD-10-CM

## 2016-02-20 DIAGNOSIS — Z3A36 36 weeks gestation of pregnancy: Secondary | ICD-10-CM

## 2016-02-20 LAB — RPR: RPR Ser Ql: NONREACTIVE

## 2016-02-20 LAB — CBC
HEMATOCRIT: 24.7 % — AB (ref 36.0–46.0)
Hemoglobin: 8 g/dL — ABNORMAL LOW (ref 12.0–15.0)
MCH: 24.3 pg — ABNORMAL LOW (ref 26.0–34.0)
MCHC: 32.4 g/dL (ref 30.0–36.0)
MCV: 75.1 fL — AB (ref 78.0–100.0)
Platelets: 294 10*3/uL (ref 150–400)
RBC: 3.29 MIL/uL — AB (ref 3.87–5.11)
RDW: 16.2 % — AB (ref 11.5–15.5)
WBC: 14.5 10*3/uL — AB (ref 4.0–10.5)

## 2016-02-20 LAB — HIV ANTIBODY (ROUTINE TESTING W REFLEX): HIV Screen 4th Generation wRfx: NONREACTIVE

## 2016-02-20 MED ORDER — SIMETHICONE 80 MG PO CHEW
80.0000 mg | CHEWABLE_TABLET | ORAL | Status: DC | PRN
  Administered 2016-02-20 – 2016-02-21 (×2): 80 mg via ORAL
  Filled 2016-02-20 (×3): qty 1

## 2016-02-20 MED ORDER — TETANUS-DIPHTH-ACELL PERTUSSIS 5-2.5-18.5 LF-MCG/0.5 IM SUSP: 0.5000 mL | Freq: Once | INTRAMUSCULAR | Status: DC

## 2016-02-20 MED ORDER — SENNOSIDES-DOCUSATE SODIUM 8.6-50 MG PO TABS
2.0000 | ORAL_TABLET | ORAL | Status: DC
  Administered 2016-02-20: 2 via ORAL
  Filled 2016-02-20: qty 2

## 2016-02-20 MED ORDER — ONDANSETRON HCL 4 MG/2ML IJ SOLN: 4.0000 mg | INTRAMUSCULAR | Status: DC | PRN

## 2016-02-20 MED ORDER — IBUPROFEN 600 MG PO TABS
600.0000 mg | ORAL_TABLET | Freq: Four times a day (QID) | ORAL | Status: DC
  Administered 2016-02-20 – 2016-02-21 (×6): 600 mg via ORAL
  Filled 2016-02-20 (×6): qty 1

## 2016-02-20 MED ORDER — ONDANSETRON HCL 4 MG PO TABS: 4.0000 mg | ORAL_TABLET | ORAL | Status: DC | PRN

## 2016-02-20 MED ORDER — DIPHENHYDRAMINE HCL 25 MG PO CAPS: 25.0000 mg | ORAL_CAPSULE | Freq: Four times a day (QID) | ORAL | Status: DC | PRN

## 2016-02-20 MED ORDER — ACETAMINOPHEN 325 MG PO TABS
650.0000 mg | ORAL_TABLET | ORAL | Status: DC | PRN
  Administered 2016-02-20: 650 mg via ORAL
  Filled 2016-02-20: qty 2

## 2016-02-20 MED ORDER — FAMOTIDINE 20 MG PO TABS
20.0000 mg | ORAL_TABLET | Freq: Two times a day (BID) | ORAL | Status: DC
  Administered 2016-02-20 – 2016-02-21 (×3): 20 mg via ORAL
  Filled 2016-02-20 (×3): qty 1

## 2016-02-20 MED ORDER — BENZOCAINE-MENTHOL 20-0.5 % EX AERO
1.0000 "application " | INHALATION_SPRAY | CUTANEOUS | Status: DC | PRN
  Administered 2016-02-20: 1 via TOPICAL
  Filled 2016-02-20: qty 56

## 2016-02-20 MED ORDER — COCONUT OIL OIL: 1.0000 "application " | TOPICAL_OIL | Status: DC | PRN

## 2016-02-20 MED ORDER — ZOLPIDEM TARTRATE 5 MG PO TABS: 5.0000 mg | ORAL_TABLET | Freq: Every evening | ORAL | Status: DC | PRN

## 2016-02-20 MED ORDER — WITCH HAZEL-GLYCERIN EX PADS: 1.0000 "application " | MEDICATED_PAD | CUTANEOUS | Status: DC | PRN

## 2016-02-20 MED ORDER — DIBUCAINE 1 % RE OINT: 1.0000 "application " | TOPICAL_OINTMENT | RECTAL | Status: DC | PRN

## 2016-02-20 MED ORDER — PRENATAL MULTIVITAMIN CH
1.0000 | ORAL_TABLET | Freq: Every day | ORAL | Status: DC
  Administered 2016-02-20 – 2016-02-21 (×2): 1 via ORAL
  Filled 2016-02-20 (×2): qty 1

## 2016-02-20 NOTE — Progress Notes (Signed)
During Daylight Savings  

## 2016-02-20 NOTE — Progress Notes (Signed)
During [employer]

## 2016-02-21 ENCOUNTER — Other Ambulatory Visit: Payer: Self-pay

## 2016-02-21 LAB — GLUCOSE, CAPILLARY: GLUCOSE-CAPILLARY: 87 mg/dL (ref 65–99)

## 2016-02-21 MED ORDER — SENNOSIDES-DOCUSATE SODIUM 8.6-50 MG PO TABS: 2.0000 | ORAL_TABLET | Freq: Every day | ORAL | 0 refills | Status: DC

## 2016-02-21 MED ORDER — IBUPROFEN 600 MG PO TABS: 600.0000 mg | ORAL_TABLET | Freq: Four times a day (QID) | ORAL | 0 refills | Status: DC

## 2016-02-21 NOTE — Progress Notes (Signed)
Discharge education complete. Discharge instructions and follow up appointment discussed. Patient verbalized understanding. 

## 2016-02-21 NOTE — Progress Notes (Signed)
UR chart review completed.  

## 2016-02-21 NOTE — Discharge Summary (Signed)
OB Discharge Summary     Patient Name: Sandra Mcknight DOB: 10/13/1980 MRN: [id number]  Date of admission: 02/19/2016 Delivering MD: Youlanda MightySANTOS, JOSUE D   Date of discharge: 02/21/2016  Admitting diagnosis: 36WKS, SPOTTED BLOOD IN MUCUS Intrauterine pregnancy: 5762w0d     Secondary diagnosis:  Active Problems:   PROM (premature rupture of membranes)  Additional problems: gestational diabetes A2     Discharge diagnosis: Term Pregnancy Delivered and GDM A2                                                                                                Post partum procedures:none  Augmentation: Pitocin  Complications: None  Hospital course:  Onset of Labor With Vaginal Delivery     35 y.o. yo G3P3003 at 2762w0d was admitted in Active Labor on 02/19/2016. Patient had an uncomplicated labor course as follows:  Membrane Rupture Time/Date: 2:26 PM ,02/20/2016   Intrapartum Procedures: Episiotomy:                                           Lacerations:  2nd degree [3];Perineal [11]  Patient had a delivery of a Viable infant. 02/20/2016  Information for the patient's newborn:  Sandra Mcknight, Boy [place] [[id number]][[id number]]  Delivery Method: Vaginal, Spontaneous Delivery (Filed from Delivery Summary)    Pateint had an uncomplicated postpartum course.  She is ambulating, tolerating a regular diet, passing flatus, and urinating well. Patient is discharged home in stable condition on 02/21/16.    Physical exam Vitals:   02/20/16 0725 02/20/16 1515 02/20/16 1825 02/21/16 0530  BP: 133/71 128/70 129/75 114/63  Pulse: (!) 111 (!) 109 90 85  Resp: 18 20 18 18   Temp:  98.5 F (36.9 C) 98.6 F (37 C) 97.9 F (36.6 C)  TempSrc:  Oral Axillary Oral  SpO2: 100% 100%    Weight:      Height:       General: alert, cooperative and no distress Lochia: appropriate Uterine Fundus: firm Incision: N/A DVT Evaluation: No evidence of DVT seen on physical exam. Labs: Lab Results  Component Value Date   WBC 14.5  (H) 02/20/2016   HGB 8.0 (L) 02/20/2016   HCT 24.7 (L) 02/20/2016   MCV 75.1 (L) 02/20/2016   PLT 294 02/20/2016   CMP Latest Ref Rng & Units 11/07/2015  Glucose 65 - 99 mg/dL 98  BUN 6 - 20 mg/dL 6  Creatinine 7.820.44 - 9.561.00 mg/dL 2.130.47  Sodium 086135 - 578145 mmol/L 137  Potassium 3.5 - 5.1 mmol/L 3.5  Chloride 101 - 111 mmol/L 107  CO2 22 - 32 mmol/L 21(L)  Calcium 8.9 - 10.3 mg/dL 9.0  Total Protein 6.5 - 8.1 g/dL 7.0  Total Bilirubin 0.3 - 1.2 mg/dL 0.5  Alkaline Phos 38 - 126 U/L 51  AST 15 - 41 U/L 22  ALT 14 - 54 U/L 20    Discharge instruction: per After Visit Summary and "Baby and Me Booklet".  After visit meds:  Medication List    STOP taking these medications   glyBURIDE 1.25 MG tablet Commonly known as:  DIABETA   glyBURIDE 2.5 MG tablet Commonly known as:  DIABETA     TAKE these medications   ibuprofen 600 MG tablet Commonly known as:  ADVIL,MOTRIN Take 1 tablet (600 mg total) by mouth every 6 (six) hours.   ranitidine 150 MG tablet Commonly known as:  ZANTAC Take 150 mg by mouth 2 (two) times daily as needed for heartburn.   senna-docusate 8.6-50 MG tablet Commonly known as:  Senokot-S Take 2 tablets by mouth at bedtime.       Diet: routine diet  Activity: Advance as tolerated. Pelvic rest for 6 weeks.   Outpatient follow up:6 weeks Follow up Appt:Future Appointments Date Time Provider Department Center  03/02/2016 3:00 PM [facility] US 3 [facility]US [facility]-US  03/28/2016 1:20 PM Aviva SignsMarie L Biller, CNM [facility]  03/31/2016 9:00 AM Bertram DenverKaren E Teague Clark, PA-C [facility]   Follow up Visit:No Follow-up on file.  Postpartum contraception: Depo Provera  Newborn Data: Live born female  Birth Weight: 7 lb 11.6 oz (3505 g) APGAR: 8, 9  Baby Feeding: Bottle Disposition:home with mother   02/21/2016 Tillman SersAngela C Riccio, DO  OB FELLOW DISCHARGE ATTESTATION  I have seen and examined this patient and agree with above documentation in the  resident's note.   Ernestina PennaNicholas Schenk, MD 11:40 AM

## 2016-02-21 NOTE — Discharge Instructions (Signed)

## 2016-02-21 NOTE — Plan of Care (Signed)
Problem: Role Relationship: Goal: Ability to demonstrate positive interaction with newborn will improve Encouraged to wear bra and to use ice packs when milk comes in for discomfort.

## 2016-02-24 ENCOUNTER — Other Ambulatory Visit: Payer: Self-pay | Admitting: Obstetrics and Gynecology

## 2016-02-25 ENCOUNTER — Telehealth: Payer: Self-pay

## 2016-02-25 NOTE — Telephone Encounter (Signed)
LM that her FMLA paperwork is completed.  She go the front office a request her copy.  If she has any questions to please give us a call.

## 2016-03-02 ENCOUNTER — Ambulatory Visit (HOSPITAL_COMMUNITY): Payer: Medicaid Other

## 2016-03-02 ENCOUNTER — Encounter (HOSPITAL_COMMUNITY): Payer: Self-pay

## 2016-03-07 ENCOUNTER — Encounter: Payer: Self-pay | Admitting: Physician Assistant

## 2016-03-28 ENCOUNTER — Encounter: Payer: Self-pay | Admitting: Advanced Practice Midwife

## 2016-03-28 ENCOUNTER — Ambulatory Visit (INDEPENDENT_AMBULATORY_CARE_PROVIDER_SITE_OTHER): Payer: Medicaid Other | Admitting: Advanced Practice Midwife

## 2016-03-28 NOTE — Patient Instructions (Signed)

## 2016-03-28 NOTE — Progress Notes (Signed)
Subjective:     Sandra Mcknight is a 35 y.o. female who presents for a postpartum visit. She is 5 weeks postpartum following a spontaneous vaginal delivery. I have fully reviewed the prenatal and intrapartum course. The delivery was at 5991w6d gestational weeks. Outcome: spontaneous vaginal delivery. Anesthesia: local. Postpartum course has been uneventful except for occasional shooting pain in vagina. Baby's course has been uneventful. Baby is feeding by bottle - Similac Advance. Bleeding red. Bowel function is normal. Bladder function is normal. Patient is not sexually active. Contraception method is condoms. Postpartum depression screening: negative.  The following portions of the patient's history were reviewed and updated as appropriate: allergies, current medications, past family history, past medical history, past social history, past surgical history and problem list.  Review of Systems Pertinent items are noted in HPI.   Objective:    Wt 186 lb 11.2 oz (84.7 kg)   LMP 03/27/2016   Breastfeeding? No   BMI 34.15 kg/m   General:  alert, cooperative and no distress   Breasts:  inspection negative, no nipple discharge or bleeding, no masses or nodularity palpable  Lungs: clear to auscultation bilaterally  Heart:  regular rate and rhythm, S1, S2 normal, no murmur, click, rub or gallop  Abdomen: soft, non-tender; bowel sounds normal; no masses,  no organomegaly   Vulva:  healing well  Vagina: normal vagina  Cervix:  not examined  Corpus: not examined  Adnexa:  not evaluated  Rectal Exam: Not performed.        Assessment:     normal  postpartum exam. Pap smear not done at today's visit.   Plan:    1. Contraception: abstinence 2. Reiterated need for contraception if she chooses to become active again 3. Follow up in: 1 year  or as needed.

## 2016-03-29 ENCOUNTER — Telehealth: Payer: Self-pay | Admitting: *Deleted

## 2016-03-29 NOTE — Telephone Encounter (Signed)
Per Hilda LiasMarie , needs to be scheduled for 2 hour postpartum GTT.  I called and left a message we are calling to discuss scheduling an appointment- please call our office.

## 2016-03-31 ENCOUNTER — Encounter: Payer: Self-pay | Admitting: *Deleted

## 2016-03-31 ENCOUNTER — Ambulatory Visit (INDEPENDENT_AMBULATORY_CARE_PROVIDER_SITE_OTHER): Payer: Medicaid Other | Admitting: Physician Assistant

## 2016-03-31 VITALS — BP 153/72 | HR 75 | Ht 62.0 in | Wt 185.0 lb

## 2016-03-31 DIAGNOSIS — M62838 Other muscle spasm: Secondary | ICD-10-CM | POA: Diagnosis not present

## 2016-03-31 DIAGNOSIS — G43001 Migraine without aura, not intractable, with status migrainosus: Secondary | ICD-10-CM | POA: Diagnosis not present

## 2016-03-31 MED ORDER — CYCLOBENZAPRINE HCL 10 MG PO TABS: 10.0000 mg | ORAL_TABLET | Freq: Three times a day (TID) | ORAL | 1 refills | Status: DC | PRN

## 2016-03-31 MED ORDER — TOPIRAMATE 25 MG PO TABS
75.0000 mg | ORAL_TABLET | Freq: Every day | ORAL | 1 refills | Status: DC
Start: 2016-03-31 — End: 2017-06-03

## 2016-03-31 NOTE — Progress Notes (Signed)
History:  Sandra Mcknight is a 35 y.o. [phone] who presents to clinic today for headache eval.  She had her baby and has been having more headaches.  She lost her pills while in the hospital and has not had anything to take.  She has been using ibuprofen which helps some.   Her blood pressure is usually normal but not good today. She has been having headaches nearly daily since delivery.  They have been severe and really never go away.   HAs are worse during her period which just started today.     HIT6:74 Number of days in the last 4 weeks with:  Severe headache: 21 Moderate headache: 7 Mild headache: 0  No headache: 0   Past Medical History:  Diagnosis Date  . Gestational diabetes   . Meningitis   . Obesity (BMI 35.0-39.9 without comorbidity)     Social History   Social History  . Marital status: Single    Spouse name: N/A  . Number of children: N/A  . Years of education: N/A   Occupational History  . Not on file.   Social History Main Topics  . Smoking status: Former Smoker    Types: Cigarettes    Quit date: 06/23/2015  . Smokeless tobacco: Former NeurosurgeonUser  . Alcohol use Yes     Comment: socially  . Drug use: No  . Sexual activity: Yes    Birth control/ protection: None   Other Topics Concern  . Not on file   Social History Narrative  . No narrative on file    Family History  Problem Relation Age of Onset  . Diabetes Mother     No Known Allergies  Current Outpatient Prescriptions on File Prior to Visit  Medication Sig Dispense Refill  . ibuprofen (ADVIL,MOTRIN) 600 MG tablet Take 1 tablet (600 mg total) by mouth every 6 (six) hours. 30 tablet 0  . ranitidine (ZANTAC) 150 MG tablet Take 150 mg by mouth 2 (two) times daily as needed for heartburn.    . senna-docusate (SENOKOT-S) 8.6-50 MG tablet Take 2 tablets by mouth at bedtime. 30 tablet 0   No current facility-administered medications on file prior to visit.      Review of Systems:  All pertinent  positive/negative included in HPI, all other review of systems are negative   Objective:  Physical Exam BP (!) 153/72   Pulse 75   Ht 5\' 2"  (1.575 m)   Wt 185 lb (83.9 kg)   LMP 03/27/2016   Breastfeeding? No   BMI 33.84 kg/m  CONSTITUTIONAL: Well-developed, well-nourished female in no acute distress.  EYES: EOM intact ENT: Normocephalic CARDIOVASCULAR: Regular rate  RESPIRATORY: Normal rate.  MUSCULOSKELETAL: Normal ROM, strength equal bilaterally,  muscle spasm noted SKIN: Warm, dry without erythema  NEUROLOGICAL: Alert, oriented, CN II-XII grossly intact, Appropriate balance PSYCH: Normal behavior, mood  PROCEDURE:  TRIGGER POINT INJECTIONS  Procedure: Mixture of 1%  Lidocaine, marcaine and dexamethazone in a ratio of 2:2:1  injected with 1cc each site at right greater Occipital Nerve, right lesser occipital nerve, right cervical paraspinal muscles, right trapezius.  Total amount injected: 10cc.  Pt tolerated procedure well.   Assessment & Plan:  Assessment: 1. Muscle spasm   2. Migraine without aura and with status migrainosus, not intractable      Plan: Begin Topamax with 25mg  and increase weekly to 75mg   Pt advised she MUST NOT become pregnant on this medication.  If she is considering sex, she must  use contraception.  She plans to talk about [facility] soon.   ONB and TPI today.  Pt indicated improvement right away Flexeril for acute pain  See PCP regarding BP Follow-up in 2 months or sooner PRN  Bertram DenverKaren E Teague Clark, PA-C 03/31/2016 9:30 AM

## 2016-03-31 NOTE — Patient Instructions (Signed)
Migraine Headache A migraine headache is an intense, throbbing pain on one side or both sides of the head. Migraines may also cause other symptoms, such as nausea, vomiting, and sensitivity to light and noise. What are the causes? Doing or taking certain things may also trigger migraines, such as:  Alcohol.  Smoking.  Medicines, such as: ? Medicine used to treat chest pain (nitroglycerine). ? Birth control pills. ? Estrogen pills. ? Certain blood pressure medicines.  Aged cheeses, chocolate, or caffeine.  Foods or drinks that contain nitrates, glutamate, aspartame, or tyramine.  Physical activity.  Other things that may trigger a migraine include:  Menstruation.  Pregnancy.  Hunger.  Stress, lack of sleep, too much sleep, or fatigue.  Weather changes.  What increases the risk? The following factors may make you more likely to experience migraine headaches:  Age. Risk increases with age.  Family history of migraine headaches.  Being Caucasian.  Depression and anxiety.  Obesity.  Being a woman.  Having a hole in the heart (patent foramen ovale) or other heart problems.  What are the signs or symptoms? The main symptom of this condition is pulsating or throbbing pain. Pain may:  Happen in any area of the head, such as on one side or both sides.  Interfere with daily activities.  Get worse with physical activity.  Get worse with exposure to bright lights or loud noises.  Other symptoms may include:  Nausea.  Vomiting.  Dizziness.  General sensitivity to bright lights, loud noises, or smells.  Before you get a migraine, you may get warning signs that a migraine is developing (aura). An aura may include:  Seeing flashing lights or having blind spots.  Seeing bright spots, halos, or zigzag lines.  Having tunnel vision or blurred vision.  Having numbness or a tingling feeling.  Having trouble talking.  Having muscle weakness.  How is this  diagnosed? A migraine headache can be diagnosed based on:  Your symptoms.  A physical exam.  Tests, such as CT scan or MRI of the head. These imaging tests can help rule out other causes of headaches.  Taking fluid from the spine (lumbar puncture) and analyzing it (cerebrospinal fluid analysis, or CSF analysis).  How is this treated? A migraine headache is usually treated with medicines that:  Relieve pain.  Relieve nausea.  Prevent migraines from coming back.  Treatment may also include:  Acupuncture.  Lifestyle changes like avoiding foods that trigger migraines.  Follow these instructions at home: Medicines  Take over-the-counter and prescription medicines only as told by your health care provider.  Do not drive or use heavy machinery while taking prescription pain medicine.  To prevent or treat constipation while you are taking prescription pain medicine, your health care provider may recommend that you: ? Drink enough fluid to keep your urine clear or pale yellow. ? Take over-the-counter or prescription medicines. ? Eat foods that are high in fiber, such as fresh fruits and vegetables, whole grains, and beans. ? Limit foods that are high in fat and processed sugars, such as fried and sweet foods. Lifestyle  Avoid alcohol use.  Do not use any products that contain nicotine or tobacco, such as cigarettes and e-cigarettes. If you need help quitting, ask your health care provider.  Get at least 8 hours of sleep every night.  Limit your stress. General instructions   Keep a journal to find out what may trigger your migraine headaches. For example, write down: ? What you eat and   drink. ? How much sleep you get. ? Any change to your diet or medicines.  If you have a migraine: ? Avoid things that make your symptoms worse, such as bright lights. ? It may help to lie down in a dark, quiet room. ? Do not drive or use heavy machinery. ? Ask your health care provider  what activities are safe for you while you are experiencing symptoms.  Keep all follow-up visits as told by your health care provider. This is important. Contact a health care provider if:  You develop symptoms that are different or more severe than your usual migraine symptoms. Get help right away if:  Your migraine becomes severe.  You have a fever.  You have a stiff neck.  You have vision loss.  Your muscles feel weak or like you cannot control them.  You start to lose your balance often.  You develop trouble walking.  You faint. This information is not intended to replace advice given to you by your health care provider. Make sure you discuss any questions you have with your health care provider. Document Released: 04/03/2005 Document Revised: 10/22/2015 Document Reviewed: 09/20/2015 Elsevier Interactive Patient Education  2017 [employer] Inc[employer]   

## 2016-03-31 NOTE — Telephone Encounter (Signed)
I called Claudia again and left a message we need to schedule an appointment- please call our office.

## 2016-03-31 NOTE — Telephone Encounter (Signed)
Will send letter.

## 2016-04-04 ENCOUNTER — Other Ambulatory Visit: Payer: Self-pay

## 2016-06-02 ENCOUNTER — Encounter: Payer: Self-pay | Admitting: Physician Assistant

## 2016-11-16 ENCOUNTER — Emergency Department (HOSPITAL_COMMUNITY)
Admission: EM | Admit: 2016-11-16 | Discharge: 2016-11-16 | Disposition: A | Discharge status: Home / Self Care | Payer: Medicaid Other | Attending: Emergency Medicine | Admitting: Emergency Medicine

## 2016-11-16 ENCOUNTER — Encounter (HOSPITAL_COMMUNITY): Payer: Self-pay | Admitting: Emergency Medicine

## 2016-11-16 DIAGNOSIS — R519 Headache, unspecified: Secondary | ICD-10-CM

## 2016-11-16 DIAGNOSIS — R51 Headache: Secondary | ICD-10-CM | POA: Insufficient documentation

## 2016-11-16 DIAGNOSIS — F17210 Nicotine dependence, cigarettes, uncomplicated: Secondary | ICD-10-CM | POA: Insufficient documentation

## 2016-11-16 MED ORDER — DIPHENHYDRAMINE HCL 50 MG/ML IJ SOLN
25.0000 mg | Freq: Once | INTRAMUSCULAR | Status: AC
  Administered 2016-11-16: 25 mg via INTRAVENOUS
  Filled 2016-11-16: qty 1

## 2016-11-16 MED ORDER — PROCHLORPERAZINE EDISYLATE 5 MG/ML IJ SOLN
10.0000 mg | Freq: Once | INTRAMUSCULAR | Status: AC
  Administered 2016-11-16: 10 mg via INTRAVENOUS
  Filled 2016-11-16: qty 2

## 2016-11-16 MED ORDER — DEXAMETHASONE SODIUM PHOSPHATE 10 MG/ML IJ SOLN
10.0000 mg | Freq: Once | INTRAMUSCULAR | Status: AC
  Administered 2016-11-16: 10 mg via INTRAVENOUS
  Filled 2016-11-16: qty 1

## 2016-11-16 MED ORDER — KETOROLAC TROMETHAMINE 15 MG/ML IJ SOLN
15.0000 mg | Freq: Once | INTRAMUSCULAR | Status: AC
  Administered 2016-11-16: 15 mg via INTRAVENOUS
  Filled 2016-11-16: qty 1

## 2016-11-16 NOTE — ED Triage Notes (Addendum)
Patient c/o frontal headache that has been constant since Tuesday. Patient states that pain is behind her eyes now causing her to be dizzy. Patient denies any visual problems.  Patient reports history of migraines but currently doesn't have any medications for them.  Patient also adds generalized body aches that are worse with movement.

## 2016-11-16 NOTE — ED Provider Notes (Signed)
[facility]-EMERGENCY DEPT Provider Note   CSN: [id number] Arrival date & time: 11/16/16  1538     History   Chief Complaint Chief Complaint  Patient presents with  . Headache  . Generalized Body Aches    HPI Sandra Mcknight is a 36 y.o. female.  The history is provided by the patient.  Migraine  This is a recurrent problem. The current episode started 2 days ago. The problem has not changed since onset.Pertinent negatives include no chest pain, no abdominal pain and no shortness of breath. Exacerbated by: movement, bending, light. She has tried acetaminophen for the symptoms. The treatment provided no relief.   Not been on topamax or flexeril (for migraines) due to expense.   Past Medical History:  Diagnosis Date  . Gestational diabetes   . Meningitis   . Obesity (BMI 35.0-39.9 without comorbidity)     Patient Active Problem List   Diagnosis Date Noted  . Muscle spasm 03/31/2016  . PROM (premature rupture of membranes) 02/19/2016  . GBS (group B Streptococcus carrier), +RV culture, currently pregnant 02/12/2016  . Migraine 10/04/2015  . Gestational diabetes mellitus (GDM) affecting pregnancy, antepartum 09/23/2015  . Rubella non-immune status, antepartum 09/19/2015  . Obesity 03/31/2011    Past Surgical History:  Procedure Laterality Date  . NO PAST SURGERIES      OB History    Gravida Para Term Preterm AB Living   3 3 3     3    SAB TAB Ectopic Multiple Live Births         0 3       Home Medications    Prior to Admission medications   Medication Sig Start Date End Date Taking? Authorizing Provider  acetaminophen (TYLENOL) 500 MG tablet Take 1,000 mg by mouth every 6 (six) hours as needed for moderate pain or headache.   Yes [provider]  cyclobenzaprine (FLEXERIL) 10 MG tablet Take 1 tablet (10 mg total) by mouth every 8 (eight) hours as needed for muscle spasms. Patient not taking: Reported on 11/16/2016 03/31/16   Glyn Ade, Scot Jun, PA-C    ibuprofen (ADVIL,MOTRIN) 600 MG tablet Take 1 tablet (600 mg total) by mouth every 6 (six) hours. Patient not taking: Reported on 11/16/2016 02/21/16   Tillman Sers, DO  senna-docusate (SENOKOT-S) 8.6-50 MG tablet Take 2 tablets by mouth at bedtime. Patient not taking: Reported on 11/16/2016 02/21/16   Tillman Sers, DO  topiramate (TOPAMAX) 25 MG tablet Take 3 tablets (75 mg total) by mouth daily. Begin with 25mg  and increase weekly to 75mg  Patient not taking: Reported on 11/16/2016 03/31/16   Glyn Ade, Scot Jun, PA-C    Family History Family History  Problem Relation Age of Onset  . Diabetes Mother     Social History Social History  Substance Use Topics  . Smoking status: Light Tobacco Smoker    Types: Cigarettes    Last attempt to quit: 06/23/2015  . Smokeless tobacco: Former Neurosurgeon  . Alcohol use Yes     Comment: socially     Allergies   Patient has no known allergies.   Review of Systems Review of Systems  Respiratory: Negative for shortness of breath.   Cardiovascular: Negative for chest pain.  Gastrointestinal: Negative for abdominal pain.  All other systems are reviewed and are negative for acute change except as noted in the HPI    Physical Exam Updated Vital Signs BP 107/61 (BP Location: Right Arm)   Pulse 87  Temp 98.6 F (37 C) (Oral)   Resp 12   Ht 5\' 2"  (1.575 m)   Wt 97.5 kg (215 lb)   LMP 11/15/2016   SpO2 98%   BMI 39.32 kg/m   Physical Exam  Constitutional: She is oriented to person, place, and time. She appears well-developed and well-nourished. No distress.  HENT:  Head: Normocephalic and atraumatic.  Right Ear: External ear normal.  Left Ear: External ear normal.  Nose: Nose normal.  Eyes: Conjunctivae and EOM are normal. No scleral icterus.  Fundoscopic exam:      The right eye shows no hemorrhage and no papilledema.       The left eye shows no hemorrhage and no papilledema.  Neck: Normal range of motion and phonation normal.   Cardiovascular: Normal rate and regular rhythm.   Pulmonary/Chest: Effort normal. No stridor. No respiratory distress.  Abdominal: She exhibits no distension.  Musculoskeletal: Normal range of motion. She exhibits no edema.  Neurological: She is alert and oriented to person, place, and time.  Mental Status: Alert and oriented to person, place, and time. Attention and concentration normal. Speech clear. Recent memory is intact  Cranial Nerves  II Visual Fields: Intact to confrontation. Visual fields intact. III, IV, VI: Pupils equal and reactive to light and near. Full eye movement without nystagmus  V Facial Sensation: Normal. No weakness of masticatory muscles  VII: No facial weakness or asymmetry  VIII Auditory Acuity: Grossly normal  IX/X: The uvula is midline; the palate elevates symmetrically  XI: Normal sternocleidomastoid and trapezius strength  XII: The tongue is midline. No atrophy or fasciculations.   Motor System: Muscle Strength: 5/5 and symmetric in the upper and lower extremities. No pronation or drift.  Muscle Tone: Tone and muscle bulk are normal in the upper and lower extremities.   Reflexes: DTRs: 1+ and symmetrical in all four extremities. Plantar responses are flexor bilaterally.  Coordination: Intact finger-to-nose, heel-to-shin. No tremor.  Sensation: Intact to light touch, and pinprick.  Gait: Routine gait normal.    Skin: She is not diaphoretic.  Psychiatric: She has a normal mood and affect. Her behavior is normal.  Vitals reviewed.    ED Treatments / Results  Labs (all labs ordered are listed, but only abnormal results are displayed) Labs Reviewed - No data to display  EKG  EKG Interpretation None       Radiology No results found.  Procedures Procedures (including critical care time)  Medications Ordered in ED Medications  diphenhydrAMINE (BENADRYL) injection 25 mg (25 mg Intravenous Given 11/16/16 1716)  dexamethasone (DECADRON) injection  10 mg (10 mg Intravenous Given 11/16/16 1717)  prochlorperazine (COMPAZINE) injection 10 mg (10 mg Intravenous Given 11/16/16 1717)  ketorolac (TORADOL) 15 MG/ML injection 15 mg (15 mg Intravenous Given 11/16/16 1716)     Initial Impression / Assessment and Plan / ED Course  I have reviewed the triage vital signs and the nursing notes.  Pertinent labs & imaging results that were available during my care of the patient were reviewed by me and considered in my medical decision making (see chart for details).     Typical migraine headache for the pt. Non focal neuro exam. No recent head trauma. No fever. Doubt meningitis. Doubt intracranial bleed. Doubt IIH. No indication for imaging. Will treat with migraine cocktail and reevaluate.  5:32 PM Reported improvement.  The patient is safe for discharge with strict return precautions.   Final Clinical Impressions(s) / ED Diagnoses   Final diagnoses:  Bad headache   Disposition: Discharge  Condition: Good  I have discussed the results, Dx and Tx plan with the patient who expressed understanding and agree(s) with the plan. Discharge instructions discussed at great length. The patient was given strict return precautions who verbalized understanding of the instructions. No further questions at time of discharge.    New Prescriptions   No medications on file    Follow Up: Primary care provider  Schedule an appointment as soon as possible for a visit  As needed      Antwan Bribiesca, Amadeo GarnetPedro Eduardo, MD 11/16/16 1734

## 2016-12-25 IMAGING — US US MFM OB DETAIL+14 WK
1 series · 14 of 28 positions shown · non-contrast
Comparison: none

[Series 1: us mfm ob detail+14 wk · 89 acquisitions, 14 frames shown]
[im 4/89]
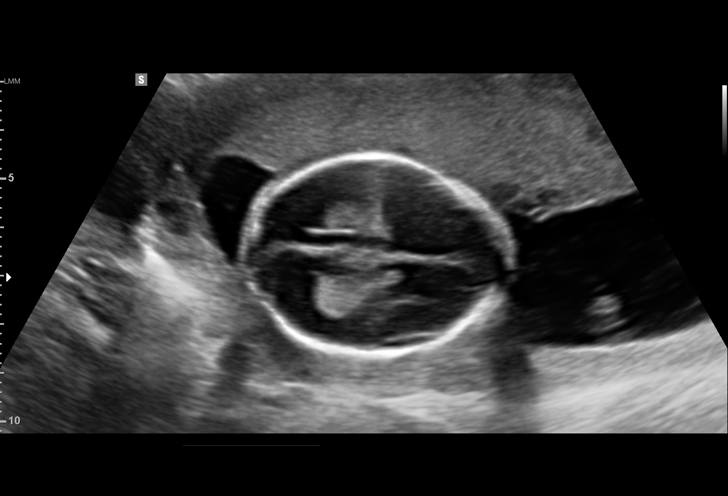
[im 10/89]
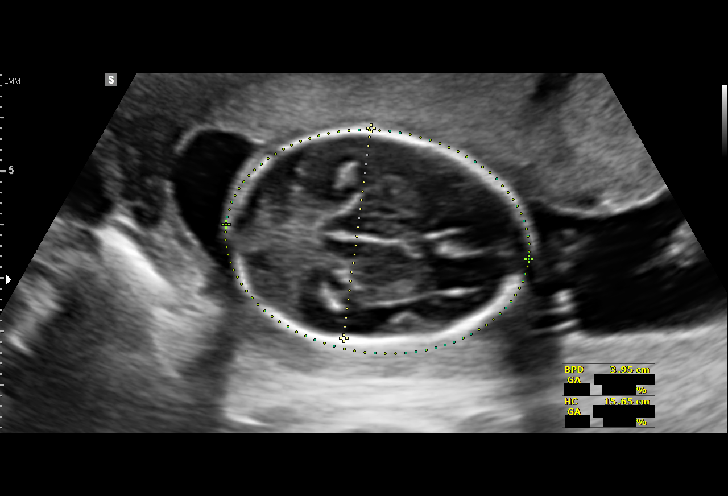
[im 17/89]
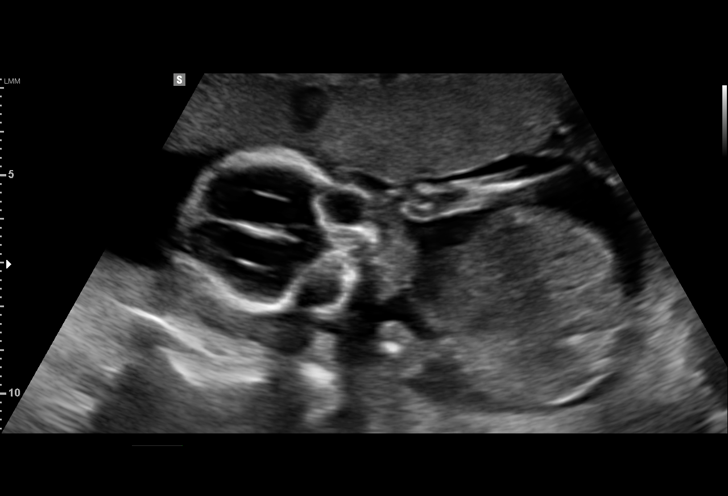
[im 23/89]
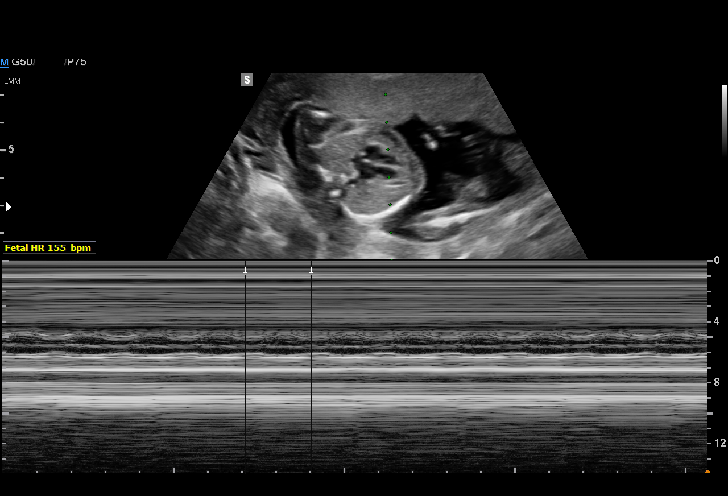
[im 30/89]
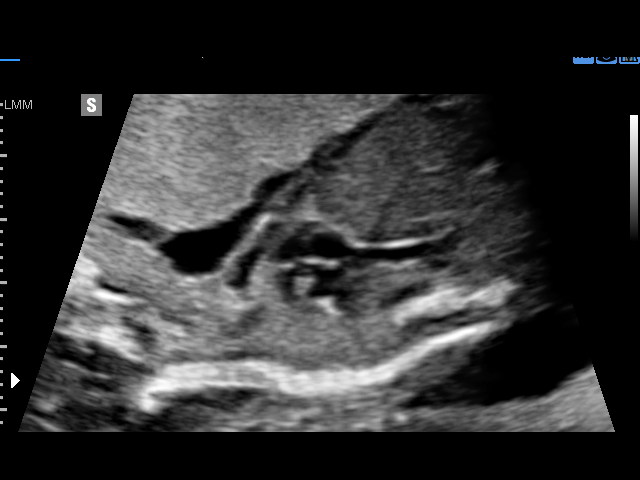
[im 36/89]
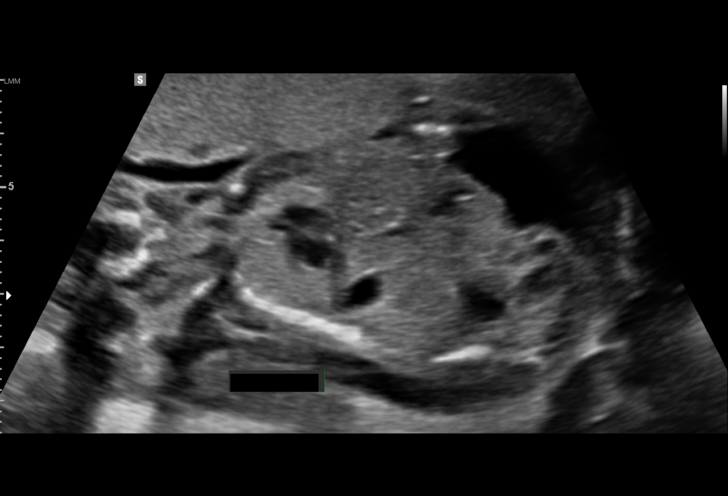
[im 43/89]
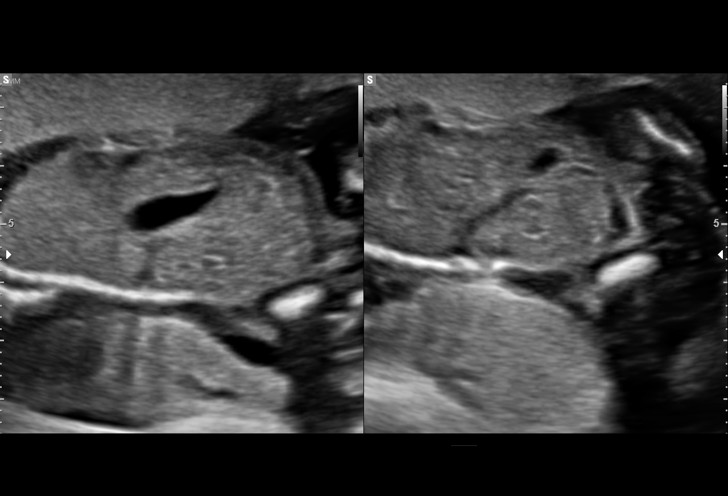
[im 49/89]
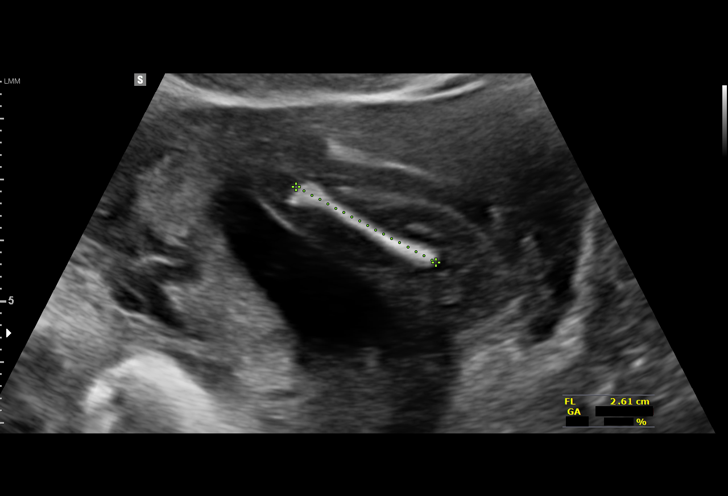
[im 56/89]
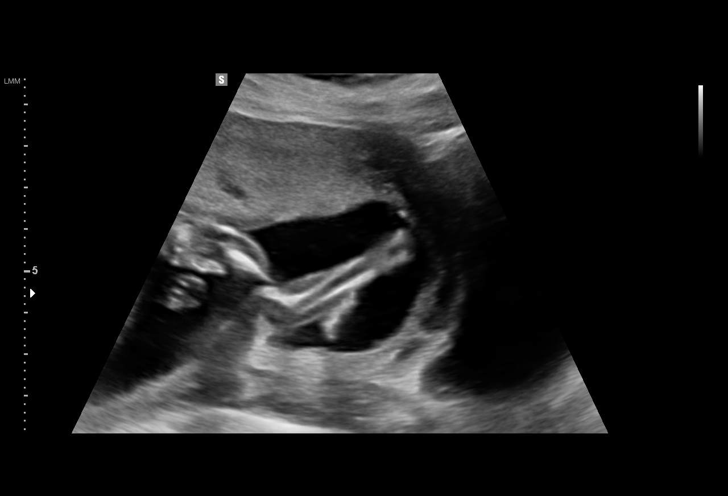
[im 62/89]
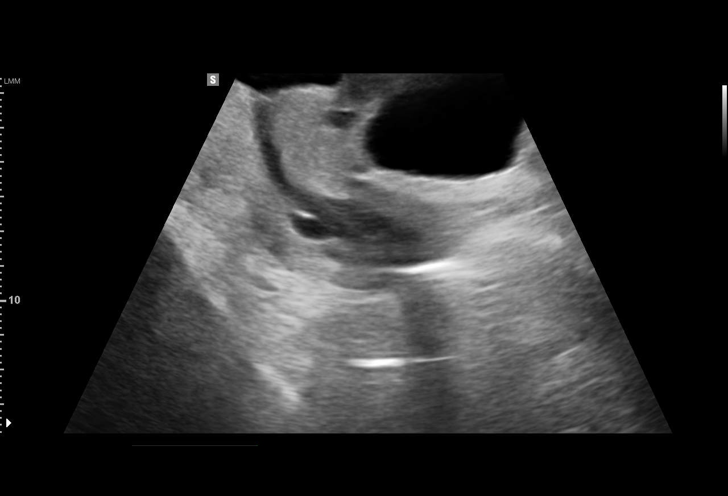
[im 69/89]
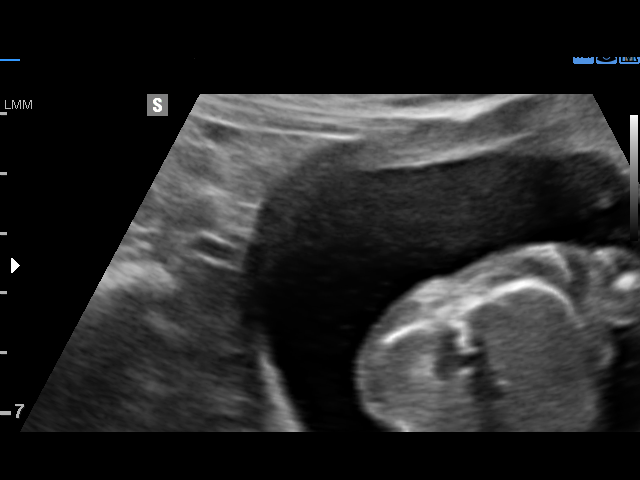
[im 75/89]
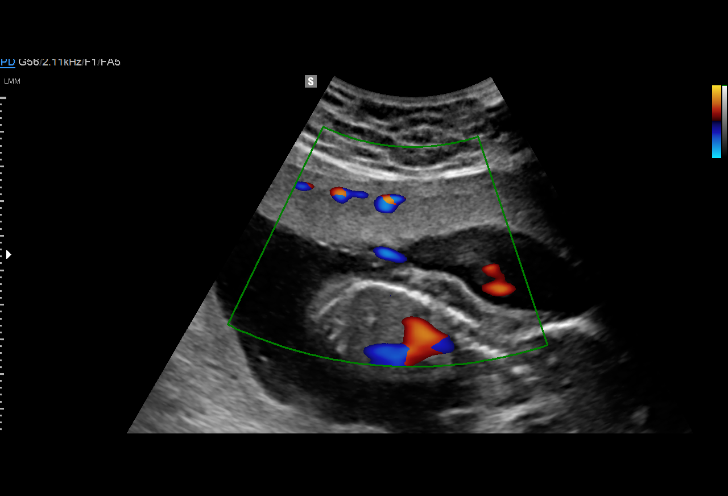
[im 82/89]
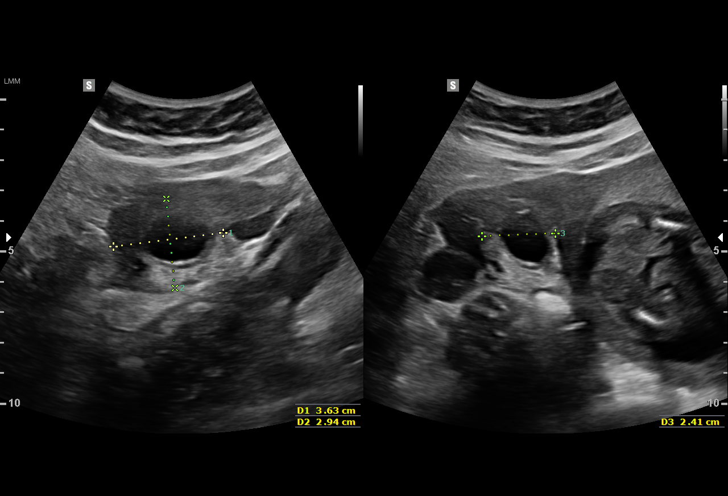
[im 89/89]
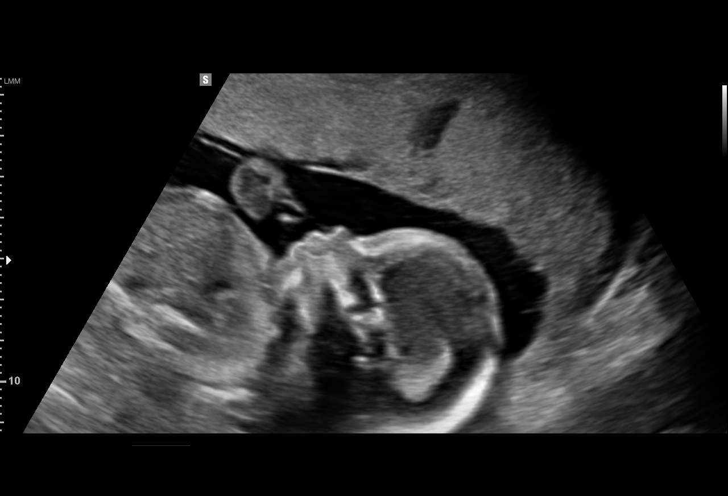

[14 of 28 positions shown; findings below may reference images not displayed]

[facility]ospital [facility]-
Faculty Physician
OB/Gyn Clinic

1  GJORGJI MATLAS            [id number]      [id number]     [id number]
Indications

Detailed fetal anatomic survey                 Z36
Gestational diabetes in pregnancy, diet
controlled
Obesity complicating pregnancy, second
trimester
18 weeks gestation of pregnancy
OB History

Gravidity:    3         Term:   2
Living:       2
Fetal Evaluation

Num Of Fetuses:     1
Fetal Heart         155
Rate(bpm):
Cardiac Activity:   Observed
Presentation:       Variable
Placenta:           Anterior, above cervical os
P. Cord Insertion:  Visualized, central

Amniotic Fluid
AFI FV:      Subjectively within normal limits
Largest Pocket(cm)
6.52
Biometry

BPD:      39.6  mm     G. Age:  18w 0d         22  %    CI:        64.74   %   70 - 86
FL/HC:      16.5   %   16.1 -
HC:      158.2  mm     G. Age:  18w 5d         42  %    HC/AC:      1.20       1.09 -
AC:      132.2  mm     G. Age:  18w 5d         47  %    FL/BPD:     65.9   %
FL:       26.1  mm     G. Age:  18w 0d         19  %    FL/AC:      19.7   %   20 - 24
HUM:      25.5  mm     G. Age:  18w 0d         32  %
CER:      18.8  mm     G. Age:  18w 3d         40  %
NFT:       2.3  mm

CM:        4.6  mm
Est. FW:     237  gm      0 lb 8 oz     38  %
Gestational Age

LMP:           18w 5d       Date:   06/06/15                 EDD:   03/12/16
U/S Today:     18w 3d                                        EDD:   03/14/16
Best:          18w 5d    Det. By:   LMP  (06/06/15)          EDD:   03/12/16
Anatomy

Cranium:               Appears normal         Aortic Arch:            Appears normal
Cavum:                 Appears normal         Ductal Arch:            Appears normal
Ventricles:            Appears normal         Diaphragm:              Appears normal
Choroid Plexus:        Appears normal         Stomach:                Appears normal, left
sided
Cerebellum:            Appears normal         Abdomen:                Appears normal
Posterior Fossa:       Appears normal         Abdominal Wall:         Appears nml (cord
insert, abd wall)
Nuchal Fold:           Appears normal         Cord Vessels:           Appears normal (3
vessel cord)
Face:                  Appears normal         Kidneys:                Appear normal
(orbits and profile)
Lips:                  Appears normal         Bladder:                Appears normal
Thoracic:              Appears normal         Spine:                  Appears normal
Heart:                 Appears normal         Upper Extremities:      Appears normal
(4CH, axis, and situs
RVOT:                  Appears normal         Lower Extremities:      Appears normal
LVOT:                  Appears normal

Other:  Fetus appears to be a male. Heels appear normal. Technically
difficult due to maternal habitus and fetal position.
Cervix Uterus Adnexa

Cervix
Length:            3.7  cm.
Normal appearance by transabdominal scan.

Uterus
No abnormality visualized.

Left Ovary
Within normal limits.

Right Ovary
Within normal limits.
Cul De Sac:   No free fluid seen.
Adnexa:       No abnormality visualized.
Impression

SIUP at 18+5 weeks
Normal detailed fetal anatomy
Markers of aneuploidy: none
Normal amniotic fluid volume
Measurements consistent with LMP dating
Recommendations

Follow-up ultrasounds as clinically indicated.

## 2017-02-23 IMAGING — US US MFM OB FOLLOW-UP
1 series · 14 of 28 positions shown · non-contrast
Comparison: none

[Series 1: us mfm ob follow-up · 14 of 43 slices shown]
[im 2/43]
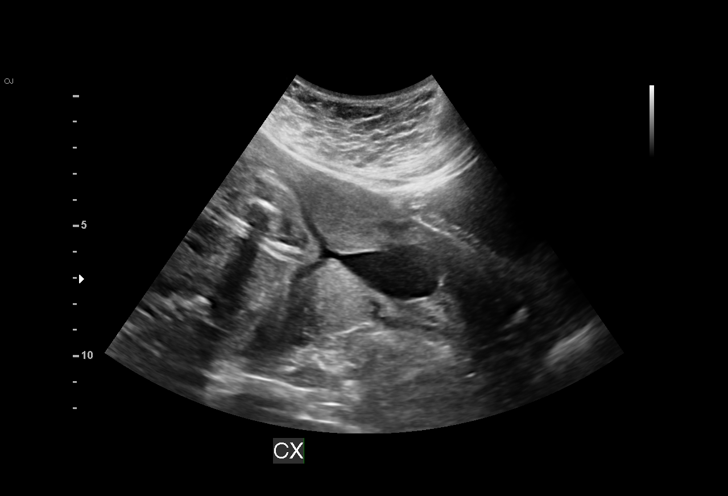
[im 5/43]
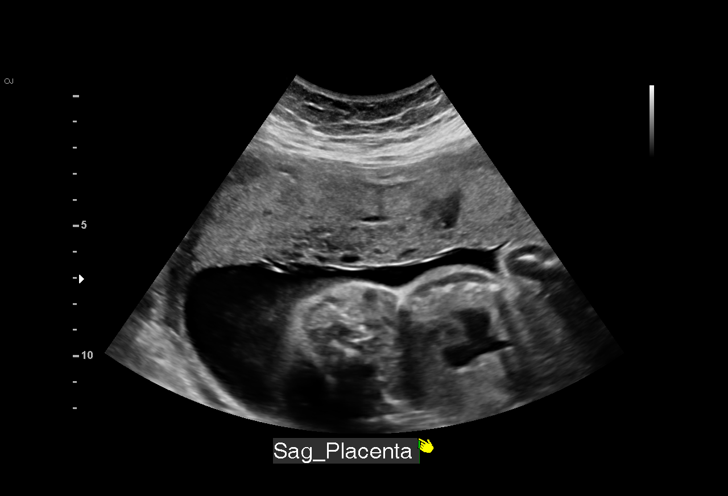
[im 8/43]
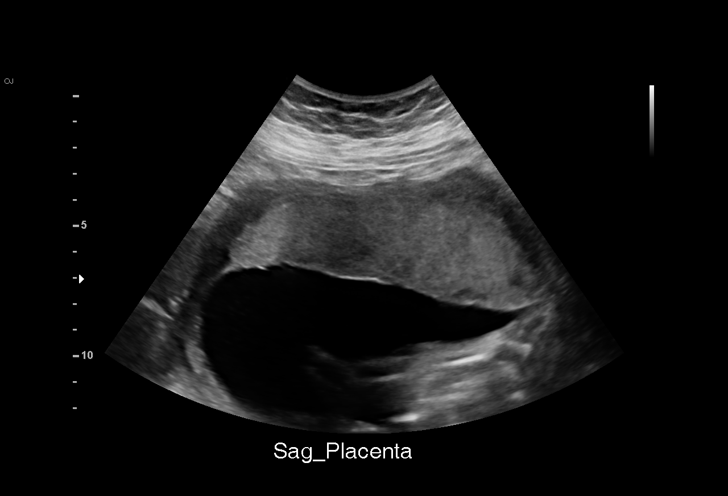
[im 11/43]
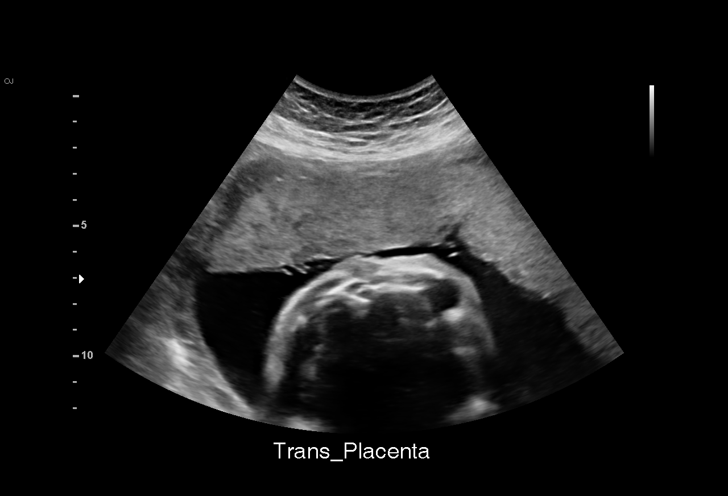
[im 15/43]
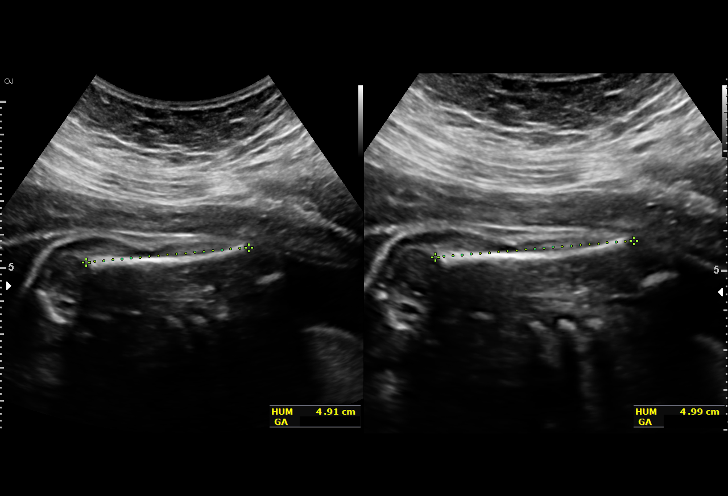
[im 18/43]
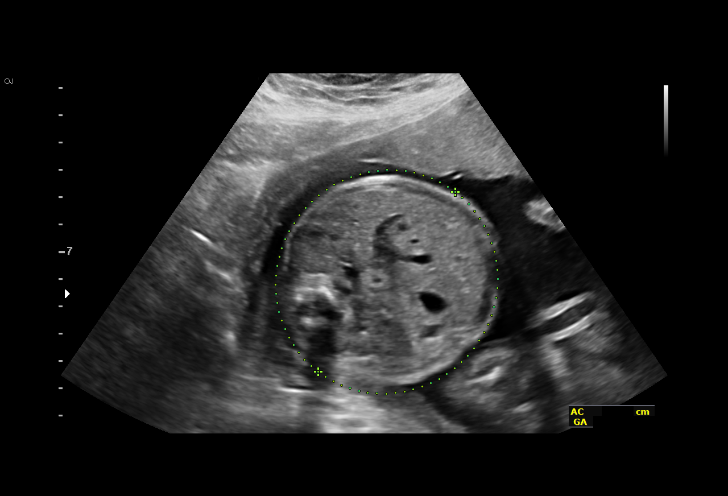
[im 21/43]
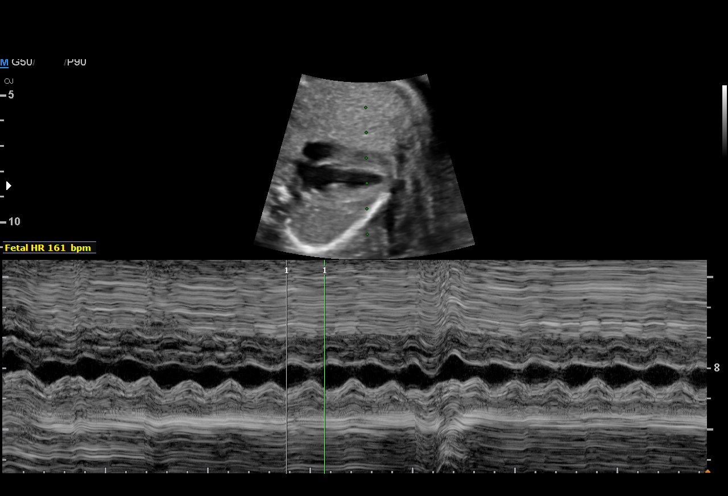
[im 24/43]
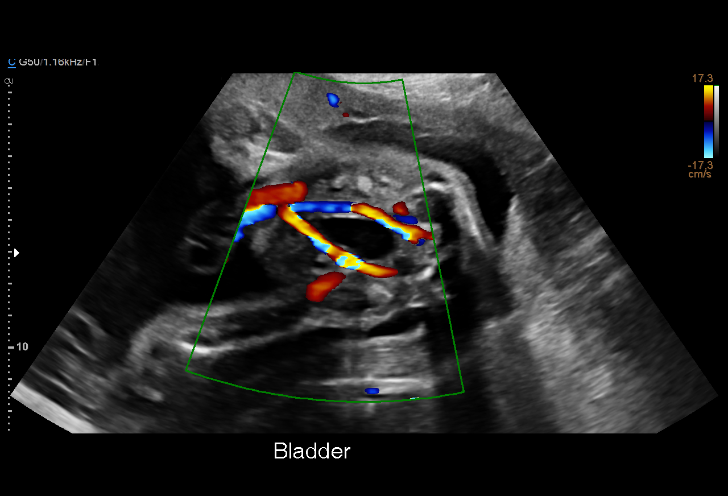
[im 27/43]
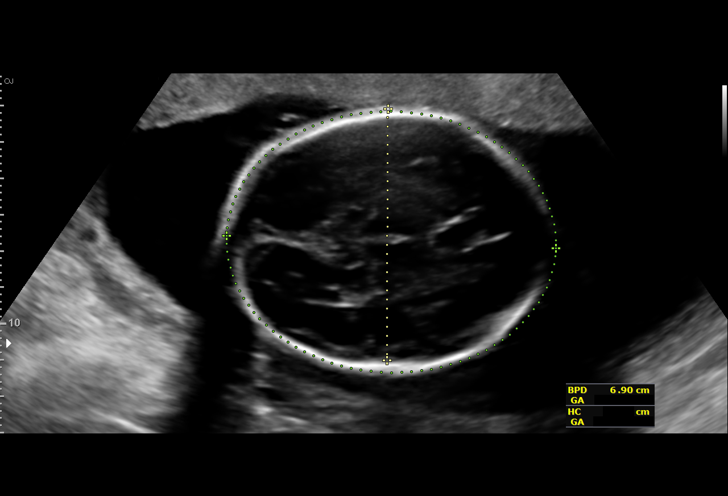
[im 30/43]
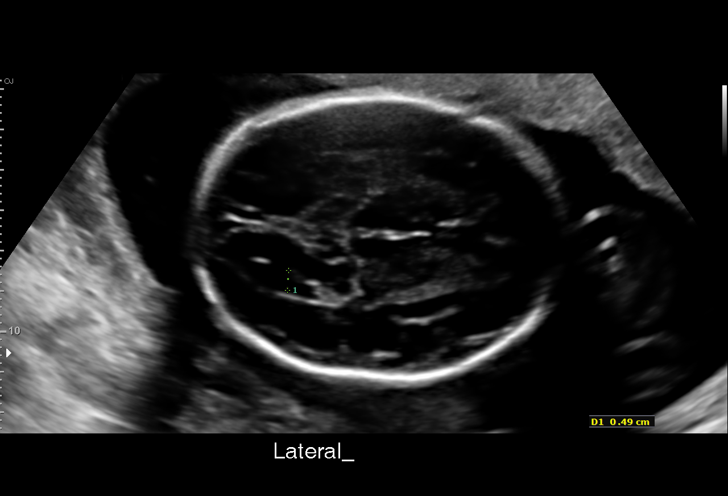
[im 33/43]
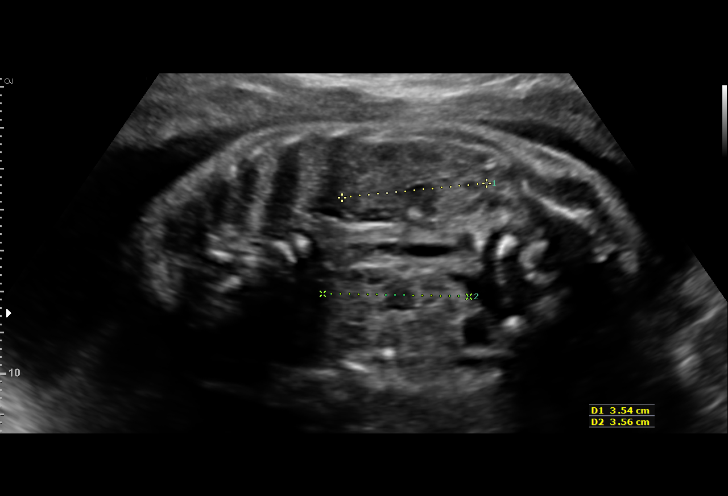
[im 36/43]
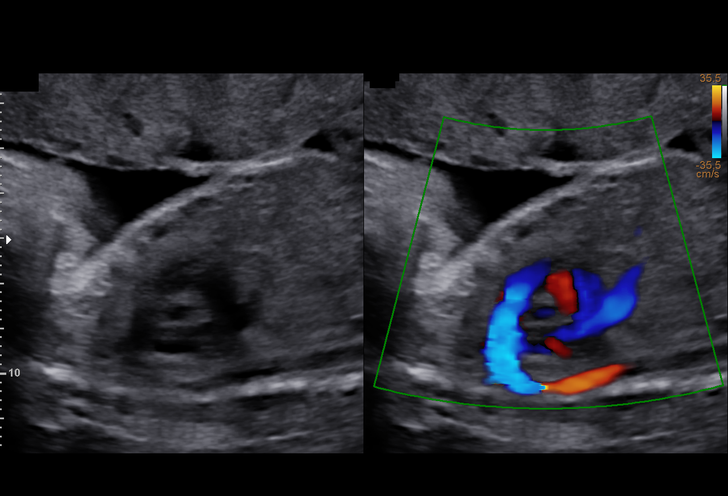
[im 39/43]
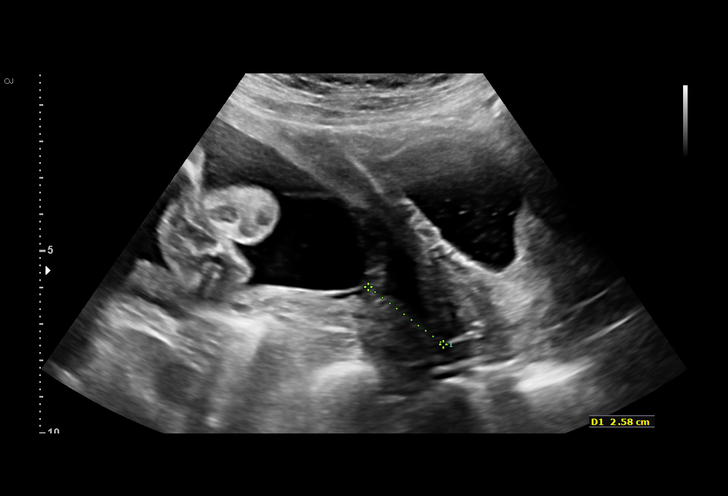
[im 43/43]
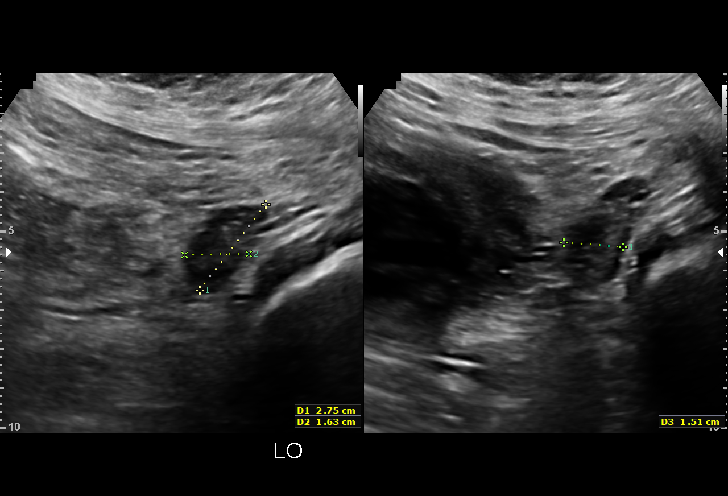

[14 of 28 positions shown; findings below may reference images not displayed]

OB/Gyn Clinic

1  JOSE LUIZ DE DIOS              [id number]      [id number]     [id number]
Indications

27 weeks gestation of pregnancy
Obesity complicating pregnancy, second
trimester
Gestational diabetes in pregnancy,
controlled by oral hypoglycemic drugs
(glyburide)
OB History

Gravidity:    3         Term:   2
Living:       2
Fetal Evaluation

Num Of Fetuses:     1
Fetal Heart         161
Rate(bpm):
Cardiac Activity:   Observed
Presentation:       Breech
Placenta:           Anterior, above cervical os
P. Cord Insertion:  Visualized
Amniotic Fluid
AFI FV:      Subjectively within normal limits

Largest Pocket(cm)
5.1
Biometry

BPD:      68.9  mm     G. Age:  27w 5d         53  %    CI:        70.32   %   70 - 86
FL/HC:      20.0   %   18.6 -
HC:       262   mm     G. Age:  28w 4d         61  %    HC/AC:      1.03       1.05 -
AC:      255.4  mm     G. Age:  29w 5d         95  %    FL/BPD:     76.1   %   71 - 87
FL:       52.4  mm     G. Age:  27w 6d         54  %    FL/AC:      20.5   %   20 - 24
HUM:      49.1  mm     G. Age:  29w 0d         78  %
CER:        34  mm     G. Age:  29w 4d         87  %
CM:        8.7  mm

Est. FW:    3924  gm    2 lb 14 oz      79  %
Gestational Age

LMP:           27w 2d       Date:   06/06/15                 EDD:   03/12/16
U/S Today:     28w 3d                                        EDD:   03/04/16
Best:          27w 2d    Det. By:   LMP  (06/06/15)          EDD:   03/12/16
Anatomy

Cranium:               Appears normal         Aortic Arch:            Previously seen
Cavum:                 Appears normal         Ductal Arch:            Appears normal
Ventricles:            Appears normal         Diaphragm:              Appears normal
Choroid Plexus:        Previously seen        Stomach:                Appears normal, left
sided
Cerebellum:            Appears normal         Abdomen:                Appears normal
Posterior Fossa:       Appears normal         Abdominal Wall:         Previously seen
Nuchal Fold:           Appears normal         Cord Vessels:           Appears normal (3
vessel cord)
Face:                  Orbits and profile     Kidneys:                Appear normal
previously seen
Lips:                  Previously seen        Bladder:                Appears normal
Thoracic:              Appears normal         Spine:                  Previously seen
Heart:                 Appears normal         Upper Extremities:      Previously seen
(4CH, axis, and situs
RVOT:                  Appears normal         Lower Extremities:      Previously seen
LVOT:                  Previously seen

Other:  Fetus appears to be a male. Heels Prev. normal. Technically difficult
due to maternal habitus and fetal position.
Cervix Uterus Adnexa

Cervix
Length:            2.6  cm.
Normal appearance by transabdominal scan.

Uterus
No abnormality visualized.

Left Ovary
Within normal limits.
Right Ovary
Within normal limits.
Impression

SIUP at 27+2 weeks
Normal interval anatomy; anatomic survey complete
Normal amniotic fluid volume
Appropriate interval growth with EFW at the 79th %tile
Recommendations

Follow-up ultrasound for growth in 4 weeks

## 2017-04-19 ENCOUNTER — Emergency Department (HOSPITAL_COMMUNITY)
Admission: EM | Admit: 2017-04-19 | Discharge: 2017-04-19 | Disposition: A | Discharge status: Home / Self Care | Payer: Self-pay | Attending: Emergency Medicine | Admitting: Emergency Medicine

## 2017-04-19 ENCOUNTER — Encounter (HOSPITAL_COMMUNITY): Payer: Self-pay | Admitting: Emergency Medicine

## 2017-04-19 ENCOUNTER — Other Ambulatory Visit: Payer: Self-pay

## 2017-04-19 DIAGNOSIS — R51 Headache: Secondary | ICD-10-CM | POA: Insufficient documentation

## 2017-04-19 DIAGNOSIS — F17210 Nicotine dependence, cigarettes, uncomplicated: Secondary | ICD-10-CM | POA: Insufficient documentation

## 2017-04-19 DIAGNOSIS — R519 Headache, unspecified: Secondary | ICD-10-CM

## 2017-04-19 MED ORDER — IBUPROFEN 400 MG PO TABS
400.0000 mg | ORAL_TABLET | Freq: Once | ORAL | Status: AC
Start: 2017-04-19 — End: 2017-04-19
  Administered 2017-04-19: 400 mg via ORAL
  Filled 2017-04-19: qty 1

## 2017-04-19 NOTE — Discharge Instructions (Signed)
For pain control please take ibuprofen (also known as Motrin or Advil) 800mg (this is normally 4 over the counter pills) 3 times a day  for 5 days. Take with food to minimize stomach irritation. ° ° °Please follow with your primary care doctor in the next 2 days for a check-up. They must obtain records for further management.  ° °Do not hesitate to return to the Emergency Department for any new, worsening or concerning symptoms.  ° °
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
## 2017-06-03 ENCOUNTER — Other Ambulatory Visit: Payer: Self-pay

## 2017-06-03 ENCOUNTER — Encounter (HOSPITAL_COMMUNITY): Payer: Self-pay

## 2017-06-03 ENCOUNTER — Emergency Department (HOSPITAL_COMMUNITY)
Admission: EM | Admit: 2017-06-03 | Discharge: 2017-06-03 | Disposition: A | Discharge status: Home / Self Care | Payer: Self-pay | Attending: Emergency Medicine | Admitting: Emergency Medicine

## 2017-06-03 ENCOUNTER — Emergency Department (HOSPITAL_COMMUNITY): Payer: Self-pay

## 2017-06-03 DIAGNOSIS — R112 Nausea with vomiting, unspecified: Secondary | ICD-10-CM | POA: Insufficient documentation

## 2017-06-03 DIAGNOSIS — R509 Fever, unspecified: Secondary | ICD-10-CM | POA: Insufficient documentation

## 2017-06-03 DIAGNOSIS — F17210 Nicotine dependence, cigarettes, uncomplicated: Secondary | ICD-10-CM | POA: Insufficient documentation

## 2017-06-03 DIAGNOSIS — J189 Pneumonia, unspecified organism: Secondary | ICD-10-CM | POA: Insufficient documentation

## 2017-06-03 DIAGNOSIS — M791 Myalgia, unspecified site: Secondary | ICD-10-CM | POA: Insufficient documentation

## 2017-06-03 DIAGNOSIS — R51 Headache: Secondary | ICD-10-CM | POA: Insufficient documentation

## 2017-06-03 MED ORDER — DOXYCYCLINE HYCLATE 100 MG PO CAPS: 100.0000 mg | ORAL_CAPSULE | Freq: Two times a day (BID) | ORAL | 0 refills | Status: DC

## 2017-06-03 MED ORDER — IBUPROFEN 800 MG PO TABS
800.0000 mg | ORAL_TABLET | Freq: Once | ORAL | Status: AC
  Administered 2017-06-03: 800 mg via ORAL
  Filled 2017-06-03: qty 1

## 2017-06-03 MED ORDER — ONDANSETRON 4 MG PO TBDP: 4.0000 mg | ORAL_TABLET | Freq: Three times a day (TID) | ORAL | 0 refills | Status: DC | PRN

## 2017-06-03 MED ORDER — ONDANSETRON 4 MG PO TBDP
8.0000 mg | ORAL_TABLET | Freq: Once | ORAL | Status: AC
Start: 2017-06-03 — End: 2017-06-03
  Administered 2017-06-03: 8 mg via ORAL
  Filled 2017-06-03: qty 2

## 2017-06-03 MED ORDER — DOXYCYCLINE HYCLATE 100 MG PO TABS
100.0000 mg | ORAL_TABLET | Freq: Once | ORAL | Status: AC
Start: 2017-06-03 — End: 2017-06-03
  Administered 2017-06-03: 100 mg via ORAL
  Filled 2017-06-03: qty 1

## 2017-06-03 NOTE — ED Provider Notes (Signed)
[facility] EMERGENCY DEPARTMENT Provider Note   CSN: [id number] Arrival date & time: 06/03/17  1800     History   Chief Complaint Chief Complaint  Patient presents with  . cold, cough, body aches    HPI Sandra Mcknight is a 37 y.o. female who presents with cough.  He had URI symptoms 3 weeks.  Started with fever, chills, body ache, and a cough.  Multiple family members in her household had the same illness however they improved.  She has been taking Mucinex, DayQuil, NyQuil without significant relief.  Fevers have gone away but now she just has a cough, diffuse body aches, headache, nausea, and emesis.  She denies any chest pain, SOB, diarrhea or urinary symptoms.  She does not have a primary care provider or insurance. She has not had a flu shot this year. She smokes tobacco socially but not regularly.  HPI  Past Medical History:  Diagnosis Date  . Gestational diabetes   . Meningitis   . Obesity (BMI 35.0-39.9 without comorbidity)     Patient Active Problem List   Diagnosis Date Noted  . Muscle spasm 03/31/2016  . PROM (premature rupture of membranes) 02/19/2016  . GBS (group B Streptococcus carrier), +RV culture, currently pregnant 02/12/2016  . Migraine 10/04/2015  . Gestational diabetes mellitus (GDM) affecting pregnancy, antepartum 09/23/2015  . Rubella non-immune status, antepartum 09/19/2015  . Obesity 03/31/2011    Past Surgical History:  Procedure Laterality Date  . NO PAST SURGERIES      OB History    Gravida Para Term Preterm AB Living   3 3 3     3    SAB TAB Ectopic Multiple Live Births         0 3       Home Medications    Prior to Admission medications   Medication Sig Start Date End Date Taking? Authorizing Provider  acetaminophen (TYLENOL) 500 MG tablet Take 1,000 mg by mouth every 6 (six) hours as needed for moderate pain or headache.    [provider]  cyclobenzaprine (FLEXERIL) 10 MG tablet Take 1 tablet (10  mg total) by mouth every 8 (eight) hours as needed for muscle spasms. Patient not taking: Reported on 11/16/2016 03/31/16   Glyn Ade, Scot Jun, PA-C  ibuprofen (ADVIL,MOTRIN) 600 MG tablet Take 1 tablet (600 mg total) by mouth every 6 (six) hours. Patient not taking: Reported on 11/16/2016 02/21/16   Tillman Sers, DO  senna-docusate (SENOKOT-S) 8.6-50 MG tablet Take 2 tablets by mouth at bedtime. Patient not taking: Reported on 11/16/2016 02/21/16   Tillman Sers, DO  topiramate (TOPAMAX) 25 MG tablet Take 3 tablets (75 mg total) by mouth daily. Begin with 25mg  and increase weekly to 75mg  Patient not taking: Reported on 11/16/2016 03/31/16   Glyn Ade, Scot Jun, PA-C    Family History Family History  Problem Relation Age of Onset  . Diabetes Mother     Social History Social History   Tobacco Use  . Smoking status: Light Tobacco Smoker    Types: Cigarettes    Last attempt to quit: 06/23/2015    Years since quitting: 1.9  . Smokeless tobacco: Former Engineer, water Use Topics  . Alcohol use: Yes    Comment: socially  . Drug use: No     Allergies   Patient has no known allergies.   Review of Systems Review of Systems  Constitutional: Positive for chills and fever (resolved).  HENT: Positive  for congestion and sore throat. Negative for ear pain and rhinorrhea.   Respiratory: Positive for cough. Negative for shortness of breath and wheezing.   Cardiovascular: Negative for chest pain.  Gastrointestinal: Positive for nausea and vomiting. Negative for abdominal pain and diarrhea.  Genitourinary: Negative for dysuria.  Musculoskeletal: Positive for myalgias.  Neurological: Positive for headaches.     Physical Exam Updated Vital Signs BP (!) 142/99   Pulse (!) 123   Temp 99.9 F (37.7 C) (Oral)   Resp 20   LMP 05/14/2017 (Exact Date)   SpO2 96%   Physical Exam  Constitutional: She is oriented to person, place, and time. She appears well-developed and well-nourished.  No distress.  Hoarse voice  HENT:  Head: Normocephalic and atraumatic.  Right Ear: Hearing, tympanic membrane, external ear and ear canal normal.  Left Ear: Hearing, external ear and ear canal normal.  Nose: Nose normal.  Mouth/Throat: Uvula is midline, oropharynx is clear and moist and mucous membranes are normal.  Cerumen impaction on the left  Eyes: Conjunctivae are normal. Pupils are equal, round, and reactive to light. Right eye exhibits no discharge. Left eye exhibits no discharge. No scleral icterus.  Neck: Normal range of motion.  Cardiovascular: Regular rhythm. Tachycardia present. Exam reveals no gallop and no friction rub.  No murmur heard. Pulmonary/Chest: Effort normal. No respiratory distress. She has wheezes (in right upper lung field).  Abdominal: Soft. Bowel sounds are normal. She exhibits no distension. There is no tenderness.  Neurological: She is alert and oriented to person, place, and time.  Skin: Skin is warm and dry.  Psychiatric: She has a normal mood and affect. Her behavior is normal.  Nursing note and vitals reviewed.    ED Treatments / Results  Labs (all labs ordered are listed, but only abnormal results are displayed) Labs Reviewed - No data to display  EKG  EKG Interpretation None       Radiology Dg Chest 2 View  Result Date: 06/03/2017 CLINICAL DATA:  Cough EXAM: CHEST  2 VIEW COMPARISON:  02/12/2013 chest radiograph. FINDINGS: Stable cardiomediastinal silhouette with normal heart size. No pneumothorax. No pleural effusion. Patchy opacity in the lingula obscures the left heart border. Otherwise clear lungs. IMPRESSION: Lingular pneumonia. Recommend follow-up PA and lateral post treatment chest radiographs in 4-6 weeks. Electronically Signed   By: Delbert PhenixJason A Poff M.D.   On: 06/03/2017 19:15    Procedures Procedures (including critical care time)  Medications Ordered in ED Medications  ibuprofen (ADVIL,MOTRIN) tablet 800 mg (not administered)    ondansetron (ZOFRAN-ODT) disintegrating tablet 8 mg (not administered)  doxycycline (VIBRA-TABS) tablet 100 mg (not administered)     Initial Impression / Assessment and Plan / ED Course  I have reviewed the triage vital signs and the nursing notes.  Pertinent labs & imaging results that were available during my care of the patient were reviewed by me and considered in my medical decision making (see chart for details).  37 year old female with cough for 3 weeks.  She is tachycardic in triage and mildly hypertensive.  She is mildly ill-appearing on exam.  She has wheezes on her lung exam but she denies shortness of breath.  Chest x-ray shows a lingular pneumonia.  We will give ibuprofen, Zofran, doxycycline or p.o. challenge.  Will consult case management to ensure close follow-up.  She tolerated medicines.  She was given a prescription for doxycycline and Zofran.  Case management was consulted and they will call her in the morning for  assistance with obtaining a follow-up appointment.  Repeat vital signs are improved.  Return precautions were given.  Final Clinical Impressions(s) / ED Diagnoses   Final diagnoses:  Lingular pneumonia    ED Discharge Orders    None       Beryle Quant 06/03/17 2211    Lorre Nick, MD 06/03/17 [phone]

## 2017-06-03 NOTE — Discharge Instructions (Signed)
Please take Doxycycline twice a day for the next week Please follow up with a primary doctor in the next 4-6 weeks to ensure the pneumonia is getting better Continue over the counter cough/cold medicines Take Zofran as needed for nausea Return if worsening

## 2017-06-03 NOTE — ED Triage Notes (Signed)
Patient complains of 3 weeks of cough, body aches and congestion. States that she has ear fullness with same, NAD

## 2017-06-05 ENCOUNTER — Emergency Department (HOSPITAL_COMMUNITY)
Admission: EM | Admit: 2017-06-05 | Discharge: 2017-06-05 | Disposition: A | Discharge status: Home / Self Care | Payer: Self-pay | Attending: Emergency Medicine | Admitting: Emergency Medicine

## 2017-06-05 ENCOUNTER — Encounter (HOSPITAL_COMMUNITY): Payer: Self-pay

## 2017-06-05 DIAGNOSIS — R220 Localized swelling, mass and lump, head: Secondary | ICD-10-CM | POA: Insufficient documentation

## 2017-06-05 DIAGNOSIS — M791 Myalgia, unspecified site: Secondary | ICD-10-CM | POA: Insufficient documentation

## 2017-06-05 DIAGNOSIS — F17210 Nicotine dependence, cigarettes, uncomplicated: Secondary | ICD-10-CM | POA: Insufficient documentation

## 2017-06-05 DIAGNOSIS — T783XXA Angioneurotic edema, initial encounter: Secondary | ICD-10-CM | POA: Insufficient documentation

## 2017-06-05 DIAGNOSIS — T7840XA Allergy, unspecified, initial encounter: Secondary | ICD-10-CM | POA: Insufficient documentation

## 2017-06-05 MED ORDER — METHYLPREDNISOLONE SODIUM SUCC 125 MG IJ SOLR
125.0000 mg | Freq: Once | INTRAMUSCULAR | Status: AC
  Administered 2017-06-05: 125 mg via INTRAVENOUS
  Filled 2017-06-05: qty 2

## 2017-06-05 MED ORDER — DIPHENHYDRAMINE HCL 50 MG/ML IJ SOLN
25.0000 mg | Freq: Once | INTRAMUSCULAR | Status: AC
  Administered 2017-06-05: 25 mg via INTRAVENOUS
  Filled 2017-06-05: qty 1

## 2017-06-05 MED ORDER — PREDNISONE 20 MG PO TABS
20.0000 mg | ORAL_TABLET | Freq: Two times a day (BID) | ORAL | 0 refills | Status: DC
Start: 2017-06-05 — End: 2019-08-15

## 2017-06-05 MED ORDER — EPINEPHRINE PF 1 MG/10ML IJ SOSY
0.3000 mg | PREFILLED_SYRINGE | Freq: Once | INTRAMUSCULAR | Status: AC
Start: 2017-06-05 — End: 2017-06-05
  Administered 2017-06-05: 0.3 mg via INTRAMUSCULAR
  Filled 2017-06-05: qty 10

## 2017-06-05 MED ORDER — FAMOTIDINE 20 MG IN NS 100 ML IVPB
20.0000 mg | Freq: Once | INTRAVENOUS | Status: AC
  Administered 2017-06-05: 20 mg via INTRAVENOUS
  Filled 2017-06-05 (×2): qty 100

## 2017-06-05 NOTE — ED Notes (Signed)
MD Wentz at the bedside  

## 2017-06-05 NOTE — ED Provider Notes (Signed)
10:00-patient being treated for allergic reaction, characterized by tongue swelling, suspected to be secondary to Mucinex.  At this time she reports feeling better with decreased swelling of her tongue.  Oropharynx visualized, she has very mild swelling of the tongue, no sublingual swelling, no dysarthria, or stridor.  Findings discussed with the patient and all questions were answered.  Plan-prescription for prednisone, and recommend to use Benadryl and Pepcid for 5 days.   Mancel BaleWentz, Malique Driskill, MD 06/05/17 1013

## 2017-06-05 NOTE — ED Provider Notes (Signed)
[facility] Provider Note   CSN: [id number] Arrival date & time: 06/05/17  0429  Time seen 05:22 AM   History   Chief Complaint Chief Complaint  Patient presents with  . Allergic Reaction    HPI Sandra Mcknight is a 37 y.o. female.  HPI patient states she has taken Mucinex in the past without difficulty.  She states she took it for the first time this morning with this illness about 3 AM.  She states almost immediately, less than 5 minutes she states her tongue did not feel right.  She also states she feels like it is hard to swallow but she denies any difficulty breathing, wheezing, rash, or itching.  She is never had this happen before.  Patient reports cough that started 3 weeks ago with fevers the first week but not now, and body aches.  She was seen in the ED on February 17 and diagnosed with a lingular pneumonia.  She was started on oral doxycycline in the ED for community-acquired pneumonia.  She states since she got the antibiotic she is feeling better.  She however has not gotten her prescription filled yet. Interestingly looking at her note from the 17th she describes taking Mucinex at that time.   PCP none   Past Medical History:  Diagnosis Date  . Gestational diabetes   . Meningitis   . Obesity (BMI 35.0-39.9 without comorbidity)     Patient Active Problem List   Diagnosis Date Noted  . Muscle spasm 03/31/2016  . PROM (premature rupture of membranes) 02/19/2016  . GBS (group B Streptococcus carrier), +RV culture, currently pregnant 02/12/2016  . Migraine 10/04/2015  . Gestational diabetes mellitus (GDM) affecting pregnancy, antepartum 09/23/2015  . Rubella non-immune status, antepartum 09/19/2015  . Obesity 03/31/2011    Past Surgical History:  Procedure Laterality Date  . NO PAST SURGERIES      OB History    Gravida Para Term Preterm AB Living   3 3 3     3    SAB TAB Ectopic Multiple Live Births         0 3         Home Medications    Denies being on BP meds such as ACEI  Prior to Admission medications   Medication Sig Start Date End Date Taking? Authorizing Provider  acetaminophen (TYLENOL) 500 MG tablet Take 1,000 mg by mouth every 6 (six) hours as needed for moderate pain or headache.   Yes [provider]  guaiFENesin (MUCINEX) 600 MG 12 hr tablet Take 600 mg by mouth 2 (two) times daily as needed for cough or to loosen phlegm.   Yes [provider]  Pseudoephedrine-APAP-DM (DAYQUIL PO) Take 15 mLs by mouth daily as needed (cold).   Yes [provider]  doxycycline (VIBRAMYCIN) 100 MG capsule Take 1 capsule (100 mg total) by mouth 2 (two) times daily. Patient not taking: Reported on 06/05/2017 06/03/17   Bethel BornGekas, Kelly Marie, PA-C  ondansetron (ZOFRAN ODT) 4 MG disintegrating tablet Take 1 tablet (4 mg total) by mouth every 8 (eight) hours as needed for nausea or vomiting. Patient not taking: Reported on 06/05/2017 06/03/17   Bethel BornGekas, Kelly Marie, PA-C    Family History Family History  Problem Relation Age of Onset  . Diabetes Mother     Social History Social History   Tobacco Use  . Smoking status: Light Tobacco Smoker    Types: Cigarettes    Last attempt to quit:  06/23/2015    Years since quitting: 1.9  . Smokeless tobacco: Former Engineer, water Use Topics  . Alcohol use: Yes    Comment: socially  . Drug use: No     Allergies   Patient has no known allergies.   Review of Systems Review of Systems  All other systems reviewed and are negative.    Physical Exam Updated Vital Signs BP 123/72   Pulse (!) 114   Temp 98.5 F (36.9 C) (Oral)   Resp 18   LMP 05/14/2017 (Exact Date)   SpO2 99%   Vital signs normal except tachycardia   Physical Exam  Constitutional: She is oriented to person, place, and time. She appears well-developed and well-nourished. She appears distressed.  HENT:  Head: Normocephalic and atraumatic.  Right Ear:  External ear normal.  Left Ear: External ear normal.  Nose: Nose normal.  Pt has thickness of her tongue and her speech is slightly thick. No drooling.   Eyes: Conjunctivae and EOM are normal. Pupils are equal, round, and reactive to light.  Neck: Normal range of motion. Neck supple.  Cardiovascular: Intact distal pulses and normal pulses. Tachycardia present.  No murmur heard. Pulmonary/Chest: Effort normal and breath sounds normal. No respiratory distress.  Musculoskeletal: Normal range of motion. She exhibits no edema, tenderness or deformity.  Neurological: She is alert and oriented to person, place, and time. No cranial nerve deficit.  Skin: Skin is warm and dry. No rash noted. No erythema.  Psychiatric: She has a normal mood and affect. Her behavior is normal. Thought content normal.  Nursing note and vitals reviewed.        ED Treatments / Results  Labs (all labs ordered are listed, but only abnormal results are displayed) Labs Reviewed - No data to display  EKG  EKG Interpretation None       Radiology Dg Chest 2 View  Result Date: 06/03/2017 CLINICAL DATA:  Cough EXAM: CHEST  2 VIEW COMPARISON:  02/12/2013 chest radiograph. FINDINGS: Stable cardiomediastinal silhouette with normal heart size. No pneumothorax. No pleural effusion. Patchy opacity in the lingula obscures the left heart border. Otherwise clear lungs. IMPRESSION: Lingular pneumonia. Recommend follow-up PA and lateral post treatment chest radiographs in 4-6 weeks. Electronically Signed   By: Delbert Phenix M.D.   On: 06/03/2017 19:15    Procedures Procedures (including critical care time)  Medications Ordered in ED Medications  EPINEPHrine (ADRENALIN) 1 MG/10ML injection 0.3 mg (not administered)  methylPREDNISolone sodium succinate (SOLU-MEDROL) 125 mg/2 mL injection 125 mg (125 mg Intravenous Given 06/05/17 0528)  famotidine (PEPCID) IVPB 20 mg in NS 100 mL IVPB (20 mg Intravenous Given 06/05/17 0534)    diphenhydrAMINE (BENADRYL) injection 25 mg (25 mg Intravenous Given 06/05/17 0525)     Initial Impression / Assessment and Plan / ED Course  I have reviewed the triage vital signs and the nursing notes.  Pertinent labs & imaging results that were available during my care of the patient were reviewed by me and considered in my medical decision making (see chart for details).    Patient was started on IV Benadryl, Pepcid, and Solu-Medrol for her acute allergic reaction.  When I review her chest x-ray that was ordered earlier she is also noted to have a lingular pneumonia.  Recheck at 6:45 AM patient states she feels like her tongue is not improved.  When I look at it I think from the side it looks a little less swollen.  She was given epi  0.3 cc IM.  07:15 AM patient turned over to Dr Effie Shy at change of shift.   Final Clinical Impressions(s) / ED Diagnoses   Final diagnoses:  Acute allergic reaction, initial encounter  Angioedema, initial encounter  Tongue swelling    Disposition pending  Devoria Albe, MD, Concha Pyo, MD 06/05/17 [phone]

## 2017-06-05 NOTE — ED Notes (Signed)
ED Provider at bedside. 

## 2017-06-05 NOTE — Discharge Instructions (Signed)
You appear to be allergic to Mucinex, so do not take it anymore.  You could be allergic to the guaifenesin which is in the Mucinex, or other components which are in the medication.  To help treat the reaction we are prescribing prednisone.  He will also help to take 2 types of antihistamine.  Use diphenhydramine 25 mg 4 times a day, and famotidine 20 mg twice a day, each for 5 days, to treat the reaction.  You should start taking the antibiotic which was prescribed for pneumonia.  Return here, if needed, for problems.

## 2017-06-05 NOTE — ED Provider Notes (Signed)
MSE was initiated and I personally evaluated the patient and placed orders (if any) at  4:53 AM on June 05, 2017.  Asked to see patient in triage with tongue swelling.  This started 30 minutes ago after taking Mucinex and pink lemonade.  She denies any difficulty swallowing or difficulty breathing.  He does not take lisinopril.  She was seen in the ED last night and diagnosed with pneumonia but has not filled her medications yet.  Patient has mild swelling of her tongue worse in the left side.  There is no sublingual edema.  She is tolerating her secretions her speech is mildly dysarthric. Lungs are clear.  Apparent allergic reaction.  She is given steroids and antihistamines.  Will be monitored for progression of her angioedema.  The patient appears stable so that the remainder of the MSE may be completed by another provider.     Glynn Octaveancour, Jaice Lague, MD 06/05/17 [phone]

## 2017-06-05 NOTE — ED Triage Notes (Signed)
Pt states that she was seen yesterday and diagnosed with pneumonia, tonight around 330 she took a mucinex and then her tongue started to swell. Some swelling noted, denies SOB

## 2017-07-23 ENCOUNTER — Encounter: Payer: Self-pay | Admitting: *Deleted

## 2018-04-06 ENCOUNTER — Encounter (HOSPITAL_COMMUNITY): Payer: Self-pay | Admitting: Emergency Medicine

## 2018-04-06 ENCOUNTER — Emergency Department (HOSPITAL_COMMUNITY)
Admission: EM | Admit: 2018-04-06 | Discharge: 2018-04-06 | Disposition: A | Discharge status: Home / Self Care | Payer: Medicaid Other | Attending: Emergency Medicine | Admitting: Emergency Medicine

## 2018-04-06 ENCOUNTER — Other Ambulatory Visit: Payer: Self-pay

## 2018-04-06 DIAGNOSIS — F17210 Nicotine dependence, cigarettes, uncomplicated: Secondary | ICD-10-CM | POA: Diagnosis not present

## 2018-04-06 DIAGNOSIS — R05 Cough: Secondary | ICD-10-CM | POA: Diagnosis not present

## 2018-04-06 DIAGNOSIS — J029 Acute pharyngitis, unspecified: Secondary | ICD-10-CM | POA: Insufficient documentation

## 2018-04-06 DIAGNOSIS — M7918 Myalgia, other site: Secondary | ICD-10-CM | POA: Diagnosis not present

## 2018-04-06 DIAGNOSIS — R112 Nausea with vomiting, unspecified: Secondary | ICD-10-CM | POA: Diagnosis not present

## 2018-04-06 DIAGNOSIS — R6889 Other general symptoms and signs: Secondary | ICD-10-CM

## 2018-04-06 LAB — URINALYSIS, MICROSCOPIC (REFLEX)

## 2018-04-06 LAB — CBC WITH DIFFERENTIAL/PLATELET
Abs Immature Granulocytes: 0.01 10*3/uL (ref 0.00–0.07)
BASOS PCT: 1 %
Basophils Absolute: 0 10*3/uL (ref 0.0–0.1)
Eosinophils Absolute: 0.1 10*3/uL (ref 0.0–0.5)
Eosinophils Relative: 2 %
HCT: 39 % (ref 36.0–46.0)
Hemoglobin: 11.9 g/dL — ABNORMAL LOW (ref 12.0–15.0)
Immature Granulocytes: 0 %
Lymphocytes Relative: 39 %
Lymphs Abs: 1.4 10*3/uL (ref 0.7–4.0)
MCH: 24.5 pg — ABNORMAL LOW (ref 26.0–34.0)
MCHC: 30.5 g/dL (ref 30.0–36.0)
MCV: 80.4 fL (ref 80.0–100.0)
Monocytes Absolute: 0.6 10*3/uL (ref 0.1–1.0)
Monocytes Relative: 16 %
Neutro Abs: 1.6 10*3/uL — ABNORMAL LOW (ref 1.7–7.7)
Neutrophils Relative %: 42 %
PLATELETS: 322 10*3/uL (ref 150–400)
RBC: 4.85 MIL/uL (ref 3.87–5.11)
RDW: 18.6 % — ABNORMAL HIGH (ref 11.5–15.5)
WBC: 3.7 10*3/uL — ABNORMAL LOW (ref 4.0–10.5)
nRBC: 0 % (ref 0.0–0.2)

## 2018-04-06 LAB — URINALYSIS, ROUTINE W REFLEX MICROSCOPIC
Glucose, UA: NEGATIVE mg/dL
Ketones, ur: >80 mg/dL — AB
Nitrite: NEGATIVE
Protein, ur: 30 mg/dL — AB
Specific Gravity, Urine: >1.03 — ABNORMAL HIGH (ref 1.005–1.030)
pH: 6 (ref 5.0–8.0)

## 2018-04-06 LAB — COMPREHENSIVE METABOLIC PANEL
ALBUMIN: 3.7 g/dL (ref 3.5–5.0)
ALT: 30 U/L (ref 0–44)
AST: 37 U/L (ref 15–41)
Alkaline Phosphatase: 34 U/L — ABNORMAL LOW (ref 38–126)
Anion gap: 13 (ref 5–15)
BUN: <5 mg/dL — ABNORMAL LOW (ref 6–20)
CO2: 21 mmol/L — ABNORMAL LOW (ref 22–32)
Calcium: 8.6 mg/dL — ABNORMAL LOW (ref 8.9–10.3)
Chloride: 104 mmol/L (ref 98–111)
Creatinine, Ser: 0.64 mg/dL (ref 0.44–1.00)
GFR calc Af Amer: >60 mL/min (ref >60)
GFR calc non Af Amer: >60 mL/min (ref >60)
Glucose, Bld: 105 mg/dL — ABNORMAL HIGH (ref 70–99)
Potassium: 3.3 mmol/L — ABNORMAL LOW (ref 3.5–5.1)
Sodium: 138 mmol/L (ref 135–145)
Total Bilirubin: 0.6 mg/dL (ref 0.3–1.2)
Total Protein: 7.4 g/dL (ref 6.5–8.1)

## 2018-04-06 LAB — INFLUENZA PANEL BY PCR (TYPE A & B)
INFLAPCR: NEGATIVE
Influenza B By PCR: POSITIVE — AB

## 2018-04-06 LAB — I-STAT BETA HCG BLOOD, ED (MC, WL, AP ONLY): I-stat hCG, quantitative: <5 m[IU]/mL (ref <5)

## 2018-04-06 LAB — LIPASE, BLOOD: Lipase: 23 U/L (ref 11–51)

## 2018-04-06 MED ORDER — ONDANSETRON 4 MG PO TBDP
4.0000 mg | ORAL_TABLET | Freq: Once | ORAL | Status: AC
  Administered 2018-04-06: 4 mg via ORAL
  Filled 2018-04-06: qty 1

## 2018-04-06 MED ORDER — ONDANSETRON HCL 4 MG PO TABS: 4.0000 mg | ORAL_TABLET | Freq: Four times a day (QID) | ORAL | 0 refills | Status: DC | PRN

## 2018-04-06 NOTE — ED Provider Notes (Signed)
[facility] Provider [facility]   CSN: [id number] Arrival date & time: 04/06/18  [id number]     History   Chief Complaint Chief Complaint  Patient presents with  . Cough  . Emesis  . Nausea    HPI  Sandra Mcknight is a 37 y.o. female who present complaining of flu-like symptoms: fevers, chills, myalgias, congestion, sore throat and cough for 3 days. Denies dyspnea or wheezing. She has had associated nausea and NBNB vomitus. She had multiple episodes of loose stool yesterday without abdominal pain. Her 2 y/o son was diagnosed with influenza last week.   HPI  Past Medical History:  Diagnosis Date  . Gestational diabetes   . Meningitis   . Obesity (BMI 35.0-39.9 without comorbidity)     Patient Active Problem List   Diagnosis Date Noted  . Muscle spasm 03/31/2016  . PROM (premature rupture of membranes) 02/19/2016  . GBS (group B Streptococcus carrier), +RV culture, currently pregnant 02/12/2016  . Migraine 10/04/2015  . Gestational diabetes mellitus (GDM) affecting pregnancy, antepartum 09/23/2015  . Rubella non-immune status, antepartum 09/19/2015  . Obesity 03/31/2011    Past Surgical History:  Procedure Laterality Date  . NO PAST SURGERIES       OB History    Gravida  3   Para  3   Term  3   Preterm      AB      Living  3     SAB      TAB      Ectopic      Multiple  0   Live Births  3            Home Medications    Prior to Admission medications   Medication Sig Start Date End Date Taking? Authorizing Provider  acetaminophen (TYLENOL) 500 MG tablet Take 1,000 mg by mouth every 6 (six) hours as needed for moderate pain or headache.    [provider]  doxycycline (VIBRAMYCIN) 100 MG capsule Take 1 capsule (100 mg total) by mouth 2 (two) times daily. Patient not taking: Reported on 06/05/2017 06/03/17   Bethel BornGekas, Kelly Marie, PA-C  guaiFENesin (MUCINEX) 600 MG 12 hr tablet Take 600 mg by mouth 2  (two) times daily as needed for cough or to loosen phlegm.    [provider]  ondansetron (ZOFRAN ODT) 4 MG disintegrating tablet Take 1 tablet (4 mg total) by mouth every 8 (eight) hours as needed for nausea or vomiting. Patient not taking: Reported on 06/05/2017 06/03/17   Bethel BornGekas, Kelly Marie, PA-C  predniSONE (DELTASONE) 20 MG tablet Take 1 tablet (20 mg total) by mouth 2 (two) times daily. 06/05/17   Mancel BaleWentz, Elliott, MD  Pseudoephedrine-APAP-DM (DAYQUIL PO) Take 15 mLs by mouth daily as needed (cold).    [provider]    Family History Family History  Problem Relation Age of Onset  . Diabetes Mother     Social History Social History   Tobacco Use  . Smoking status: Light Tobacco Smoker    Types: Cigarettes    Last attempt to quit: 06/23/2015    Years since quitting: 2.7  . Smokeless tobacco: Former Engineer, waterUser  Substance Use Topics  . Alcohol use: Yes    Comment: socially  . Drug use: No     Allergies   Patient has no known allergies.   Review of Systems Review of Systems Ten systems reviewed and are negative for acute change, except as noted in  the HPI.    Physical Exam Updated Vital Signs BP 115/84 (BP Location: Right Arm)   Pulse 94   Temp 98.6 F (37 C) (Oral)   Resp 20   Ht 5\' 2"  (1.575 m)   Wt 97.5 kg   LMP 03/21/2018   SpO2 99%   BMI 39.32 kg/m   Physical Exam Constitutional:      Appearance: She is obese. She is ill-appearing. She is not toxic-appearing.  HENT:     Head: Normocephalic and atraumatic.     Right Ear: Tympanic membrane normal.     Left Ear: Tympanic membrane normal.     Nose: Congestion present.     Mouth/Throat:     Mouth: Mucous membranes are moist.     Pharynx: No oropharyngeal exudate or posterior oropharyngeal erythema.  Eyes:     Extraocular Movements: Extraocular movements intact.     Pupils: Pupils are equal, round, and reactive to light.  Neck:     Musculoskeletal: Normal range of motion and neck supple.    Cardiovascular:     Rate and Rhythm: Normal rate and regular rhythm.     Heart sounds: No murmur. No friction rub. No gallop.   Pulmonary:     Effort: Pulmonary effort is normal.     Breath sounds: No wheezing or rhonchi.  Abdominal:     General: Bowel sounds are normal.     Palpations: Abdomen is soft.     Tenderness: There is no abdominal tenderness. There is no guarding.  Musculoskeletal: Normal range of motion.  Lymphadenopathy:     Cervical: No cervical adenopathy.  Skin:    General: Skin is warm and dry.     Capillary Refill: Capillary refill takes less than 2 seconds.  Neurological:     General: No focal deficit present.     Mental Status: She is alert and oriented to person, place, and time.  Psychiatric:        Mood and Affect: Mood normal.      ED Treatments / Results  Labs (all labs ordered are listed, but only abnormal results are displayed) Labs Reviewed  COMPREHENSIVE METABOLIC PANEL  CBC WITH DIFFERENTIAL/PLATELET  LIPASE, BLOOD  URINALYSIS, ROUTINE W REFLEX MICROSCOPIC  INFLUENZA PANEL BY PCR (TYPE A & B)  I-STAT BETA HCG BLOOD, ED ([facility], [facility], [facility] ONLY)    EKG None  Radiology No results found.  Procedures Procedures (including critical care time)  Medications Ordered in ED Medications  ondansetron (ZOFRAN-ODT) disintegrating tablet 4 mg (4 mg Oral Given 04/06/18 0912)     Initial Impression / Assessment and Plan / ED Course  I have reviewed the triage vital signs and the nursing notes.  Pertinent labs & imaging results that were available during my care of the patient were reviewed by me and considered in my medical decision making (see chart for details).    This is a 37 year old female who presents with flulike symptoms.  The patient is 3 days into her symptom profile.  Her white blood cell count is somewhat low however she is afebrile.  Patient blood pressure is stable here in the emergency department without hypotension.  Urine reflects mild  contamination and she has elevated ketones suggestive of poor intake.  Her lipase is normal, no elevation in her liver enzymes.  Potassium is slightly low which is probably also from vomiting and poor intake.  She has had no active vomiting here in the emergency department.  She has no positive pregnancy.  Her flu panel is still pending.  She is out of the window for treatment with antiviral medications.  Discussed supportive care and the patient was given strict term precautions.  She is tolerating p.o. fluids and appears appropriate for discharge at this time.   Final Clinical Impressions(s) / ED Diagnoses   Final diagnoses:  None    ED Discharge Orders    None       Arthor CaptainHarris, Maragret Vanacker, PA-C 04/06/18 1137    Alvira MondaySchlossman, Erin, MD 04/07/18 [phone]

## 2018-04-06 NOTE — Discharge Instructions (Addendum)
Make sure that you are staying well hydrated.  Try to eat small amounts of food such as crackers, soup/ soup broth. Take an over the counter flu medication such as DayQuil/ NyQuil  and you may also take motrin in between doses for fever control and body aches.  Your flu screen is still pending.  Return to the emergency department for the following reasons. Get help right away if: You develop shortness of breath or difficulty breathing. Your skin or nails turn a bluish color. You have severe pain or stiffness in your neck. You develop a sudden headache or sudden pain in your face or ear. You cannot eat or drink without vomiting.

## 2018-04-06 NOTE — ED Triage Notes (Signed)
Pt. Stated, Im pretty sure I hafve the flu, since my baby has had the flu. Ive not thrown up today. But it seems when I eat I throw up. Ive also had a cough. This started 3 days ago.

## 2018-04-06 NOTE — ED Notes (Signed)
Patient verbalized understanding of discharge instructions and denies any further needs or questions at this time. VS stable. Patient ambulatory with steady gait.  

## 2019-08-15 ENCOUNTER — Other Ambulatory Visit: Payer: Self-pay

## 2019-08-15 ENCOUNTER — Ambulatory Visit (HOSPITAL_COMMUNITY)
Admission: EM | Admit: 2019-08-15 | Discharge: 2019-08-15 | Disposition: A | Discharge status: Home / Self Care | Payer: Medicaid Other | Attending: Family Medicine | Admitting: Family Medicine

## 2019-08-15 ENCOUNTER — Encounter (HOSPITAL_COMMUNITY): Payer: Self-pay

## 2019-08-15 DIAGNOSIS — R112 Nausea with vomiting, unspecified: Secondary | ICD-10-CM

## 2019-08-15 DIAGNOSIS — Z3202 Encounter for pregnancy test, result negative: Secondary | ICD-10-CM

## 2019-08-15 DIAGNOSIS — K602 Anal fissure, unspecified: Secondary | ICD-10-CM | POA: Diagnosis not present

## 2019-08-15 LAB — POCT URINALYSIS DIP (DEVICE)
Glucose, UA: NEGATIVE mg/dL
Nitrite: NEGATIVE
Protein, ur: 100 mg/dL — AB
Specific Gravity, Urine: 1.015 (ref 1.005–1.030)
Urobilinogen, UA: 0.2 mg/dL (ref 0.0–1.0)
pH: 7 (ref 5.0–8.0)

## 2019-08-15 LAB — CBG MONITORING, ED: Glucose-Capillary: 92 mg/dL (ref 70–99)

## 2019-08-15 LAB — POC URINE PREG, ED: Preg Test, Ur: NEGATIVE

## 2019-08-15 MED ORDER — OMEPRAZOLE 20 MG PO CPDR: 20.0000 mg | DELAYED_RELEASE_CAPSULE | Freq: Every day | ORAL | 0 refills | Status: DC

## 2019-08-15 MED ORDER — POLYETHYLENE GLYCOL 3350 17 G PO PACK: 17.0000 g | PACK | Freq: Every day | ORAL | 0 refills | Status: DC

## 2019-08-15 MED ORDER — ONDANSETRON HCL 8 MG PO TABS: 8.0000 mg | ORAL_TABLET | Freq: Two times a day (BID) | ORAL | 0 refills | Status: DC

## 2019-08-15 MED ORDER — HYDROCORTISONE (PERIANAL) 2.5 % EX CREA: 1.0000 "application " | TOPICAL_CREAM | Freq: Three times a day (TID) | CUTANEOUS | 0 refills | Status: DC

## 2019-08-15 NOTE — ED Provider Notes (Signed)
[name]    CSN: [id number] Arrival date & time: 08/15/19  5053      History   Chief Complaint Chief Complaint  Patient presents with  . anal bleeding    HPI Sandra Mcknight is a 39 y.o. female.   HPI  Patient is here for 2 medical problems First she has rectal bleeding.  Present for 2 days.  Painless.  She states that it continues to slowly lose.  She is wearing a panty liner.  She has not had constipation.  She did have some loose bowel movements.  She is never had this problem before.  It has not been treated. Patient also has some bleeding because it is the first day of her menstrual. Her second problem is nausea.  She states she is had nausea and vomiting for 2 months.  She states she is hardly been able to eat.  When I ask if she is lost weight she states "I think so".  She does not weigh herself.  She states that the nausea is present intermittently all day long.  She states anytime she eats or drinks anything she feels the urge to throw up.  Often she does vomit.  She states that she might be dehydrated.  She has not taken any medication.  She states she is certain that she is not pregnant. She had diabetes during her third pregnancy.  Baby delivered in 2017.  She has not had her sugars checked since that time.  Denies polydipsia or urinary frequency. Denies any other GI symptoms.  Abdominal pain diarrhea.  She states that she does not have any pre-existing stomach problems such as ulcers, GERD, gallbladder disease.  Past Medical History:  Diagnosis Date  . Gestational diabetes   . Meningitis   . Obesity (BMI 35.0-39.9 without comorbidity)     Patient Active Problem List   Diagnosis Date Noted  . Muscle spasm 03/31/2016  . PROM (premature rupture of membranes) 02/19/2016  . GBS (group B Streptococcus carrier), +RV culture, currently pregnant 02/12/2016  . Migraine 10/04/2015  . Gestational diabetes mellitus (GDM) affecting pregnancy, antepartum  09/23/2015  . Rubella non-immune status, antepartum 09/19/2015  . Obesity 03/31/2011    Past Surgical History:  Procedure Laterality Date  . NO PAST SURGERIES      OB History    Gravida  3   Para  3   Term  3   Preterm      AB      Living  3     SAB      TAB      Ectopic      Multiple  0   Live Births  3            Home Medications    Prior to Admission medications   Medication Sig Start Date End Date Taking? Authorizing Provider  acetaminophen (TYLENOL) 500 MG tablet Take 1,000 mg by mouth every 6 (six) hours as needed for moderate pain or headache.    [provider]  hydrocortisone (ANUSOL-HC) 2.5 % rectal cream Place 1 application rectally 3 (three) times daily. 08/15/19   Raylene Everts, MD  omeprazole (PRILOSEC) 20 MG capsule Take 1 capsule (20 mg total) by mouth daily. 08/15/19   Raylene Everts, MD  ondansetron (ZOFRAN) 8 MG tablet Take 1 tablet (8 mg total) by mouth 2 (two) times daily. 08/15/19   Raylene Everts, MD  polyethylene glycol (MIRALAX) 17 g packet Take 17  g by mouth daily. 08/15/19   Eustace Moore, MD    Family History Family History  Problem Relation Age of Onset  . Diabetes Mother     Social History Social History   Tobacco Use  . Smoking status: Light Tobacco Smoker    Types: Cigarettes    Last attempt to quit: 06/23/2015    Years since quitting: 4.1  . Smokeless tobacco: Former Engineer, water Use Topics  . Alcohol use: Yes    Comment: socially  . Drug use: No     Allergies   Patient has no known allergies.   Review of Systems Review of Systems  Constitutional: Negative for activity change, appetite change, chills, fatigue, fever and unexpected weight change.  Gastrointestinal: Positive for anal bleeding, nausea and vomiting. Negative for abdominal pain, constipation and diarrhea.  Endocrine: Negative for polydipsia and polyuria.  Genitourinary: Negative for dysuria and frequency.      Physical Exam Triage Vital Signs ED Triage Vitals  Enc Vitals Group     BP 08/15/19 0945 123/88     Pulse Rate 08/15/19 0945 (!) 101     Resp 08/15/19 0945 18     Temp 08/15/19 0945 97.9 F (36.6 C)     Temp Source 08/15/19 0945 Oral     SpO2 08/15/19 0945 96 %     Weight 08/15/19 0946 200 lb (90.7 kg)     Height 08/15/19 0946 5\' 2"  (1.575 m)     Head Circumference --      Peak Flow --      Pain Score 08/15/19 0946 5     Pain Loc --      Pain Edu? --      Excl. in GC? --    No data found.  Updated Vital Signs BP 123/88   Pulse (!) 101   Temp 97.9 F (36.6 C) (Oral)   Resp 18   Ht 5\' 2"  (1.575 m)   Wt 90.7 kg   SpO2 96%   BMI 36.58 kg/m     Physical Exam Constitutional:      General: She is not in acute distress.    Appearance: She is well-developed.     Comments: Overweight.  No acute distress  HENT:     Head: Normocephalic and atraumatic.     Nose:     Comments: Mask is in place Eyes:     Conjunctiva/sclera: Conjunctivae normal.     Pupils: Pupils are equal, round, and reactive to light.  Cardiovascular:     Rate and Rhythm: Normal rate.  Pulmonary:     Effort: Pulmonary effort is normal. No respiratory distress.  Abdominal:     General: There is no distension.     Palpations: Abdomen is soft. There is no mass.     Tenderness: There is no abdominal tenderness. There is no guarding or rebound.     Comments: Rounded abdomen.  Normal bowel sounds.  Diffusely tender epigastrium with increased tenderness periumbilical.  No guarding rebound.  No organomegaly  Genitourinary:    Comments: Rectal is nontender.  Slight tear seen at the posterior edge Musculoskeletal:        General: Normal range of motion.     Cervical back: Normal range of motion.  Skin:    General: Skin is warm and dry.  Neurological:     General: No focal deficit present.     Mental Status: She is alert.  Psychiatric:  Mood and Affect: Mood normal.        Behavior: Behavior  normal.      UC Treatments / Results  Labs (all labs ordered are listed, but only abnormal results are displayed) Labs Reviewed  POCT URINALYSIS DIP (DEVICE) - Abnormal; Notable for the following components:      Result Value   Bilirubin Urine SMALL (*)    Ketones, ur TRACE (*)    Hgb urine dipstick LARGE (*)    Protein, ur 100 (*)    Leukocytes,Ua TRACE (*)    All other components within normal limits  POC URINE PREG, ED  CBG MONITORING, ED  Pregnancy test is negative Urinalysis with hematuria due to menses  EKG   Radiology No results found.  Procedures Procedures (including critical care time)  Medications Ordered in UC Medications - No data to display  Initial Impression / Assessment and Plan / UC Course  I have reviewed the triage vital signs and the nursing notes.  Pertinent labs & imaging results that were available during my care of the patient were reviewed by me and considered in my medical decision making (see chart for details).     Reviewed treatment of anal fissure with patient. Will start patient on omeprazole for her stomach pain and discomfort.  Hopefully this helps with nausea.  We will give her Zofran for the nausea.  She is to start with fluids and bland diet.  Advance as tolerated. Needs a primary care doctor.  Referrals given Final Clinical Impressions(s) / UC Diagnoses   Final diagnoses:  Anal fissure, unspecified  Intractable vomiting with nausea, unspecified vomiting type     Discharge Instructions     Take the miralax every day This helps to regulate the bowels Use Anusol HC cream 2-3 times a day  Take the Zofran twice a day for nausea.  This should allow you to eat and drink more normally. Take omeprazole once a day.  This helps to reduce the stomach acid Make an appointment with your primary care doctor.  You should see them in follow-up in 1 to 2 weeks   ED Prescriptions    Medication Sig Dispense Auth. Provider    polyethylene glycol (MIRALAX) 17 g packet Take 17 g by mouth daily. 14 each Eustace Moore, MD   hydrocortisone (ANUSOL-HC) 2.5 % rectal cream Place 1 application rectally 3 (three) times daily. 30 g Eustace Moore, MD   omeprazole (PRILOSEC) 20 MG capsule Take 1 capsule (20 mg total) by mouth daily. 30 capsule Eustace Moore, MD   ondansetron (ZOFRAN) 8 MG tablet Take 1 tablet (8 mg total) by mouth 2 (two) times daily. 20 tablet Eustace Moore, MD     PDMP not reviewed this encounter.   Eustace Moore, MD 08/15/19 1504

## 2019-08-15 NOTE — ED Triage Notes (Signed)
Pt c/o rectal bleedingx2 days. N/Vx33mos. Pt denies blood in stool, she states it's her actual rectum that's bleeding.

## 2019-08-15 NOTE — Discharge Instructions (Signed)
Take the miralax every day This helps to regulate the bowels Use Anusol HC cream 2-3 times a day  Take the Zofran twice a day for nausea.  This should allow you to eat and drink more normally. Take omeprazole once a day.  This helps to reduce the stomach acid Make an appointment with your primary care doctor.  You should see them in follow-up in 1 to 2 weeks

## 2020-09-17 ENCOUNTER — Ambulatory Visit (HOSPITAL_COMMUNITY)
Admission: EM | Admit: 2020-09-17 | Discharge: 2020-09-17 | Disposition: A | Discharge status: Home / Self Care | Payer: Medicaid Other | Attending: Internal Medicine | Admitting: Internal Medicine

## 2020-09-17 ENCOUNTER — Encounter (HOSPITAL_COMMUNITY): Payer: Self-pay | Admitting: Emergency Medicine

## 2020-09-17 ENCOUNTER — Other Ambulatory Visit: Payer: Self-pay

## 2020-09-17 DIAGNOSIS — Z20822 Contact with and (suspected) exposure to covid-19: Secondary | ICD-10-CM | POA: Diagnosis not present

## 2020-09-17 DIAGNOSIS — F17210 Nicotine dependence, cigarettes, uncomplicated: Secondary | ICD-10-CM | POA: Insufficient documentation

## 2020-09-17 DIAGNOSIS — J069 Acute upper respiratory infection, unspecified: Secondary | ICD-10-CM | POA: Insufficient documentation

## 2020-09-17 MED ORDER — PROMETHAZINE-PHENYLEPHRINE 6.25-5 MG/5ML PO SYRP: 5.0000 mL | ORAL_SOLUTION | ORAL | 0 refills | Status: DC | PRN

## 2020-09-17 MED ORDER — IBUPROFEN 600 MG PO TABS: 600.0000 mg | ORAL_TABLET | Freq: Three times a day (TID) | ORAL | 0 refills | Status: DC | PRN

## 2020-09-17 NOTE — Discharge Instructions (Signed)
Please increase oral fluid intake Take medications as directed We will call you with recommendations if labs are abnormal.

## 2020-09-17 NOTE — ED Provider Notes (Signed)
[facility]    CSN: [id number] Arrival date & time: 09/17/20  1232      History   Chief Complaint Chief Complaint  Patient presents with  . Headache  . Generalized Body Aches  . Chest Congestion    HPI Sandra Mcknight is a 40 y.o. female comes to the urgent care with a 2-day history of headaches, generalized body aches and chest congestion.  Symptoms started insidiously and has been persistent.  No sick contacts.  Patient complains of nonproductive cough.  No shortness of breath.  No wheezing or chest tightness.  Patient has had the first dose of the COVID-19 vaccine.Marland Kitchen   HPI  Past Medical History:  Diagnosis Date  . Gestational diabetes   . Meningitis   . Obesity (BMI 35.0-39.9 without comorbidity)     Patient Active Problem List   Diagnosis Date Noted  . Muscle spasm 03/31/2016  . PROM (premature rupture of membranes) 02/19/2016  . GBS (group B Streptococcus carrier), +RV culture, currently pregnant 02/12/2016  . Migraine 10/04/2015  . Gestational diabetes mellitus (GDM) affecting pregnancy, antepartum 09/23/2015  . Rubella non-immune status, antepartum 09/19/2015  . Obesity 03/31/2011    Past Surgical History:  Procedure Laterality Date  . NO PAST SURGERIES      OB History    Gravida  3   Para  3   Term  3   Preterm      AB      Living  3     SAB      IAB      Ectopic      Multiple  0   Live Births  3            Home Medications    Prior to Admission medications   Medication Sig Start Date End Date Taking? Authorizing Provider  ibuprofen (ADVIL) 600 MG tablet Take 1 tablet (600 mg total) by mouth every 8 (eight) hours as needed. 09/17/20  Yes Avi Archuleta, Britta Mccreedy, MD  promethazine-phenylephrine (PROMETHAZINE VC) 6.25-5 MG/5ML SYRP Take 5 mLs by mouth every 4 (four) hours as needed for congestion. 09/17/20  Yes Zina Pitzer, Britta Mccreedy, MD  acetaminophen (TYLENOL) 500 MG tablet Take 1,000 mg by mouth every 6 (six) hours as needed for  moderate pain or headache.    [provider]  hydrocortisone (ANUSOL-HC) 2.5 % rectal cream Place 1 application rectally 3 (three) times daily. 08/15/19   Eustace Moore, MD  omeprazole (PRILOSEC) 20 MG capsule Take 1 capsule (20 mg total) by mouth daily. 08/15/19   Eustace Moore, MD  ondansetron (ZOFRAN) 8 MG tablet Take 1 tablet (8 mg total) by mouth 2 (two) times daily. 08/15/19   Eustace Moore, MD  polyethylene glycol (MIRALAX) 17 g packet Take 17 g by mouth daily. 08/15/19   Eustace Moore, MD    Family History Family History  Problem Relation Age of Onset  . Diabetes Mother     Social History Social History   Tobacco Use  . Smoking status: Light Tobacco Smoker    Types: Cigarettes    Last attempt to quit: 06/23/2015    Years since quitting: 5.2  . Smokeless tobacco: Former Clinical biochemist  . Vaping Use: Never used  Substance Use Topics  . Alcohol use: Yes    Comment: socially  . Drug use: No     Allergies   Patient has no known allergies.   Review of Systems Review of Systems  Constitutional: Positive for fatigue. Negative for chills and fever.  HENT: Positive for congestion.   Respiratory: Positive for cough. Negative for shortness of breath and wheezing.   Cardiovascular: Negative for chest pain.  Musculoskeletal: Positive for myalgias. Negative for neck pain and neck stiffness.  Skin: Negative.   Neurological: Positive for headaches.     Physical Exam Triage Vital Signs ED Triage Vitals  Enc Vitals Group     BP 09/17/20 1317 121/65     Pulse Rate 09/17/20 1317 81     Resp 09/17/20 1317 17     Temp 09/17/20 1317 98.9 F (37.2 C)     Temp Source 09/17/20 1317 Oral     SpO2 09/17/20 1317 98 %     Weight --      Height --      Head Circumference --      Peak Flow --      Pain Score 09/17/20 1316 8     Pain Loc --      Pain Edu? --      Excl. in GC? --    No data found.  Updated Vital Signs BP 121/65 (BP Location: Right  Arm)   Pulse 81   Temp 98.9 F (37.2 C) (Oral)   Resp 17   LMP 08/21/2020   SpO2 98%   Visual Acuity Right Eye Distance:   Left Eye Distance:   Bilateral Distance:    Right Eye Near:   Left Eye Near:    Bilateral Near:     Physical Exam Vitals reviewed.  Constitutional:      General: She is not in acute distress.    Appearance: She is ill-appearing.  Cardiovascular:     Rate and Rhythm: Normal rate and regular rhythm.  Pulmonary:     Effort: Pulmonary effort is normal.     Breath sounds: Normal breath sounds.  Abdominal:     Palpations: Abdomen is soft.  Skin:    General: Skin is warm.  Neurological:     Mental Status: She is alert.     GCS: GCS eye subscore is 4. GCS verbal subscore is 5. GCS motor subscore is 6.      UC Treatments / Results  Labs (all labs ordered are listed, but only abnormal results are displayed) Labs Reviewed  SARS CORONAVIRUS 2 (TAT 6-24 HRS)    EKG   Radiology No results found.  Procedures Procedures (including critical care time)  Medications Ordered in UC Medications - No data to display  Initial Impression / Assessment and Plan / UC Course  I have reviewed the triage vital signs and the nursing notes.  Pertinent labs & imaging results that were available during my care of the patient were reviewed by me and considered in my medical decision making (see chart for details).    1.  Viral respiratory infection with cough: COVID-19 PCR test has been sent Increase oral fluid intake Promethazine VC as needed for cough Ibuprofen as needed for generalized body aches Please quarantine until COVID-19 test results are available We will call patient with recommendations if labs are abnormal. Return to urgent care if symptoms are worsening. Final Clinical Impressions(s) / UC Diagnoses   Final diagnoses:  Viral upper respiratory tract infection with cough     Discharge Instructions     Please increase oral fluid intake Take  medications as directed We will call you with recommendations if labs are abnormal.   ED Prescriptions    Medication  Sig Dispense Auth. Provider   promethazine-phenylephrine (PROMETHAZINE VC) 6.25-5 MG/5ML SYRP Take 5 mLs by mouth every 4 (four) hours as needed for congestion. 118 mL Gurnie Duris, Britta Mccreedy, MD   ibuprofen (ADVIL) 600 MG tablet Take 1 tablet (600 mg total) by mouth every 8 (eight) hours as needed. 30 tablet Laiza Veenstra, Britta Mccreedy, MD     PDMP not reviewed this encounter.   Merrilee Jansky, MD 09/17/20 1745

## 2020-09-17 NOTE — ED Triage Notes (Signed)
Pt presents with headache, body aches, and congestion xs 2 days. States taking BC powders and tylenol with no relief.

## 2020-09-18 LAB — SARS CORONAVIRUS 2 (TAT 6-24 HRS): SARS Coronavirus 2: NEGATIVE

## 2021-01-03 ENCOUNTER — Ambulatory Visit (INDEPENDENT_AMBULATORY_CARE_PROVIDER_SITE_OTHER): Payer: Medicaid Other | Admitting: Primary Care

## 2021-03-29 ENCOUNTER — Ambulatory Visit (INDEPENDENT_AMBULATORY_CARE_PROVIDER_SITE_OTHER): Payer: Medicaid Other | Admitting: Primary Care

## 2021-04-25 ENCOUNTER — Emergency Department (HOSPITAL_COMMUNITY): Payer: Medicaid Other

## 2021-04-25 ENCOUNTER — Emergency Department (HOSPITAL_COMMUNITY)
Admission: EM | Admit: 2021-04-25 | Discharge: 2021-04-25 | Discharge status: Against Medical Advice | Payer: Medicaid Other | Attending: Student | Admitting: Student

## 2021-04-25 ENCOUNTER — Encounter (HOSPITAL_COMMUNITY): Payer: Self-pay | Admitting: Emergency Medicine

## 2021-04-25 DIAGNOSIS — N9489 Other specified conditions associated with female genital organs and menstrual cycle: Secondary | ICD-10-CM | POA: Diagnosis not present

## 2021-04-25 DIAGNOSIS — Z20822 Contact with and (suspected) exposure to covid-19: Secondary | ICD-10-CM | POA: Diagnosis not present

## 2021-04-25 DIAGNOSIS — G4489 Other headache syndrome: Secondary | ICD-10-CM | POA: Diagnosis not present

## 2021-04-25 DIAGNOSIS — R519 Headache, unspecified: Secondary | ICD-10-CM | POA: Insufficient documentation

## 2021-04-25 DIAGNOSIS — R6883 Chills (without fever): Secondary | ICD-10-CM | POA: Diagnosis not present

## 2021-04-25 DIAGNOSIS — M7918 Myalgia, other site: Secondary | ICD-10-CM | POA: Insufficient documentation

## 2021-04-25 DIAGNOSIS — R112 Nausea with vomiting, unspecified: Secondary | ICD-10-CM | POA: Diagnosis not present

## 2021-04-25 DIAGNOSIS — Z5321 Procedure and treatment not carried out due to patient leaving prior to being seen by health care provider: Secondary | ICD-10-CM | POA: Diagnosis not present

## 2021-04-25 LAB — BASIC METABOLIC PANEL
Anion gap: 9 (ref 5–15)
BUN: 5 mg/dL — ABNORMAL LOW (ref 6–20)
CO2: 19 mmol/L — ABNORMAL LOW (ref 22–32)
Calcium: 8.8 mg/dL — ABNORMAL LOW (ref 8.9–10.3)
Chloride: 107 mmol/L (ref 98–111)
Creatinine, Ser: 0.63 mg/dL (ref 0.44–1.00)
GFR, Estimated: >60 mL/min (ref >60)
Glucose, Bld: 99 mg/dL (ref 70–99)
Potassium: 3.7 mmol/L (ref 3.5–5.1)
Sodium: 135 mmol/L (ref 135–145)

## 2021-04-25 LAB — CBC WITH DIFFERENTIAL/PLATELET
Abs Immature Granulocytes: 0.02 10*3/uL (ref 0.00–0.07)
Basophils Absolute: 0 10*3/uL (ref 0.0–0.1)
Basophils Relative: 0 %
Eosinophils Absolute: 0.2 10*3/uL (ref 0.0–0.5)
Eosinophils Relative: 2 %
HCT: 38.1 % (ref 36.0–46.0)
Hemoglobin: 12.6 g/dL (ref 12.0–15.0)
Immature Granulocytes: 0 %
Lymphocytes Relative: 23 %
Lymphs Abs: 1.6 10*3/uL (ref 0.7–4.0)
MCH: 28.8 pg (ref 26.0–34.0)
MCHC: 33.1 g/dL (ref 30.0–36.0)
MCV: 87.2 fL (ref 80.0–100.0)
Monocytes Absolute: 0.4 10*3/uL (ref 0.1–1.0)
Monocytes Relative: 6 %
Neutro Abs: 4.9 10*3/uL (ref 1.7–7.7)
Neutrophils Relative %: 69 %
Platelets: 311 10*3/uL (ref 150–400)
RBC: 4.37 MIL/uL (ref 3.87–5.11)
RDW: 17.6 % — ABNORMAL HIGH (ref 11.5–15.5)
WBC: 7.2 10*3/uL (ref 4.0–10.5)
nRBC: 0 % (ref 0.0–0.2)

## 2021-04-25 LAB — RESP PANEL BY RT-PCR (FLU A&B, COVID) ARPGX2
Influenza A by PCR: NEGATIVE
Influenza B by PCR: NEGATIVE
SARS Coronavirus 2 by RT PCR: NEGATIVE

## 2021-04-25 NOTE — ED Notes (Signed)
Patient called x2 for vitals with no response 

## 2021-04-25 NOTE — ED Provider Triage Note (Signed)
Emergency Medicine Provider Triage Evaluation Note  Sandra Mcknight , a 41 y.o. female  was evaluated in triage.  Pt complains of headache.  Started a week ago, last night it changed in severity.  She started having nausea and multiple episodes of emesis.  Patient feels similar to when she had meningitis in her 66s and 48s.  She endorses chills but denies any fevers.  Also endorses associated body aches.  Review of Systems  Positive: ABOVE Negative: ABOVE  Physical Exam  BP 124/83 (BP Location: Right Arm)    Pulse 94    Temp 98.7 F (37.1 C) (Oral)    Resp 18    SpO2 99%  Gen:   Awake, no distress   Resp:  Normal effort  MSK:   Moves extremities without difficulty  Other:  Cranial nerves III through XII grossly intact, grip strength equal bilaterally  Medical Decision Making  Medically screening exam initiated at 11:01 AM.  Appropriate orders placed.  Sandra Mcknight was informed that the remainder of the evaluation will be completed by another provider, this initial triage assessment does not replace that evaluation, and the importance of remaining in the ED until their evaluation is complete.  CT, LABS, VIRAL PANEL   Sandra Raring, PA-C 04/25/21 1102

## 2021-04-25 NOTE — ED Triage Notes (Signed)
Patient with history of migraines and remote history of meningitis complains of headache for the last week that has gotten progressively worse over time. Patient states pain feels similar to when she was diagnosed with meningitis. Alert, oriented, and in no apparent distress at this time.  EMS vitals 146/76 HR 100 98% on room air CBG 82

## 2021-04-26 ENCOUNTER — Encounter (HOSPITAL_COMMUNITY): Payer: Self-pay

## 2021-04-26 ENCOUNTER — Ambulatory Visit (HOSPITAL_COMMUNITY)
Admission: EM | Admit: 2021-04-26 | Discharge: 2021-04-26 | Disposition: A | Discharge status: Home / Self Care | Payer: Medicaid Other | Attending: Emergency Medicine | Admitting: Emergency Medicine

## 2021-04-26 DIAGNOSIS — G43009 Migraine without aura, not intractable, without status migrainosus: Secondary | ICD-10-CM | POA: Diagnosis not present

## 2021-04-26 MED ORDER — KETOROLAC TROMETHAMINE 30 MG/ML IJ SOLN
INTRAMUSCULAR | Status: AC
  Filled 2021-04-26: qty 1

## 2021-04-26 MED ORDER — METHYLPREDNISOLONE SODIUM SUCC 125 MG IJ SOLR
INTRAMUSCULAR | Status: AC
  Filled 2021-04-26: qty 2

## 2021-04-26 MED ORDER — PREDNISONE 10 MG (21) PO TBPK: ORAL_TABLET | Freq: Every day | ORAL | 0 refills | Status: DC

## 2021-04-26 MED ORDER — KETOROLAC TROMETHAMINE 30 MG/ML IJ SOLN
30.0000 mg | Freq: Once | INTRAMUSCULAR | Status: AC
  Administered 2021-04-26: 30 mg via INTRAMUSCULAR

## 2021-04-26 MED ORDER — METHYLPREDNISOLONE SODIUM SUCC 125 MG IJ SOLR
80.0000 mg | Freq: Once | INTRAMUSCULAR | Status: AC
  Administered 2021-04-26: 80 mg via INTRAMUSCULAR

## 2021-04-26 NOTE — ED Triage Notes (Signed)
Patient presents with headache that started 2-3 days ago. She reports the headache feels like a migraines that will not go away. She has taken motrin and bc powder to help with her symptoms.

## 2021-04-26 NOTE — ED Provider Notes (Signed)
[facility]    CSN: [id number] Arrival date & time: 04/26/21  [id number]      History   Chief Complaint Chief Complaint  Patient presents with   Headache    Body aches     HPI Sandra Mcknight is a 41 y.o. female.   Patient presents for headache that has been going on for 3 days with slight body aches also.  Patient was seen in the emergency room yesterday had a head CT and labs completed.  Patient states that she was unable to wait past 6 hours for results and treatment.  Has been taking BC powders with no relief.  Patient has a chronic history of migraines and states this is very similar to hers in the past   Past Medical History:  Diagnosis Date   Gestational diabetes    Meningitis    Obesity (BMI 35.0-39.9 without comorbidity)     Patient Active Problem List   Diagnosis Date Noted   Muscle spasm 03/31/2016   PROM (premature rupture of membranes) 02/19/2016   GBS (group B Streptococcus carrier), +RV culture, currently pregnant 02/12/2016   Migraine 10/04/2015   Gestational diabetes mellitus (GDM) affecting pregnancy, antepartum 09/23/2015   Rubella non-immune status, antepartum 09/19/2015   Obesity 03/31/2011    Past Surgical History:  Procedure Laterality Date   NO PAST SURGERIES      OB History     Gravida  3   Para  3   Term  3   Preterm      AB      Living  3      SAB      IAB      Ectopic      Multiple  0   Live Births  3            Home Medications    Prior to Admission medications   Medication Sig Start Date End Date Taking? Authorizing Provider  predniSONE (STERAPRED UNI-PAK 21 TAB) 10 MG (21) TBPK tablet Take by mouth daily. Take 6 tabs by mouth daily  for 2 days, then 5 tabs for 2 days, then 4 tabs for 2 days, then 3 tabs for 2 days, 2 tabs for 2 days, then 1 tab by mouth daily for 2 days 04/26/21  Yes Coralyn MarkMitchell, Nole Robey L, NP  acetaminophen (TYLENOL) 500 MG tablet Take 1,000 mg by mouth every 6 (six) hours as needed  for moderate pain or headache.    [provider]  hydrocortisone (ANUSOL-HC) 2.5 % rectal cream Place 1 application rectally 3 (three) times daily. 08/15/19   Eustace MooreNelson, Yvonne Sue, MD  ibuprofen (ADVIL) 600 MG tablet Take 1 tablet (600 mg total) by mouth every 8 (eight) hours as needed. 09/17/20   Merrilee JanskyLamptey, Philip O, MD  omeprazole (PRILOSEC) 20 MG capsule Take 1 capsule (20 mg total) by mouth daily. 08/15/19   Eustace MooreNelson, Yvonne Sue, MD  ondansetron (ZOFRAN) 8 MG tablet Take 1 tablet (8 mg total) by mouth 2 (two) times daily. 08/15/19   Eustace MooreNelson, Yvonne Sue, MD  polyethylene glycol (MIRALAX) 17 g packet Take 17 g by mouth daily. 08/15/19   Eustace MooreNelson, Yvonne Sue, MD  promethazine-phenylephrine (PROMETHAZINE VC) 6.25-5 MG/5ML SYRP Take 5 mLs by mouth every 4 (four) hours as needed for congestion. 09/17/20   Lamptey, Britta MccreedyPhilip O, MD    Family History Family History  Problem Relation Age of Onset   Diabetes Mother     Social History Social History  Tobacco Use   Smoking status: Light Smoker    Types: Cigarettes    Last attempt to quit: 06/23/2015    Years since quitting: 5.8   Smokeless tobacco: Former  Building services engineer Use: Never used  Substance Use Topics   Alcohol use: Yes    Comment: socially   Drug use: No     Allergies   Patient has no known allergies.   Review of Systems Review of Systems  Constitutional:  Negative for chills, fatigue and fever.  HENT: Negative.    Respiratory: Negative.    Cardiovascular: Negative.   Gastrointestinal: Negative.   Genitourinary: Negative.   Neurological:  Positive for headaches. Negative for dizziness, syncope, facial asymmetry, weakness, light-headedness and numbness.    Physical Exam Triage Vital Signs ED Triage Vitals [04/26/21 0945]  Enc Vitals Group     BP 124/82     Pulse Rate 85     Resp 18     Temp 98.3 F (36.8 C)     Temp Source Oral     SpO2 100 %     Weight      Height      Head Circumference      Peak Flow       Pain Score 7     Pain Loc      Pain Edu?      Excl. in GC?    No data found.  Updated Vital Signs BP 124/82 (BP Location: Left Arm)    Pulse 85    Temp 98.3 F (36.8 C) (Oral)    Resp 18    LMP 04/24/2021 (Approximate)    SpO2 100%   Visual Acuity Right Eye Distance:   Left Eye Distance:   Bilateral Distance:    Right Eye Near:   Left Eye Near:    Bilateral Near:     Physical Exam Constitutional:      Appearance: She is obese.  HENT:     Head: Normocephalic.     Mouth/Throat:     Mouth: Mucous membranes are moist.  Eyes:     Extraocular Movements: Extraocular movements intact.  Pulmonary:     Effort: Pulmonary effort is normal.  Abdominal:     Palpations: Abdomen is soft.  Musculoskeletal:     Cervical back: Normal range of motion.  Skin:    General: Skin is warm.  Neurological:     Mental Status: She is alert. Mental status is at baseline.     GCS: GCS eye subscore is 4. GCS verbal subscore is 5. GCS motor subscore is 6.     UC Treatments / Results  Labs (all labs ordered are listed, but only abnormal results are displayed) Labs Reviewed - No data to display  EKG   Radiology CT Head Wo Contrast  Result Date: 04/25/2021 CLINICAL DATA:  Headache, chronic, new features or increased frequency EXAM: CT HEAD WITHOUT CONTRAST TECHNIQUE: Contiguous axial images were obtained from the base of the skull through the vertex without intravenous contrast. COMPARISON:  July 10, 2013. FINDINGS: Brain: No evidence of acute large vascular territory infarction, hemorrhage, hydrocephalus, extra-axial collection or mass lesion/mass effect. Vascular: No hyperdense vessel identified. Skull: No acute fracture. Sinuses/Orbits: Clear visualized sinuses.  Unremarkable orbits. Other: No mastoid effusions. Cerumen in bilateral external auditory canals. IMPRESSION: No evidence of acute intracranial abnormality. Electronically Signed   By: Feliberto Harts M.D.   On: 04/25/2021 12:26     Procedures Procedures (including critical care  time)  Medications Ordered in UC Medications  ketorolac (TORADOL) 30 MG/ML injection 30 mg (has no administration in time range)  methylPREDNISolone sodium succinate (SOLU-MEDROL) 125 mg/2 mL injection 80 mg (has no administration in time range)    Initial Impression / Assessment and Plan / UC Course  I have reviewed the triage vital signs and the nursing notes.  Pertinent labs & imaging results that were available during my care of the patient were reviewed by me and considered in my medical decision making (see chart for details).     Patient's labs and CT scan were normal And continue to take Tylenol Motrin as needed We will give some Solu-Medrol pain shot while she is here to help subside Stay hydrated drink plenty of fluids If symptoms become worse needs to be seen in the emergency room Final Clinical Impressions(s) / UC Diagnoses   Final diagnoses:  Migraine without aura and without status migrainosus, not intractable     Discharge Instructions      If pain persist you need to see neurology Take Tylenol Motrin as needed for pain     ED Prescriptions     Medication Sig Dispense Auth. Provider   predniSONE (STERAPRED UNI-PAK 21 TAB) 10 MG (21) TBPK tablet Take by mouth daily. Take 6 tabs by mouth daily  for 2 days, then 5 tabs for 2 days, then 4 tabs for 2 days, then 3 tabs for 2 days, 2 tabs for 2 days, then 1 tab by mouth daily for 2 days 42 tablet Coralyn Mark, NP      PDMP not reviewed this encounter.   Coralyn Mark, NP 04/26/21 1047

## 2021-04-26 NOTE — Discharge Instructions (Addendum)
If pain persist you need to see neurology Take Tylenol Motrin as needed for pain

## 2021-05-25 ENCOUNTER — Encounter (INDEPENDENT_AMBULATORY_CARE_PROVIDER_SITE_OTHER): Payer: Self-pay | Admitting: Primary Care

## 2021-05-25 ENCOUNTER — Other Ambulatory Visit: Payer: Self-pay

## 2021-05-25 ENCOUNTER — Ambulatory Visit (INDEPENDENT_AMBULATORY_CARE_PROVIDER_SITE_OTHER): Payer: Medicaid Other | Admitting: Primary Care

## 2021-05-25 VITALS — BP 136/94 | HR 89 | Temp 98.1°F | Ht 63.0 in | Wt 204.0 lb

## 2021-05-25 DIAGNOSIS — R030 Elevated blood-pressure reading, without diagnosis of hypertension: Secondary | ICD-10-CM

## 2021-05-25 DIAGNOSIS — Z720 Tobacco use: Secondary | ICD-10-CM | POA: Diagnosis not present

## 2021-05-25 DIAGNOSIS — Z23 Encounter for immunization: Secondary | ICD-10-CM | POA: Diagnosis not present

## 2021-05-25 DIAGNOSIS — Z131 Encounter for screening for diabetes mellitus: Secondary | ICD-10-CM

## 2021-05-25 DIAGNOSIS — H6123 Impacted cerumen, bilateral: Secondary | ICD-10-CM | POA: Diagnosis not present

## 2021-05-25 DIAGNOSIS — R7303 Prediabetes: Secondary | ICD-10-CM

## 2021-05-25 DIAGNOSIS — Z13220 Encounter for screening for lipoid disorders: Secondary | ICD-10-CM | POA: Diagnosis not present

## 2021-05-25 DIAGNOSIS — G43001 Migraine without aura, not intractable, with status migrainosus: Secondary | ICD-10-CM

## 2021-05-25 LAB — POCT GLYCOSYLATED HEMOGLOBIN (HGB A1C): Hemoglobin A1C: 6 % — AB (ref 4.0–5.6)

## 2021-05-25 NOTE — Progress Notes (Signed)
New Patient Office Visit  Subjective:  Patient ID: Sandra Mcknight, female    DOB: 1980/04/21  Age: 41 y.o. MRN: [id number]  CC:  Chief Complaint  Patient presents with   New Patient (Initial Visit)    HPI Sandra Mcknight is a 41 year old obese female who presents for establish care. Sandra Mcknight has complaints daily migraines- ( treatment of Bc powder for relief) . When her migraines 10/10 are they cause swelling behind her ears - and nothing can touch her head. Sandra Mcknight was followed by a neurologist in [place] and will schedule appt to continue to follow migraines. Sandra Mcknight also voices in the past couple of years has been experiencing  numbness in feet and fingers. Increased numbness at night when sleeping and wakes up up from her sleep.Denies shortness of breath, chest pain or lower extremity edema  Past Medical History:  Diagnosis Date   Gestational diabetes    Meningitis    Obesity (BMI 35.0-39.9 without comorbidity)     Past Surgical History:  Procedure Laterality Date   NO PAST SURGERIES      Family History  Problem Relation Age of Onset   Diabetes Mother     Social History   Socioeconomic History   Marital status: Single    Spouse name: Not on file   Number of children: Not on file   Years of education: Not on file   Highest education level: Not on file  Occupational History   Not on file  Tobacco Use   Smoking status: Light Smoker    Types: Cigarettes    Last attempt to quit: 06/23/2015    Years since quitting: 5.9   Smokeless tobacco: Former  Building services engineer Use: Never used  Substance and Sexual Activity   Alcohol use: Yes    Comment: socially   Drug use: No   Sexual activity: Yes    Birth control/protection: None  Other Topics Concern   Not on file  Social History Narrative   Not on file   Social Determinants of Health   Financial Resource Strain: Not on file  Food Insecurity: Not on file  Transportation Needs: Not on file  Physical Activity: Not  on file  Stress: Not on file  Social Connections: Not on file  Intimate Partner Violence: Not on file    ROS Comprehensive ROS Pertinent positive and negative noted in HPI    Objective:   Today's Vitals: BP (!) 136/94 (BP Location: Right Arm, Patient Position: Sitting, Cuff Size: Normal)    Pulse 89    Temp 98.1 F (36.7 C) (Oral)    Ht 5\' 3"  (1.6 m)    Wt 204 lb (92.5 kg)    LMP 05/20/2021 (Exact Date)    SpO2 95%    BMI 36.14 kg/m   Physical Exam Vitals reviewed.  Constitutional:      Appearance: Sandra Mcknight is obese.  HENT:     Head: Normocephalic.     Right Ear: Tympanic membrane normal. There is impacted cerumen.     Left Ear: Tympanic membrane normal. There is impacted cerumen.     Nose: Nose normal.  Eyes:     Extraocular Movements: Extraocular movements intact.     Pupils: Pupils are equal, round, and reactive to light.  Cardiovascular:     Rate and Rhythm: Normal rate and regular rhythm.  Pulmonary:     Effort: Pulmonary effort is normal.     Breath sounds: Normal breath sounds.  Abdominal:     General: Bowel sounds are normal. There is distension.     Palpations: Abdomen is soft.  Musculoskeletal:        General: Normal range of motion.     Cervical back: Normal range of motion and neck supple.  Skin:    General: Skin is warm and dry.  Neurological:     Mental Status: Sandra Mcknight is oriented to person, place, and time.  Psychiatric:        Mood and Affect: Mood normal.        Behavior: Behavior normal.        Thought Content: Thought content normal.        Judgment: Judgment normal.   Assessment & Plan:   Outpatient Encounter Medications as of 05/25/2021  Medication Sig   acetaminophen (TYLENOL) 500 MG tablet Take 1,000 mg by mouth every 6 (six) hours as needed for moderate pain or headache.   [DISCONTINUED] hydrocortisone (ANUSOL-HC) 2.5 % rectal cream Place 1 application rectally 3 (three) times daily.   [DISCONTINUED] ibuprofen (ADVIL) 600 MG tablet Take 1 tablet (600  mg total) by mouth every 8 (eight) hours as needed.   [DISCONTINUED] omeprazole (PRILOSEC) 20 MG capsule Take 1 capsule (20 mg total) by mouth daily.   [DISCONTINUED] ondansetron (ZOFRAN) 8 MG tablet Take 1 tablet (8 mg total) by mouth 2 (two) times daily.   [DISCONTINUED] polyethylene glycol (MIRALAX) 17 g packet Take 17 g by mouth daily.   [DISCONTINUED] predniSONE (STERAPRED UNI-PAK 21 TAB) 10 MG (21) TBPK tablet Take by mouth daily. Take 6 tabs by mouth daily  for 2 days, then 5 tabs for 2 days, then 4 tabs for 2 days, then 3 tabs for 2 days, 2 tabs for 2 days, then 1 tab by mouth daily for 2 days   [DISCONTINUED] promethazine-phenylephrine (PROMETHAZINE VC) 6.25-5 MG/5ML SYRP Take 5 mLs by mouth every 4 (four) hours as needed for congestion.   No facility-administered encounter medications on file as of 05/25/2021.  Paul was seen today for new patient (initial visit).  Diagnoses and all orders for this visit:  Screening for diabetes mellitus -     HgB A1c 6.0  Prediabetes Previously 5 years ago 6.0 . No medication lifestyle modifications .  Reduction in foods that are high in carbohydrates are the following rice, potatoes, breads, sugars, and pastas.  Reduction in the intake (eating) will assist in lowering your blood sugars.   Class 2 severe obesity due to excess calories with serious comorbidity in adult, unspecified BMI (HCC) Obesity is 30-39 indicating an excess in caloric intake or underlining conditions. This may lead to other co-morbidities. Lifestyle modifications of diet and exercise may reduce obesity.    Elevated blood pressure reading without diagnosis of hypertension BP goal - < 130/80 Explained that having normal blood pressure is the goal and medications are helping to get to goal and maintain normal blood pressure. DIET: Limit salt intake, read nutrition labels to check salt content, limit fried and high fatty foods  Avoid using multisymptom OTC cold preparations that  generally contain sudafed which can rise BP. Consult with pharmacist on best cold relief products to use for persons with HTN EXERCISE Discussed incorporating exercise such as walking - 30 minutes most days of the week and can do in 10 minute intervals     Migraine without aura and with status migrainosus, not intractable Patient to contact and schedule appt with neurologist who was previously managing migraines. Explained nicotine can  be a trigger, alcohol , cholate and caffeine.  Lipid screening -     Lipid Panel     Follow-up: Return for cerumen impaction bilateral .   Grayce Sessions, NP

## 2021-05-25 NOTE — Progress Notes (Signed)
Daily migraines- takes a Bc powder for relief Depending on how bad the migraines are they cause swelling behind her ears  Complains of numbness in feet and fingers

## 2021-05-25 NOTE — Patient Instructions (Signed)
Influenza, Adult °Influenza is also called "the flu." It is an infection in the lungs, nose, and throat (respiratory tract). It spreads easily from person to person (is contagious). The flu causes symptoms that are like a cold, along with high fever and body aches. °What are the causes? °This condition is caused by the influenza virus. You can get the virus by: °Breathing in droplets that are in the air after a person infected with the flu coughed or sneezed. °Touching something that has the virus on it and then touching your mouth, nose, or eyes. °What increases the risk? °Certain things may make you more likely to get the flu. These include: °Not washing your hands often. °Having close contact with many people during cold and flu season. °Touching your mouth, eyes, or nose without first washing your hands. °Not getting a flu shot every year. °You may have a higher risk for the flu, and serious problems, such as a lung infection (pneumonia), if you: °Are older than 65. °Are pregnant. °Have a weakened disease-fighting system (immune system) because of a disease or because you are taking certain medicines. °Have a long-term (chronic) condition, such as: °Heart, kidney, or lung disease. °Diabetes. °Asthma. °Have a liver disorder. °Are very overweight (morbidly obese). °Have anemia. °What are the signs or symptoms? °Symptoms usually begin suddenly and last 4-14 days. They may include: °Fever and chills. °Headaches, body aches, or muscle aches. °Sore throat. °Cough. °Runny or stuffy (congested) nose. °Feeling discomfort in your chest. °Not wanting to eat as much as normal. °Feeling weak or tired. °Feeling dizzy. °Feeling sick to your stomach or throwing up. °How is this treated? °If the flu is found early, you can be treated with antiviral medicine. This can help to reduce how bad the illness is and how long it lasts. This may be given by mouth or through an IV tube. °Taking care of yourself at home can help your  symptoms get better. Your doctor may want you to: °Take over-the-counter medicines. °Drink plenty of fluids. °The flu often goes away on its own. If you have very bad symptoms or other problems, you may be treated in a hospital. °Follow these instructions at home: °  °Activity °Rest as needed. Get plenty of sleep. °Stay home from work or school as told by your doctor. °Do not leave home until you do not have a fever for 24 hours without taking medicine. °Leave home only to go to your doctor. °Eating and drinking °Take an ORS (oral rehydration solution). This is a drink that is sold at pharmacies and stores. °Drink enough fluid to keep your pee pale yellow. °Drink clear fluids in small amounts as you are able. Clear fluids include: °Water. °Ice chips. °Fruit juice mixed with water. °Low-calorie sports drinks. °Eat bland foods that are easy to digest. Eat small amounts as you are able. These foods include: °Bananas. °Applesauce. °Rice. °Lean meats. °Toast. °Crackers. °Do not eat or drink: °Fluids that have a lot of sugar or caffeine. °Alcohol. °Spicy or fatty foods. °General instructions °Take over-the-counter and prescription medicines only as told by your doctor. °Use a cool mist humidifier to add moisture to the air in your home. This can make it easier for you to breathe. °When using a cool mist humidifier, clean it daily. Empty water and replace with clean water. °Cover your mouth and nose when you cough or sneeze. °Wash your hands with soap and water often and for at least 20 seconds. This is also important after   you cough or sneeze. If you cannot use soap and water, use alcohol-based hand sanitizer. °Keep all follow-up visits. °How is this prevented? ° °Get a flu shot every year. You may get the flu shot in late summer, fall, or winter. Ask your doctor when you should get your flu shot. °Avoid contact with people who are sick during fall and winter. This is cold and flu season. °Contact a doctor if: °You get  new symptoms. °You have: °Chest pain. °Watery poop (diarrhea). °A fever. °Your cough gets worse. °You start to have more mucus. °You feel sick to your stomach. °You throw up. °Get help right away if you: °Have shortness of breath. °Have trouble breathing. °Have skin or nails that turn a bluish color. °Have very bad pain or stiffness in your neck. °Get a sudden headache. °Get sudden pain in your face or ear. °Cannot eat or drink without throwing up. °These symptoms may represent a serious problem that is an emergency. Get medical help right away. Call your local emergency services (911 in the [place] °Do not wait to see if the symptoms will go away. °Do not drive yourself to the hospital. °Summary °Influenza is also called "the flu." It is an infection in the lungs, nose, and throat. It spreads easily from person to person. °Take over-the-counter and prescription medicines only as told by your doctor. °Getting a flu shot every year is the best way to not get the flu. °This information is not intended to replace advice given to you by your health care provider. Make sure you discuss any questions you have with your health care provider. °Document Revised: 11/21/2019 Document Reviewed: 11/21/2019 °Elsevier Patient Education © 2022 [employer] ° °

## 2021-05-26 LAB — LIPID PANEL
Chol/HDL Ratio: 4 ratio (ref 0.0–4.4)
Cholesterol, Total: 202 mg/dL — ABNORMAL HIGH (ref 100–199)
HDL: 51 mg/dL (ref >39)
LDL Chol Calc (NIH): 130 mg/dL — ABNORMAL HIGH (ref 0–99)
Triglycerides: 118 mg/dL (ref 0–149)
VLDL Cholesterol Cal: 21 mg/dL (ref 5–40)

## 2021-05-30 ENCOUNTER — Ambulatory Visit (INDEPENDENT_AMBULATORY_CARE_PROVIDER_SITE_OTHER): Payer: Medicaid Other

## 2021-06-10 ENCOUNTER — Encounter (INDEPENDENT_AMBULATORY_CARE_PROVIDER_SITE_OTHER): Payer: Self-pay | Admitting: Primary Care

## 2021-06-10 ENCOUNTER — Other Ambulatory Visit: Payer: Self-pay

## 2021-06-10 ENCOUNTER — Ambulatory Visit (INDEPENDENT_AMBULATORY_CARE_PROVIDER_SITE_OTHER): Payer: Medicaid Other | Admitting: Primary Care

## 2021-06-10 VITALS — BP 139/90 | HR 89 | Temp 98.0°F | Ht 63.0 in | Wt 203.0 lb

## 2021-06-10 DIAGNOSIS — I10 Essential (primary) hypertension: Secondary | ICD-10-CM

## 2021-06-10 DIAGNOSIS — F4323 Adjustment disorder with mixed anxiety and depressed mood: Secondary | ICD-10-CM | POA: Diagnosis not present

## 2021-06-10 DIAGNOSIS — H6123 Impacted cerumen, bilateral: Secondary | ICD-10-CM | POA: Diagnosis not present

## 2021-06-10 MED ORDER — HYDROCHLOROTHIAZIDE 25 MG PO TABS: 25.0000 mg | ORAL_TABLET | Freq: Every day | ORAL | 3 refills | Status: DC

## 2021-06-10 NOTE — Progress Notes (Signed)
Subjective:    Sandra Mcknight is a 41 y.o. female whom I am asked to see for evaluation of diminished hearing in both ears for the past 2 weeks. There is not a prior history of cerumen impaction. The patient has not been using ear drops to loosen wax immediately prior to this visit. The patient denies ear pain.  The patient's history has been marked as reviewed and updated as appropriate. allergies, current medications, past family history, past medical history, past social history, and past surgical history Past Medical History:  Diagnosis Date   Gestational diabetes    Meningitis    Obesity (BMI 35.0-39.9 without comorbidity)    Patient Active Problem List   Diagnosis Date Noted   Muscle spasm 03/31/2016   PROM (premature rupture of membranes) 02/19/2016   GBS (group B Streptococcus carrier), +RV culture, currently pregnant 02/12/2016   Migraine 10/04/2015   Gestational diabetes mellitus (GDM) affecting pregnancy, antepartum 09/23/2015   Rubella non-immune status, antepartum 09/19/2015   Obesity 03/31/2011   Past Surgical History:  Procedure Laterality Date   NO PAST SURGERIES     Family History  Problem Relation Age of Onset   Diabetes Mother    Social History   Socioeconomic History   Marital status: Single    Spouse name: Not on file   Number of children: Not on file   Years of education: Not on file   Highest education level: Not on file  Occupational History   Not on file  Tobacco Use   Smoking status: Light Smoker    Types: Cigarettes    Last attempt to quit: 06/23/2015    Years since quitting: 5.9   Smokeless tobacco: Former  Building services engineer Use: Never used  Substance and Sexual Activity   Alcohol use: Yes    Comment: socially   Drug use: No   Sexual activity: Yes    Birth control/protection: None  Other Topics Concern   Not on file  Social History Narrative   Not on file   Social Determinants of Health   Financial Resource Strain: Not on  file  Food Insecurity: Not on file  Transportation Needs: Not on file  Physical Activity: Not on file  Stress: Not on file  Social Connections: Not on file   Allergies: Patient has no known allergies.  Review of Systems Pertinent items noted in HPI and remainder of comprehensive ROS otherwise negative.  Celita was seen today for cerumen impaction.  Diagnoses and all orders for this visit:  Essential hypertension Counseled on blood pressure goal of less than 130/80, low-sodium, DASH diet, medication compliance, 150 minutes of moderate intensity exercise per week. Discussed medication compliance, adverse effects.  -     hydrochlorothiazide (HYDRODIURIL) 25 MG tablet; Take 1 tablet (25 mg total) by mouth daily.  Adjustment disorder with mixed anxiety and depressed mood Flowsheet Row Office Visit from 06/10/2021 in [facility]  PHQ-9 Total Score 11      Refer to CSW slightly lower taken.than last     Bilateral impacted cerumen  Auditory canal(s) of both ears are completely obstructed with cerumen.  Cerumen was removed using gentle irrigation. Tympanic membranes are intact following the procedure.  Auditory canals are normal.   Cerumen Impaction without otitis externa.   1. Care instructions given. 2. Home treatment: none. 3. Follow-up as needed.  Grayce Sessions

## 2021-06-19 ENCOUNTER — Other Ambulatory Visit: Payer: Self-pay

## 2021-06-19 ENCOUNTER — Encounter (HOSPITAL_COMMUNITY): Payer: Self-pay | Admitting: *Deleted

## 2021-06-19 ENCOUNTER — Ambulatory Visit (INDEPENDENT_AMBULATORY_CARE_PROVIDER_SITE_OTHER): Payer: Medicaid Other

## 2021-06-19 ENCOUNTER — Ambulatory Visit (HOSPITAL_COMMUNITY)
Admission: EM | Admit: 2021-06-19 | Discharge: 2021-06-19 | Disposition: A | Discharge status: Home / Self Care | Payer: Medicaid Other | Attending: Emergency Medicine | Admitting: Emergency Medicine

## 2021-06-19 DIAGNOSIS — M62838 Other muscle spasm: Secondary | ICD-10-CM

## 2021-06-19 DIAGNOSIS — M7918 Myalgia, other site: Secondary | ICD-10-CM | POA: Diagnosis not present

## 2021-06-19 DIAGNOSIS — M25562 Pain in left knee: Secondary | ICD-10-CM

## 2021-06-19 DIAGNOSIS — M25561 Pain in right knee: Secondary | ICD-10-CM

## 2021-06-19 DIAGNOSIS — M25511 Pain in right shoulder: Secondary | ICD-10-CM

## 2021-06-19 DIAGNOSIS — S4991XA Unspecified injury of right shoulder and upper arm, initial encounter: Secondary | ICD-10-CM | POA: Diagnosis not present

## 2021-06-19 HISTORY — DX: Essential (primary) hypertension: I10

## 2021-06-19 MED ORDER — KETOROLAC TROMETHAMINE 60 MG/2ML IM SOLN
INTRAMUSCULAR | Status: AC
  Filled 2021-06-19: qty 2

## 2021-06-19 MED ORDER — IBUPROFEN 800 MG PO TABS: 800.0000 mg | ORAL_TABLET | Freq: Three times a day (TID) | ORAL | 0 refills | Status: AC | PRN

## 2021-06-19 MED ORDER — KETOROLAC TROMETHAMINE 60 MG/2ML IM SOLN
60.0000 mg | Freq: Once | INTRAMUSCULAR | Status: AC
  Administered 2021-06-19: 60 mg via INTRAMUSCULAR

## 2021-06-19 MED ORDER — BACLOFEN 10 MG PO TABS
10.0000 mg | ORAL_TABLET | Freq: Every day | ORAL | 0 refills | Status: AC
Start: 2021-06-19 — End: 2021-06-26
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
## 2021-06-19 NOTE — Discharge Instructions (Addendum)
You received an injection of ketorolac in the office today which should significantly reduce your pain over the next 6 to 8 hours.  Please take ibuprofen 800 mg along with a muscle relaxer called baclofen tonight.  I have sent prescriptions for both of these medications to your pharmacy. ? ?In 2 to 3 days, if you would like to return to urgent care for repeat ketorolac injection, please feel free to do so.  I will be working at the [facility] location at [employer] next week.  We are open from 8-4 Monday through Friday. ? ?I have also provided you with a note to be out of work until Thursday. ? ?Thank you for visiting urgent care today.  I hope you you feel better soon. ?

## 2021-06-22 ENCOUNTER — Ambulatory Visit (INDEPENDENT_AMBULATORY_CARE_PROVIDER_SITE_OTHER): Payer: Self-pay | Admitting: *Deleted

## 2021-06-22 ENCOUNTER — Encounter: Payer: Self-pay | Admitting: Emergency Medicine

## 2021-06-22 ENCOUNTER — Ambulatory Visit
Admission: EM | Admit: 2021-06-22 | Discharge: 2021-06-22 | Disposition: A | Discharge status: Home / Self Care | Payer: Medicaid Other

## 2021-06-22 ENCOUNTER — Other Ambulatory Visit: Payer: Self-pay

## 2021-06-22 DIAGNOSIS — G44311 Acute post-traumatic headache, intractable: Secondary | ICD-10-CM

## 2021-06-22 NOTE — Telephone Encounter (Signed)
? ? ? ? ? ?  Chief Complaint: Chest pain ?Symptoms: pain at sternum after taking HCTZ. No associated symptoms. ?Frequency: 06/11/21 after starting HCTZ ?Pertinent Negatives: Patient denies nausea, sweating, SOB, pain does not radiate, no palpitations,dizziness ?Disposition: [] ED /[] Urgent Care (no appt availability in office) / [] Appointment(In office/virtual)/ []  [facility]/ [] Home Care/ [] Refused Recommended Disposition /[] [facility]/ [x]  Follow-up with PCP ?Additional Notes: Pt states she had pain immediately after taking HCTZ. States stopped taking for 4 days, no pain. Took this AM, pain returns. Also hurts "With movement." Advised ED for worsening symptoms. ?Please advise. Reason for Disposition ? [1] Patient says chest pain feels exactly the same as previously diagnosed "heartburn" AND [2] describes burning in chest AND [3] accompanying sour taste in mouth ?   States after taking HCTZ ? ?Answer Assessment - Initial Assessment Questions ?1. LOCATION: "Where does it hurt?"   ?    Middle at sternum ?2. RADIATION: "Does the pain go anywhere else?" (e.g., into neck, jaw, arms, back) ?    no ?3. ONSET: "When did the chest pain begin?" (Minutes, hours or days)  ?    06/11/21 after taking HCTZ ?4. PATTERN "Does the pain come and go, or has it been constant since it started?"  "Does it get worse with exertion?"  ?    Constant "If I take the pill" ?5. DURATION: "How long does it last" (e.g., seconds, minutes, hours) ?    Stays as long as I take the pills ?6. SEVERITY: "How bad is the pain?"  (e.g., Scale 1-10; mild, moderate, or severe) ?   - MILD (1-3): doesn't interfere with normal activities  ?   - MODERATE (4-7): interferes with normal activities or awakens from sleep ?   - SEVERE (8-10): excruciating pain, unable to do any normal activities   ?    9/10 ?7. CARDIAC RISK FACTORS: "Do you have any history of heart problems or risk factors for heart disease?" (e.g., angina, prior heart attack;  diabetes, high blood pressure, high cholesterol, smoker, or strong family history of heart disease) ?    HTN ?8. PULMONARY RISK FACTORS: "Do you have any history of lung disease?"  (e.g., blood clots in lung, asthma, emphysema, birth control pills) ?     ?9. CAUSE: "What do you think is causing the chest pain?" ?    HCTZ ?10. OTHER SYMPTOMS: "Do you have any other symptoms?" (e.g., dizziness, nausea, vomiting, sweating, fever, difficulty breathing, cough) ?      no ? ?Protocols used: Chest Pain-A-AH ? ?
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
## 2021-06-22 NOTE — Telephone Encounter (Signed)
Routed to PCP. Patient has been advised to go to ED. Any other advise? ?
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
## 2021-06-23 ENCOUNTER — Emergency Department (HOSPITAL_BASED_OUTPATIENT_CLINIC_OR_DEPARTMENT_OTHER): Payer: Medicaid Other

## 2021-06-23 ENCOUNTER — Encounter (HOSPITAL_BASED_OUTPATIENT_CLINIC_OR_DEPARTMENT_OTHER): Payer: Self-pay

## 2021-06-23 ENCOUNTER — Other Ambulatory Visit: Payer: Self-pay

## 2021-06-23 ENCOUNTER — Emergency Department (HOSPITAL_BASED_OUTPATIENT_CLINIC_OR_DEPARTMENT_OTHER)
Admission: EM | Admit: 2021-06-23 | Discharge: 2021-06-23 | Disposition: A | Discharge status: Home / Self Care | Payer: Medicaid Other | Attending: Emergency Medicine | Admitting: Emergency Medicine

## 2021-06-23 DIAGNOSIS — I10 Essential (primary) hypertension: Secondary | ICD-10-CM | POA: Diagnosis not present

## 2021-06-23 DIAGNOSIS — M542 Cervicalgia: Secondary | ICD-10-CM | POA: Diagnosis not present

## 2021-06-23 DIAGNOSIS — S0990XA Unspecified injury of head, initial encounter: Secondary | ICD-10-CM | POA: Diagnosis not present

## 2021-06-23 DIAGNOSIS — Z7982 Long term (current) use of aspirin: Secondary | ICD-10-CM | POA: Insufficient documentation

## 2021-06-23 DIAGNOSIS — Z79899 Other long term (current) drug therapy: Secondary | ICD-10-CM | POA: Diagnosis not present

## 2021-06-23 DIAGNOSIS — R519 Headache, unspecified: Secondary | ICD-10-CM | POA: Diagnosis not present

## 2021-06-23 DIAGNOSIS — Y92410 Unspecified street and highway as the place of occurrence of the external cause: Secondary | ICD-10-CM | POA: Insufficient documentation

## 2021-06-23 DIAGNOSIS — R410 Disorientation, unspecified: Secondary | ICD-10-CM | POA: Insufficient documentation

## 2021-06-23 DIAGNOSIS — S199XXA Unspecified injury of neck, initial encounter: Secondary | ICD-10-CM | POA: Diagnosis not present

## 2021-06-23 LAB — BASIC METABOLIC PANEL
Anion gap: 10 (ref 5–15)
BUN: 5 mg/dL — ABNORMAL LOW (ref 6–20)
CO2: 24 mmol/L (ref 22–32)
Calcium: 9.2 mg/dL (ref 8.9–10.3)
Chloride: 105 mmol/L (ref 98–111)
Creatinine, Ser: 0.68 mg/dL (ref 0.44–1.00)
GFR, Estimated: >60 mL/min (ref >60)
Glucose, Bld: 101 mg/dL — ABNORMAL HIGH (ref 70–99)
Potassium: 4 mmol/L (ref 3.5–5.1)
Sodium: 139 mmol/L (ref 135–145)

## 2021-06-23 LAB — CBC
HCT: 35.4 % — ABNORMAL LOW (ref 36.0–46.0)
Hemoglobin: 11.6 g/dL — ABNORMAL LOW (ref 12.0–15.0)
MCH: 28.4 pg (ref 26.0–34.0)
MCHC: 32.8 g/dL (ref 30.0–36.0)
MCV: 86.8 fL (ref 80.0–100.0)
Platelets: 368 10*3/uL (ref 150–400)
RBC: 4.08 MIL/uL (ref 3.87–5.11)
RDW: 17.1 % — ABNORMAL HIGH (ref 11.5–15.5)
WBC: 7.8 10*3/uL (ref 4.0–10.5)
nRBC: 0 % (ref 0.0–0.2)

## 2021-06-23 LAB — PREGNANCY, URINE: Preg Test, Ur: NEGATIVE

## 2021-06-23 MED ORDER — SODIUM CHLORIDE 0.9 % IV BOLUS
500.0000 mL | Freq: Once | INTRAVENOUS | Status: AC
  Administered 2021-06-23: 16:00:00 500 mL via INTRAVENOUS

## 2021-06-23 MED ORDER — DIPHENHYDRAMINE HCL 50 MG/ML IJ SOLN
12.5000 mg | Freq: Once | INTRAMUSCULAR | Status: AC
Start: 2021-06-23 — End: 2021-06-23
  Administered 2021-06-23: 16:00:00 12.5 mg via INTRAVENOUS
  Filled 2021-06-23: qty 1

## 2021-06-23 MED ORDER — METOCLOPRAMIDE HCL 10 MG PO TABS
10.0000 mg | ORAL_TABLET | Freq: Every evening | ORAL | 0 refills | Status: DC | PRN
  Filled 2021-06-23: qty 5, 5d supply, fill #0

## 2021-06-23 MED ORDER — METOCLOPRAMIDE HCL 5 MG/ML IJ SOLN
10.0000 mg | Freq: Once | INTRAMUSCULAR | Status: AC
Start: 2021-06-23 — End: 2021-06-23
  Administered 2021-06-23: 16:00:00 10 mg via INTRAVENOUS
  Filled 2021-06-23: qty 2

## 2021-06-23 MED ORDER — DEXAMETHASONE SODIUM PHOSPHATE 10 MG/ML IJ SOLN
8.0000 mg | Freq: Once | INTRAMUSCULAR | Status: AC
  Administered 2021-06-23: 17:00:00 8 mg via INTRAVENOUS
  Filled 2021-06-23: qty 1

## 2021-06-23 MED ORDER — IOHEXOL 350 MG/ML SOLN
80.0000 mL | Freq: Once | INTRAVENOUS | Status: AC | PRN
  Administered 2021-06-23: 18:00:00 80 mL via INTRAVENOUS

## 2021-06-23 MED ORDER — KETOROLAC TROMETHAMINE 15 MG/ML IJ SOLN
15.0000 mg | Freq: Once | INTRAMUSCULAR | Status: AC
  Administered 2021-06-23: 17:00:00 15 mg via INTRAVENOUS
  Filled 2021-06-23: qty 1

## 2021-06-23 MED ORDER — METHOCARBAMOL 500 MG PO TABS
500.0000 mg | ORAL_TABLET | Freq: Two times a day (BID) | ORAL | 0 refills | Status: DC | PRN
  Filled 2021-06-23: qty 15, 8d supply, fill #0

## 2021-06-23 NOTE — Telephone Encounter (Signed)
No
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
## 2021-06-23 NOTE — Discharge Instructions (Addendum)
I prescribed a muscle relaxer which she can use as needed, twice a day for your headache and neck pain. ?You can continue taking ibuprofen and Tylenol.  Please do not use Goody powder as well, because this medicine can irritate your stomach when taken with ibuprofen. ? ?If your headache becomes more severe, you can take 10 mg of reglan (1 tablet) as needed, along with 25 mg of benadryl (available over the counter). ? ?If your headache is still more severe, or you begin having dizziness, trouble walking, trouble speaking, blurred vision or loss of vision, please come back to the ER immediately ? ?I placed a referral to see a neurologist.  They should contact you within 2 business days to set up an appointment. ?

## 2021-06-24 ENCOUNTER — Encounter: Payer: Self-pay | Admitting: Neurology

## 2021-06-24 ENCOUNTER — Other Ambulatory Visit (HOSPITAL_BASED_OUTPATIENT_CLINIC_OR_DEPARTMENT_OTHER): Payer: Self-pay

## 2021-07-05 ENCOUNTER — Other Ambulatory Visit: Payer: Self-pay

## 2021-07-05 ENCOUNTER — Encounter (INDEPENDENT_AMBULATORY_CARE_PROVIDER_SITE_OTHER): Payer: Self-pay | Admitting: Primary Care

## 2021-07-05 ENCOUNTER — Ambulatory Visit (INDEPENDENT_AMBULATORY_CARE_PROVIDER_SITE_OTHER): Payer: Medicaid Other | Admitting: Primary Care

## 2021-07-05 VITALS — BP 130/86 | HR 92 | Temp 98.8°F | Ht 63.0 in | Wt 202.8 lb

## 2021-07-05 DIAGNOSIS — R7303 Prediabetes: Secondary | ICD-10-CM

## 2021-07-05 DIAGNOSIS — I10 Essential (primary) hypertension: Secondary | ICD-10-CM

## 2021-07-05 MED ORDER — LISINOPRIL 2.5 MG PO TABS: 2.5000 mg | ORAL_TABLET | Freq: Every day | ORAL | 1 refills | Status: DC

## 2021-07-05 NOTE — Progress Notes (Signed)
?[place] ? ?Sandra Mcknight, is a 41 y.o. female ? ?[id number] ? ?[id number] ? ?DOB - [birth year] ? ?Chief Complaint  ?Patient presents with  ? Follow-up  ?  Blood pressure  ?    ? ?Subjective:  ? ?Sandra Mcknight is a 41 y.o. female here today for a follow up visit. Patient has No headache, No chest pain, No abdominal pain - No Nausea, No new weakness tingling or numbness, No Cough -shortness of breath.  ?No problems updated. ? ?No Known Allergies ? ?Past Medical History:  ?Diagnosis Date  ? Gestational diabetes   ? Hypertension   ? Meningitis   ? Obesity (BMI 35.0-39.9 without comorbidity)   ? ? ?Current Outpatient Medications on File Prior to Visit  ?Medication Sig Dispense Refill  ? acetaminophen (TYLENOL) 500 MG tablet Take 1,000 mg by mouth every 6 (six) hours as needed for moderate pain or headache.    ? metoCLOPramide (REGLAN) 10 MG tablet Take 1 tablet (10 mg total) by mouth at bedtime as needed for up to 5 doses for nausea. 5 tablet 0  ? Aspirin-Salicylamide-Caffeine (BC HEADACHE POWDER PO) Take by mouth.    ? ?No current facility-administered medications on file prior to visit.  ? ? ?Objective:  ? ?Vitals:  ? 07/05/21 1007  ?BP: 130/86  ?Pulse: 92  ?Temp: 98.8 ?F (37.1 ?C)  ?TempSrc: Oral  ?SpO2: 95%  ?Weight: 202 lb 12.8 oz (92 kg)  ?Height: 5\' 3"  (1.6 m)  ? ? ?Exam ?General appearance : Awake, alert, not in any distress. Speech Clear. Not toxic looking ?HEENT: Atraumatic and Normocephalic, pupils equally reactive to light and accomodation ?Neck: Supple, no JVD. No cervical lymphadenopathy.  ?Chest: Good air entry bilaterally, no added sounds  ?CVS: S1 S2 regular, no murmurs.  ?Abdomen: Bowel sounds present, Non tender and not distended with no gaurding, rigidity or rebound. ?Extremities: B/L Lower Ext shows no edema, both legs are warm to touch ?Neurology: Awake alert, and oriented X 3,, Non focal deficits  ?Skin: No Rash ? ?Data Review ?Lab Results  ?Component Value Date  ?  HGBA1C 6.0 (A) 05/25/2021  ? HGBA1C 6.0 (H) 10/04/2015  ? HGBA1C 6.5 (H) 03/31/2011  ? ? ?Assessment & Plan  ?Simora was seen today for follow-up. ? ?Diagnoses and all orders for this visit: ? ?Essential hypertension ?Counseled on blood pressure goal of less than 130/80, low-sodium, DASH diet, medication compliance, 150 minutes of moderate intensity exercise per week. ?Discussed medication compliance, adverse effects.  ?-     lisinopril (ZESTRIL) 2.5 MG tablet; Take 1 tablet (2.5 mg total) by mouth daily. ? ?Prediabetes ?A1C 6.0. Decided at this time would monitor foods that are high in carbohydrates are the following rice, potatoes, breads, sugars, and pastas.  Reduction in the intake (eating) will assist in lowering your blood sugars.  ?-     lisinopril (ZESTRIL) 2.5 MG tablet; Take 1 tablet (2.5 mg total) by mouth daily. ? ?  ?There are no diagnoses linked to this encounter. ? ? ?Patient have been counseled extensively about nutrition and exercise. Other issues discussed during this visit include: low cholesterol diet, weight control and daily exercise, foot care, annual eye examinations at Ophthalmology, importance of adherence with medications and regular follow-up. We also discussed long term complications of uncontrolled diabetes and hypertension.  ? ?Return in about 6 months (around 01/05/2022) for Prediabetes, weight, HTN, fasting labs . ? ?The patient was given clear instructions to go to ER or return to medical center  if symptoms don't improve, worsen or new problems develop. The patient verbalized understanding. The patient was told to call to get lab results if they haven't heard anything in the next week.  ? ?This note has been created with Surveyor, quantity. Any transcriptional errors are unintentional.  ? ?Kerin Perna, NP ?07/10/2021, 6:33 PM  ?

## 2021-08-04 ENCOUNTER — Ambulatory Visit (HOSPITAL_COMMUNITY)
Admission: EM | Admit: 2021-08-04 | Discharge: 2021-08-04 | Disposition: A | Discharge status: Home / Self Care | Payer: Medicaid Other | Attending: Emergency Medicine | Admitting: Emergency Medicine

## 2021-08-04 ENCOUNTER — Encounter (HOSPITAL_COMMUNITY): Payer: Self-pay

## 2021-08-04 DIAGNOSIS — G43009 Migraine without aura, not intractable, without status migrainosus: Secondary | ICD-10-CM

## 2021-08-04 MED ORDER — DEXAMETHASONE SODIUM PHOSPHATE 10 MG/ML IJ SOLN
10.0000 mg | Freq: Once | INTRAMUSCULAR | Status: AC
  Administered 2021-08-04: 10 mg via INTRAMUSCULAR

## 2021-08-04 MED ORDER — ACETAMINOPHEN 325 MG PO TABS
975.0000 mg | ORAL_TABLET | Freq: Once | ORAL | Status: AC
  Administered 2021-08-04: 975 mg via ORAL

## 2021-08-04 MED ORDER — DEXAMETHASONE SODIUM PHOSPHATE 10 MG/ML IJ SOLN
INTRAMUSCULAR | Status: AC
  Filled 2021-08-04: qty 1

## 2021-08-04 MED ORDER — ACETAMINOPHEN 325 MG PO TABS
ORAL_TABLET | ORAL | Status: AC
  Filled 2021-08-04: qty 3

## 2021-08-04 MED ORDER — BUTALBITAL-APAP-CAFFEINE 50-325-40 MG PO TABS: 1.0000 | ORAL_TABLET | ORAL | 0 refills | Status: DC | PRN

## 2021-08-04 MED ORDER — KETOROLAC TROMETHAMINE 30 MG/ML IJ SOLN
INTRAMUSCULAR | Status: AC
  Filled 2021-08-04: qty 1

## 2021-08-04 MED ORDER — KETOROLAC TROMETHAMINE 30 MG/ML IJ SOLN
30.0000 mg | Freq: Once | INTRAMUSCULAR | Status: AC
  Administered 2021-08-04: 30 mg via INTRAMUSCULAR

## 2021-08-04 MED ORDER — IBUPROFEN 600 MG PO TABS: 600.0000 mg | ORAL_TABLET | Freq: Four times a day (QID) | ORAL | 0 refills | Status: AC | PRN

## 2021-08-04 MED ORDER — ONDANSETRON 4 MG PO TBDP
4.0000 mg | ORAL_TABLET | Freq: Once | ORAL | Status: AC
  Administered 2021-08-04: 4 mg via ORAL

## 2021-08-04 MED ORDER — ONDANSETRON 4 MG PO TBDP
ORAL_TABLET | ORAL | Status: AC
  Filled 2021-08-04: qty 1

## 2021-08-04 NOTE — ED Provider Notes (Signed)
?HPI ? ?SUBJECTIVE: ? ?Sandra Mcknight is a 41 y.o. female who reports 4 days of a gradual onset constant, daily, left sided posterior headache/pressure with photophobia.  No fevers, nausea, vomiting, photophobia, ear pain, jaw pain, nasal congestion, sinus pain or pressure, visual changes, slurred speech, arm or leg weakness, facial droop, discoordination, neck stiffness, rash.  She reports left upper dental pain only with exposure to cold temperatures at a "bad tooth".  No gingival swelling, pain, purulent drainage.  She has follow-up with her dentist in 1-1/2 weeks for this.  She has tried Goody's powder, ibuprofen 800 mg without improvement in her symptoms.  Symptoms worse with noise.  She is here because her normal medications are not working.  She states this does not feel like meningitis, but like previous migraines.  This is not the worst headache of her life.  She has a past medical history of migraine, meningitis x2, hypertension, gestational diabetes, which has resolved.  LMP: Last week.  Denies the possibility being pregnant.  PCP: [facility].  She is establishing care with neurology next month ? ?She has been seen here twice this year for headaches, once for migraine.  She was given Solu-Medrol, Toradol with improvement in her migraine symptoms.  ? ?Past Medical History:  ?Diagnosis Date  ? Gestational diabetes   ? Hypertension   ? Meningitis   ? Obesity (BMI 35.0-39.9 without comorbidity)   ? ? ?Past Surgical History:  ?Procedure Laterality Date  ? NO PAST SURGERIES    ? ? ?Family History  ?Problem Relation Age of Onset  ? Diabetes Mother   ? ? ?Social History  ? ?Tobacco Use  ? Smoking status: Every Day  ?  Packs/day: 0.50  ?  Types: Cigarettes  ?  Last attempt to quit: 06/23/2015  ?  Years since quitting: 6.1  ? Smokeless tobacco: Former  ?Vaping Use  ? Vaping Use: Never used  ?Substance Use Topics  ? Alcohol use: Yes  ?  Comment: socially  ? Drug use: No  ? ? ?No current  facility-administered medications for this encounter. ? ?Current Outpatient Medications:  ?  butalbital-acetaminophen-caffeine (FIORICET) 50-325-40 MG tablet, Take 1-2 tablets by mouth every 4 (four) hours as needed for headache. Max 6 caps/day, Disp: 20 tablet, Rfl: 0 ?  ibuprofen (ADVIL) 600 MG tablet, Take 1 tablet (600 mg total) by mouth every 6 (six) hours as needed., Disp: 30 tablet, Rfl: 0 ?  Aspirin-Salicylamide-Caffeine (BC HEADACHE POWDER PO), Take by mouth., Disp: , Rfl:  ?  lisinopril (ZESTRIL) 2.5 MG tablet, Take 1 tablet (2.5 mg total) by mouth daily., Disp: 90 tablet, Rfl: 1 ?  metoCLOPramide (REGLAN) 10 MG tablet, Take 1 tablet (10 mg total) by mouth at bedtime as needed for up to 5 doses for nausea., Disp: 5 tablet, Rfl: 0 ? ?No Known Allergies ? ? ?ROS ? ?As noted in HPI.  ? ?Physical Exam ? ?BP 133/60   Pulse 78   Temp 97.9 ?F (36.6 ?C) (Oral)   Resp 18   LMP 07/26/2021   SpO2 98%  ? ?Constitutional: Well developed, well nourished, no acute distress ?Eyes: PERRL, EOMI, conjunctiva normal bilaterally.  No photophobia ?HENT: Normocephalic, atraumatic,mucus membranes moist, nontender left upper decayed molar.  No gingival swelling, expressible purulent drainage, gingival tenderness. TM normal b/l. No TMJ tenderness. No nasal congestion, no sinus tenderness. No temporal artery tenderness.  ?Neck: no cervical LN.  Positive tenderness at the insertion of the left trapezius.  No other  trapezial muscle tenderness. No meningismus ?Respiratory: normal inspiratory effort ?Cardiovascular: Normal rate ?GI:  nondistended ?skin: No rash, skin intact ?Musculoskeletal: No edema, no tenderness, no deformities ?Neurologic: Alert & oriented x 3, CN III-XII intact, romberg neg, finger-> nose, heel-> shin equal b/l, Romberg neg, tandem gait steady ?Psychiatric: Speech and behavior appropriate ? ? ?ED Course ? ? ?Medications  ?acetaminophen (TYLENOL) tablet 975 mg (975 mg Oral Given 08/04/21 0931)  ?ketorolac  (TORADOL) 30 MG/ML injection 30 mg (30 mg Intramuscular Given 08/04/21 0931)  ?ondansetron (ZOFRAN-ODT) disintegrating tablet 4 mg (4 mg Oral Given 08/04/21 0932)  ?dexamethasone (DECADRON) injection 10 mg (10 mg Intramuscular Given 08/04/21 0931)  ? ? ?No orders of the defined types were placed in this encounter. ? ?No results found for this or any previous visit (from the past 24 hour(s)). ?No results found. ? ? ?ED Clinical Impression ? ?1. Migraine without aura and without status migrainosus, not intractable   ? ? ?ED Assessment/Plan ? ?Outside records reviewed.  As noted in HPI. ? ?Pt describing typical pain, no sudden onset. Doubt SAH, ICH or space occupying lesion. Pt without fevers/chills, Pt has no meningeal sx, no nuchal rigidity. Doubt meningitis. Pt with normal neuro exam, no evidence of CVA/TIA.  Pt BP not elevated significantly, doubt hypertensive emergency. No evidence of temporal artery tenderness, no evidence of glaucoma or other ocular pathology. Will give headache cocktail (dexamethasone 10 IM, Toradol 30 IM, Tylenol 975 p.o. and Zofran 4 mg p.o.) and reassess. ? ?[place] controlled substances registry for this patient consulted and feel the risk/benefit ratio today is favorable for proceeding with this prescription for a controlled substance.  No opiate prescriptions in the past 2 years. ? ?Pt much improved after medications. Pt with continued non-focal neuro exam. Will d/c home with Naprosyn/Tylenol, Fioricet and have pt F/U with PCP.  She is establishing care with a neurologist next month.  Discussed MDM, plan for follow up, signs and sx that should prompt return to ER. Pt agrees with plan ? ?Meds ordered this encounter  ?Medications  ? acetaminophen (TYLENOL) tablet 975 mg  ? ketorolac (TORADOL) 30 MG/ML injection 30 mg  ? ondansetron (ZOFRAN-ODT) disintegrating tablet 4 mg  ? dexamethasone (DECADRON) injection 10 mg  ? ibuprofen (ADVIL) 600 MG tablet  ?  Sig: Take 1 tablet (600 mg total)  by mouth every 6 (six) hours as needed.  ?  Dispense:  30 tablet  ?  Refill:  0  ? butalbital-acetaminophen-caffeine (FIORICET) 50-325-40 MG tablet  ?  Sig: Take 1-2 tablets by mouth every 4 (four) hours as needed for headache. Max 6 caps/day  ?  Dispense:  20 tablet  ?  Refill:  0  ? ? ?*This clinic note was created using Scientist, clinical (histocompatibility and immunogenetics). Therefore, there may be occasional mistakes despite careful proofreading. ? ??  ?  ?Domenick Gong, MD ?08/04/21 1205 ? ?

## 2021-08-04 NOTE — ED Triage Notes (Addendum)
Patient presents to Urgent Care with complaints of migraine since Monday. Patient reports she has been taking goodies and 800 mg ibuprofen L sided head pressure. 10/10 pain  ?Pt report no otc med today  ?Pmh of migranes used to be on monthy injections ?

## 2021-08-04 NOTE — Discharge Instructions (Addendum)
Take 600 mg of ibuprofen, 1000 mg of Tylenol 3-4 times a day.  Or you can take ibuprofen with the Fioricet.  Do not take Tylenol and Fioricet as they both have Tylenol in them.  I would stop the [facility] headache powders for now. ?
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
## 2021-08-25 ENCOUNTER — Ambulatory Visit: Payer: Medicaid Other | Admitting: Neurology

## 2021-08-25 ENCOUNTER — Encounter: Payer: Self-pay | Admitting: Neurology

## 2021-08-25 VITALS — BP 119/82 | HR 88 | Ht 62.0 in | Wt 198.2 lb

## 2021-08-25 DIAGNOSIS — F17200 Nicotine dependence, unspecified, uncomplicated: Secondary | ICD-10-CM

## 2021-08-25 DIAGNOSIS — G43019 Migraine without aura, intractable, without status migrainosus: Secondary | ICD-10-CM

## 2021-08-25 DIAGNOSIS — F419 Anxiety disorder, unspecified: Secondary | ICD-10-CM

## 2021-08-25 DIAGNOSIS — G4700 Insomnia, unspecified: Secondary | ICD-10-CM

## 2021-08-25 MED ORDER — RIZATRIPTAN BENZOATE 10 MG PO TABS: 10.0000 mg | ORAL_TABLET | ORAL | 5 refills | Status: DC | PRN

## 2021-08-25 MED ORDER — NORTRIPTYLINE HCL 10 MG PO CAPS: 10.0000 mg | ORAL_CAPSULE | Freq: Every day | ORAL | 5 refills | Status: DC

## 2021-08-25 NOTE — Patient Instructions (Signed)
?  Start nortriptyline 10mg  at bedtime.  Contact in 4 weeks with update and we can increase dose if needed. ?STOP BC POWDER AND IBUPROFEN.  Take rizatriptan 10mg  at earliest onset of headache.  May repeat dose once in 2 hours if needed.  Maximum 2 tablets in 24 hours. ?Continue metoclopramide for nausea ?Limit use of pain relievers to no more than 2 days out of the week.  These medications include acetaminophen, NSAIDs (ibuprofen/Advil/Motrin, naproxen/Aleve, triptans (Imitrex/sumatriptan), Excedrin, and narcotics.  This will help reduce risk of rebound headaches. ?Be aware of common food triggers: ? - Caffeine:  coffee, black tea, cola, Mt. Dew ? - Chocolate ? - Dairy:  aged cheeses (brie, blue, cheddar, gouda, [place], provolone, [place], Swiss, etc), chocolate milk, buttermilk, sour cream, limit eggs and yogurt ? - Nuts, peanut butter ? - Alcohol ? - Cereals/grains:  FRESH breads (fresh bagels, sourdough, doughnuts), yeast productions ? - Processed/canned/aged/cured meats (pre-packaged deli meats, hotdogs) ? - MSG/glutamate:  soy sauce, flavor enhancer, pickled/preserved/marinated foods ? - Sweeteners:  aspartame (Equal, Nutrasweet).  Sugar and Splenda are okay ? - Vegetables:  legumes (lima beans, lentils, snow peas, fava beans, pinto peans, peas, garbanzo beans), sauerkraut, onions, olives, pickles ? - Fruit:  avocados, bananas, citrus fruit (orange, lemon, grapefruit), mango ? - Other:  Frozen meals, macaroni and cheese ?Routine exercise ?Stay adequately hydrated (aim for 64 oz water daily) ?Keep headache diary ?Maintain proper stress management ?Maintain proper sleep hygiene ?Do not skip meals ?Consider supplements:  magnesium citrate 400mg  daily, riboflavin 400mg  daily, coenzyme Q10 100mg  three times daily. ?Follow up 4-5 months. ? ?

## 2021-09-05 ENCOUNTER — Ambulatory Visit
Admission: EM | Admit: 2021-09-05 | Discharge: 2021-09-05 | Disposition: A | Discharge status: Home / Self Care | Payer: Medicaid Other | Attending: Urgent Care | Admitting: Urgent Care

## 2021-09-05 ENCOUNTER — Encounter: Payer: Self-pay | Admitting: Emergency Medicine

## 2021-09-05 DIAGNOSIS — R070 Pain in throat: Secondary | ICD-10-CM | POA: Insufficient documentation

## 2021-09-05 DIAGNOSIS — R0982 Postnasal drip: Secondary | ICD-10-CM | POA: Diagnosis not present

## 2021-09-05 DIAGNOSIS — R052 Subacute cough: Secondary | ICD-10-CM | POA: Insufficient documentation

## 2021-09-05 DIAGNOSIS — J069 Acute upper respiratory infection, unspecified: Secondary | ICD-10-CM | POA: Diagnosis not present

## 2021-09-05 DIAGNOSIS — F17200 Nicotine dependence, unspecified, uncomplicated: Secondary | ICD-10-CM | POA: Diagnosis not present

## 2021-09-05 LAB — POCT RAPID STREP A (OFFICE): Rapid Strep A Screen: NEGATIVE

## 2021-09-05 MED ORDER — BENZONATATE 100 MG PO CAPS: 100.0000 mg | ORAL_CAPSULE | Freq: Three times a day (TID) | ORAL | 0 refills | Status: DC | PRN

## 2021-09-05 MED ORDER — PROMETHAZINE-DM 6.25-15 MG/5ML PO SYRP
5.0000 mL | ORAL_SOLUTION | Freq: Every evening | ORAL | 0 refills | Status: DC | PRN
Start: 2021-09-05 — End: 2022-11-03

## 2021-09-05 MED ORDER — LEVOCETIRIZINE DIHYDROCHLORIDE 5 MG PO TABS: 5.0000 mg | ORAL_TABLET | Freq: Every evening | ORAL | 0 refills | Status: DC

## 2021-09-05 MED ORDER — PREDNISONE 20 MG PO TABS: ORAL_TABLET | ORAL | 0 refills | Status: DC

## 2021-09-05 NOTE — ED Provider Notes (Signed)
[facility]   MRN: [id number] DOB: 1980-10-19  Subjective:   Sandra Mcknight is a 41 y.o. female presenting for 3-day history of persistent malaise, fatigue, throat pain, painful swallowing, congestion and postnasal drainage, persistent coughing that is worse at night.  No chest pain, shortness of breath or wheezing.  Patient is a smoker, does half pack per day.  No history of asthma or COPD.  Had 1 sick contact with her son who had very similar symptoms.  No current facility-administered medications for this encounter.  Current Outpatient Medications:    Aspirin-Salicylamide-Caffeine (BC HEADACHE POWDER PO), Take by mouth., Disp: , Rfl:    butalbital-acetaminophen-caffeine (FIORICET) 50-325-40 MG tablet, Take 1-2 tablets by mouth every 4 (four) hours as needed for headache. Max 6 caps/day (Patient not taking: Reported on 08/25/2021), Disp: 20 tablet, Rfl: 0   ibuprofen (ADVIL) 600 MG tablet, Take 1 tablet (600 mg total) by mouth every 6 (six) hours as needed., Disp: 30 tablet, Rfl: 0   lisinopril (ZESTRIL) 2.5 MG tablet, Take 1 tablet (2.5 mg total) by mouth daily., Disp: 90 tablet, Rfl: 1   metoCLOPramide (REGLAN) 10 MG tablet, Take 1 tablet (10 mg total) by mouth at bedtime as needed for up to 5 doses for nausea., Disp: 5 tablet, Rfl: 0   nortriptyline (PAMELOR) 10 MG capsule, Take 1 capsule (10 mg total) by mouth at bedtime., Disp: 30 capsule, Rfl: 5   rizatriptan (MAXALT) 10 MG tablet, Take 1 tablet (10 mg total) by mouth as needed for migraine (May repeat after 2 hours.  Maximum 2 tablets in 24 hours.). May repeat in 2 hours if needed, Disp: 10 tablet, Rfl: 5   No Known Allergies  Past Medical History:  Diagnosis Date   Gestational diabetes    Hypertension    Meningitis    Obesity (BMI 35.0-39.9 without comorbidity)      Past Surgical History:  Procedure Laterality Date   NO PAST SURGERIES      Family History  Problem Relation Age of Onset    Migraines Mother    Diabetes Mother    Migraines Daughter    Migraines Son     Social History   Tobacco Use   Smoking status: Every Day    Packs/day: 0.50    Types: Cigarettes    Last attempt to quit: 06/23/2015    Years since quitting: 6.2   Smokeless tobacco: Former  Building services engineer Use: Never used  Substance Use Topics   Alcohol use: Yes    Comment: socially   Drug use: No    ROS   Objective:   Vitals: BP 130/83   Pulse 94   Temp 97.6 F (36.4 C)   Resp 20   SpO2 98%   Physical Exam Constitutional:      General: She is not in acute distress.    Appearance: Normal appearance. She is well-developed. She is not ill-appearing, toxic-appearing or diaphoretic.  HENT:     Head: Normocephalic and atraumatic.     Right Ear: External ear normal.     Left Ear: External ear normal.     Nose: Congestion present. No rhinorrhea.     Mouth/Throat:     Mouth: Mucous membranes are moist.     Pharynx: No pharyngeal swelling, oropharyngeal exudate, posterior oropharyngeal erythema or uvula swelling.     Tonsils: No tonsillar exudate or tonsillar abscesses. 0 on the right. 0 on the left.  Comments: Thick streaks of postnasal drainage overlying pharynx. Eyes:     General: No scleral icterus.       Right eye: No discharge.        Left eye: No discharge.     Extraocular Movements: Extraocular movements intact.  Cardiovascular:     Rate and Rhythm: Normal rate and regular rhythm.     Heart sounds: Normal heart sounds. No murmur heard.   No friction rub. No gallop.  Pulmonary:     Effort: Pulmonary effort is normal. No respiratory distress.     Breath sounds: No stridor. No wheezing, rhonchi or rales.  Chest:     Chest wall: No tenderness.  Skin:    General: Skin is warm and dry.  Neurological:     General: No focal deficit present.     Mental Status: She is alert and oriented to person, place, and time.  Psychiatric:        Mood and Affect: Mood normal.         Behavior: Behavior normal.    Results for orders placed or performed during the hospital encounter of 09/05/21 (from the past 24 hour(s))  POCT rapid strep A     Status: Normal   Collection Time: 09/05/21 11:05 AM  Result Value Ref Range   Rapid Strep A Screen Negative Negative    Assessment and Plan :   PDMP not reviewed this encounter.  1. Viral upper respiratory infection   2. Throat pain   3. Subacute cough   4. Post-nasal drainage   5. Smoker    Deferred imaging given clear cardiopulmonary exam, hemodynamically stable vital signs.  Strep culture pending.  In the context of her smoking and respiratory symptoms, recommended an oral prednisone course. Suspect viral URI, viral syndrome. Physical exam findings reassuring and vital signs stable for discharge. Advised supportive care, offered symptomatic relief. Counseled patient on potential for adverse effects with medications prescribed/recommended today, ER and return-to-clinic precautions discussed, patient verbalized understanding.     Wallis Bamberg, PA-C 09/05/21 1130

## 2021-09-05 NOTE — ED Triage Notes (Signed)
Pt here with sore throat x 3 days. Initially she thought it was from a cough she had, but now cough is gone and sore throat remains.

## 2021-09-09 LAB — CULTURE, GROUP A STREP (THRC)

## 2022-01-05 ENCOUNTER — Ambulatory Visit (INDEPENDENT_AMBULATORY_CARE_PROVIDER_SITE_OTHER): Payer: Medicaid Other | Admitting: Primary Care

## 2022-01-17 ENCOUNTER — Encounter (INDEPENDENT_AMBULATORY_CARE_PROVIDER_SITE_OTHER): Payer: Self-pay | Admitting: Primary Care

## 2022-01-17 ENCOUNTER — Ambulatory Visit (INDEPENDENT_AMBULATORY_CARE_PROVIDER_SITE_OTHER): Payer: Medicaid Other | Admitting: Primary Care

## 2022-01-17 VITALS — BP 138/93 | HR 89 | Resp 16 | Wt 188.2 lb

## 2022-01-17 DIAGNOSIS — K219 Gastro-esophageal reflux disease without esophagitis: Secondary | ICD-10-CM | POA: Diagnosis not present

## 2022-01-17 DIAGNOSIS — Z720 Tobacco use: Secondary | ICD-10-CM | POA: Diagnosis not present

## 2022-01-17 DIAGNOSIS — E041 Nontoxic single thyroid nodule: Secondary | ICD-10-CM | POA: Diagnosis not present

## 2022-01-17 DIAGNOSIS — R7303 Prediabetes: Secondary | ICD-10-CM | POA: Diagnosis not present

## 2022-01-17 DIAGNOSIS — Z1159 Encounter for screening for other viral diseases: Secondary | ICD-10-CM | POA: Diagnosis not present

## 2022-01-17 DIAGNOSIS — I10 Essential (primary) hypertension: Secondary | ICD-10-CM

## 2022-01-17 DIAGNOSIS — N920 Excessive and frequent menstruation with regular cycle: Secondary | ICD-10-CM | POA: Diagnosis not present

## 2022-01-17 DIAGNOSIS — E66812 Obesity, class 2: Secondary | ICD-10-CM

## 2022-01-17 MED ORDER — OMEPRAZOLE 20 MG PO CPDR: 20.0000 mg | DELAYED_RELEASE_CAPSULE | Freq: Every day | ORAL | 3 refills | Status: DC

## 2022-01-17 MED ORDER — HYDROCHLOROTHIAZIDE 25 MG PO TABS: 25.0000 mg | ORAL_TABLET | Freq: Every day | ORAL | 3 refills | Status: DC

## 2022-01-17 MED ORDER — AMLODIPINE BESYLATE 5 MG PO TABS: 5.0000 mg | ORAL_TABLET | Freq: Every day | ORAL | 0 refills | Status: DC
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
## 2022-01-17 NOTE — Patient Instructions (Signed)
Hypertension, Adult High blood pressure (hypertension) is when the force of blood pumping through the arteries is too strong. The arteries are the blood vessels that carry blood from the heart throughout the body. Hypertension forces the heart to work harder to pump blood and may cause arteries to become narrow or stiff. Untreated or uncontrolled hypertension can lead to a heart attack, heart failure, a stroke, kidney disease, and other problems. A blood pressure reading consists of a higher number over a lower number. Ideally, your blood pressure should be below 120/80. The first ("top") number is called the systolic pressure. It is a measure of the pressure in your arteries as your heart beats. The second ("bottom") number is called the diastolic pressure. It is a measure of the pressure in your arteries as the heart relaxes. What are the causes? The exact cause of this condition is not known. There are some conditions that result in high blood pressure. What increases the risk? Certain factors may make you more likely to develop high blood pressure. Some of these risk factors are under your control, including: Smoking. Not getting enough exercise or physical activity. Being overweight. Having too much fat, sugar, calories, or salt (sodium) in your diet. Drinking too much alcohol. Other risk factors include: Having a personal history of heart disease, diabetes, high cholesterol, or kidney disease. Stress. Having a family history of high blood pressure and high cholesterol. Having obstructive sleep apnea. Age. The risk increases with age. What are the signs or symptoms? High blood pressure may not cause symptoms. Very high blood pressure (hypertensive crisis) may cause: Headache. Fast or irregular heartbeats (palpitations). Shortness of breath. Nosebleed. Nausea and vomiting. Vision changes. Severe chest pain, dizziness, and seizures. How is this diagnosed? This condition is diagnosed by  measuring your blood pressure while you are seated, with your arm resting on a flat surface, your legs uncrossed, and your feet flat on the floor. The cuff of the blood pressure monitor will be placed directly against the skin of your upper arm at the level of your heart. Blood pressure should be measured at least twice using the same arm. Certain conditions can cause a difference in blood pressure between your right and left arms. If you have a high blood pressure reading during one visit or you have normal blood pressure with other risk factors, you may be asked to: Return on a different day to have your blood pressure checked again. Monitor your blood pressure at home for 1 week or longer. If you are diagnosed with hypertension, you may have other blood or imaging tests to help your health care provider understand your overall risk for other conditions. How is this treated? This condition is treated by making healthy lifestyle changes, such as eating healthy foods, exercising more, and reducing your alcohol intake. You may be referred for counseling on a healthy diet and physical activity. Your health care provider may prescribe medicine if lifestyle changes are not enough to get your blood pressure under control and if: Your systolic blood pressure is above 130. Your diastolic blood pressure is above 80. Your personal target blood pressure may vary depending on your medical conditions, your age, and other factors. Follow these instructions at home: Eating and drinking  Eat a diet that is high in fiber and potassium, and low in sodium, added sugar, and fat. An example of this eating plan is called the DASH diet. DASH stands for Dietary Approaches to Stop Hypertension. To eat this way: Eat   plenty of fresh fruits and vegetables. Try to fill one half of your plate at each meal with fruits and vegetables. Eat whole grains, such as whole-wheat pasta, brown rice, or whole-grain bread. Fill about one  fourth of your plate with whole grains. Eat or drink low-fat dairy products, such as skim milk or low-fat yogurt. Avoid fatty cuts of meat, processed or cured meats, and poultry with skin. Fill about one fourth of your plate with lean proteins, such as fish, chicken without skin, beans, eggs, or tofu. Avoid pre-made and processed foods. These tend to be higher in sodium, added sugar, and fat. Reduce your daily sodium intake. Many people with hypertension should eat less than 1,500 mg of sodium a day. Do not drink alcohol if: Your health care provider tells you not to drink. You are pregnant, may be pregnant, or are planning to become pregnant. If you drink alcohol: Limit how much you have to: 0-1 drink a day for women. 0-2 drinks a day for men. Know how much alcohol is in your drink. In the [place] one drink equals one 12 oz bottle of beer (355 mL), one 5 oz glass of wine (148 mL), or one 1 oz glass of hard liquor (44 mL). Lifestyle  Work with your health care provider to maintain a healthy body weight or to lose weight. Ask what an ideal weight is for you. Get at least 30 minutes of exercise that causes your heart to beat faster (aerobic exercise) most days of the week. Activities may include walking, swimming, or biking. Include exercise to strengthen your muscles (resistance exercise), such as Pilates or lifting weights, as part of your weekly exercise routine. Try to do these types of exercises for 30 minutes at least 3 days a week. Do not use any products that contain nicotine or tobacco. These products include cigarettes, chewing tobacco, and vaping devices, such as e-cigarettes. If you need help quitting, ask your health care provider. Monitor your blood pressure at home as told by your health care provider. Keep all follow-up visits. This is important. Medicines Take over-the-counter and prescription medicines only as told by your health care provider. Follow directions carefully. Blood  pressure medicines must be taken as prescribed. Do not skip doses of blood pressure medicine. Doing this puts you at risk for problems and can make the medicine less effective. Ask your health care provider about side effects or reactions to medicines that you should watch for. Contact a health care provider if you: Think you are having a reaction to a medicine you are taking. Have headaches that keep coming back (recurring). Feel dizzy. Have swelling in your ankles. Have trouble with your vision. Get help right away if you: Develop a severe headache or confusion. Have unusual weakness or numbness. Feel faint. Have severe pain in your chest or abdomen. Vomit repeatedly. Have trouble breathing. These symptoms may be an emergency. Get help right away. Call 911. Do not wait to see if the symptoms will go away. Do not drive yourself to the hospital. Summary Hypertension is when the force of blood pumping through your arteries is too strong. If this condition is not controlled, it may put you at risk for serious complications. Your personal target blood pressure may vary depending on your medical conditions, your age, and other factors. For most people, a normal blood pressure is less than 120/80. Hypertension is treated with lifestyle changes, medicines, or a combination of both. Lifestyle changes include losing weight, eating a healthy,   low-sodium diet, exercising more, and limiting alcohol. This information is not intended to replace advice given to you by your health care provider. Make sure you discuss any questions you have with your health care provider. Document Revised: 02/08/2021 Document Reviewed: 02/08/2021 Elsevier Patient Education  2023 [employer] Inc.  

## 2022-01-18 LAB — HEMOGLOBIN A1C

## 2022-01-18 LAB — CMP14+EGFR

## 2022-01-18 LAB — MICROALBUMIN / CREATININE URINE RATIO
Creatinine, Urine: 118.1 mg/dL
Microalb/Creat Ratio: 8 mg/g creat (ref 0–29)
Microalbumin, Urine: 9.7 ug/mL

## 2022-01-18 LAB — CBC WITH DIFFERENTIAL/PLATELET

## 2022-01-18 LAB — LIPID PANEL

## 2022-01-19 NOTE — Progress Notes (Signed)
Established Patient Office Visit  Subjective   Patient ID: Sandra Mcknight, female    DOB: 04-15-1981  Age: 41 y.o. MRN: [id number]  Chief Complaint  Patient presents with   Hypertension   Prediabetes    HPI  Patient Active Problem List   Diagnosis Date Noted   Muscle spasm 03/31/2016   PROM (premature rupture of membranes) 02/19/2016   GBS (group B Streptococcus carrier), +RV culture, currently pregnant 02/12/2016   Migraine 10/04/2015   Gestational diabetes mellitus (GDM) affecting pregnancy, antepartum 09/23/2015   Rubella non-immune status, antepartum 09/19/2015   Obesity 03/31/2011   Past Medical History:  Diagnosis Date   Gestational diabetes    Hypertension    Meningitis    Obesity (BMI 35.0-39.9 without comorbidity)    Past Surgical History:  Procedure Laterality Date   NO PAST SURGERIES     Social History   Tobacco Use   Smoking status: Every Day    Packs/day: 0.50    Types: Cigarettes    Last attempt to quit: 06/23/2015    Years since quitting: 6.5   Smokeless tobacco: Former  Scientific laboratory technician Use: Never used  Substance Use Topics   Alcohol use: Yes    Comment: socially   Drug use: No   Family History  Problem Relation Age of Onset   Migraines Mother    Diabetes Mother    Migraines Daughter    Migraines Son    No Known Allergies    ROS    Objective:     BP (!) 138/93   Pulse 89   Resp 16   Wt 188 lb 3.2 oz (85.4 kg)   SpO2 100%   BMI 34.42 kg/m  BP Readings from Last 3 Encounters:  01/17/22 (!) 138/93  09/05/21 130/83  08/25/21 119/82      Physical Exam   Results for orders placed or performed in visit on 01/17/22  CBC with Differential  Result Value Ref Range   WBC CANCELED x10E3/uL  Lipid Panel  Result Value Ref Range   VLDL Cholesterol Cal CANCELED mg/dL  CMP14+EGFR  Result Value Ref Range   Glucose CANCELED mg/dL  Hemoglobin A1c  Result Value Ref Range   Hgb A1c MFr Bld CANCELED %  Microalbumin /  creatinine urine ratio  Result Value Ref Range   Creatinine, Urine 118.1 Not Estab. mg/dL   Microalbumin, Urine 9.7 Not Estab. ug/mL   Microalb/Creat Ratio 8 0 - 29 mg/g creat    Last CBC Lab Results  Component Value Date   WBC CANCELED 01/17/2022   HGB 11.6 (L) 06/23/2021   HCT 35.4 (L) 06/23/2021   MCV 86.8 06/23/2021   MCH 28.4 06/23/2021   RDW 17.1 (H) 06/23/2021   PLT 368 37/85/8850   Last metabolic panel Lab Results  Component Value Date   GLUCOSE CANCELED 01/17/2022   NA 139 06/23/2021   K 4.0 06/23/2021   CL 105 06/23/2021   CO2 24 06/23/2021   BUN 5 (L) 06/23/2021   CREATININE 0.68 06/23/2021   GFRNONAA >60 06/23/2021   CALCIUM 9.2 06/23/2021   PROT 7.4 04/06/2018   ALBUMIN 3.7 04/06/2018   BILITOT 0.6 04/06/2018   ALKPHOS 34 (L) 04/06/2018   AST 37 04/06/2018   ALT 30 04/06/2018   ANIONGAP 10 06/23/2021   Last lipids Lab Results  Component Value Date   CHOL 202 (H) 05/25/2021   HDL 51 05/25/2021   LDLCALC 130 (H) 05/25/2021   TRIG 118  05/25/2021   CHOLHDL 4.0 05/25/2021   Last hemoglobin A1c Lab Results  Component Value Date   HGBA1C CANCELED 01/17/2022      The 10-year ASCVD risk score (Arnett DK, et al., 2019) is: 12.3%    Assessment & Plan:   Problem List Items Addressed This Visit   Sandra Mcknight was seen today for hypertension and prediabetes.  Diagnoses and all orders for this visit:  Gastroesophageal reflux disease without esophagitis Discussed eating small frequent meal, reduction in acidic foods, fried foods ,spicy foods, alcohol caffeine and tobacco and certain medications. Avoid laying down after eating 97mns-1hour, elevated head of the bed.   Class 2 severe obesity due to excess calories with serious comorbidity in adult, unspecified BMI ([place] Obesity is 30-39 indicating an excess in caloric intake or underlining conditions. This may lead to other co-morbidities. Lifestyle modifications of diet and exercise may reduce obesity.   -      Lipid Panel  Prediabetes -     Lipid Panel -     Hemoglobin A1c -     Microalbumin / creatinine urine ratio  Essential hypertension BP goal - < 130/80 Explained that having normal blood pressure is the goal and medications are helping to get to goal and maintain normal blood pressure. DIET: Limit salt intake, read nutrition labels to check salt content, limit fried and high fatty foods  Avoid using multisymptom OTC cold preparations that generally contain sudafed which can rise BP. Consult with pharmacist on best cold relief products to use for persons with HTN EXERCISE Discussed incorporating exercise such as walking - 30 minutes most days of the week and can do in 10 minute intervals    -     CMP14+EGFR -     amLODipine (NORVASC) 5 MG tablet; Take 1 tablet (5 mg total) by mouth daily. -     hydrochlorothiazide (HYDRODIURIL) 25 MG tablet; Take 1 tablet (25 mg total) by mouth daily.  Tobacco abuse - I have recommended complete cessation of tobacco use. I have discussed various options available for assistance with tobacco cessation including over the counter methods (Nicotine gum, patch and lozenges). We also discussed prescription options (Chantix, Nicotine Inhaler / Nasal Spray). The patient is not interested in pursuing any prescription tobacco cessation options at this time. - Patient declines at this time.   Thyroid nodule -     TSH + free T4  Menorrhagia with regular cycle -     CBC with Differential  Need for hepatitis C screening test -     HCV Ab w Reflex to Quant PCR  Other orders -     omeprazole (PRILOSEC) 20 MG capsule; Take 1 capsule (20 mg total) by mouth daily.    Return in about 3 weeks (around 02/07/2022) for BP ck.    MKerin Perna NP

## 2022-01-20 ENCOUNTER — Ambulatory Visit: Payer: Medicaid Other | Attending: Internal Medicine

## 2022-01-20 ENCOUNTER — Encounter: Payer: Self-pay | Admitting: Neurology

## 2022-01-20 ENCOUNTER — Ambulatory Visit: Payer: Medicaid Other | Admitting: Neurology

## 2022-01-20 VITALS — BP 111/69 | HR 86 | Resp 18 | Ht 62.0 in | Wt 192.0 lb

## 2022-01-20 DIAGNOSIS — G43009 Migraine without aura, not intractable, without status migrainosus: Secondary | ICD-10-CM

## 2022-01-20 DIAGNOSIS — N920 Excessive and frequent menstruation with regular cycle: Secondary | ICD-10-CM | POA: Diagnosis not present

## 2022-01-20 DIAGNOSIS — E041 Nontoxic single thyroid nodule: Secondary | ICD-10-CM | POA: Diagnosis not present

## 2022-01-20 DIAGNOSIS — I10 Essential (primary) hypertension: Secondary | ICD-10-CM | POA: Diagnosis not present

## 2022-01-20 DIAGNOSIS — Z1159 Encounter for screening for other viral diseases: Secondary | ICD-10-CM | POA: Diagnosis not present

## 2022-01-20 MED ORDER — METOCLOPRAMIDE HCL 10 MG PO TABS: 10.0000 mg | ORAL_TABLET | Freq: Three times a day (TID) | ORAL | 5 refills | Status: DC | PRN

## 2022-01-20 MED ORDER — NORTRIPTYLINE HCL 10 MG PO CAPS: 10.0000 mg | ORAL_CAPSULE | Freq: Every day | ORAL | 5 refills | Status: DC

## 2022-01-20 MED ORDER — RIZATRIPTAN BENZOATE 10 MG PO TABS: 10.0000 mg | ORAL_TABLET | ORAL | 5 refills | Status: DC | PRN

## 2022-01-20 NOTE — Patient Instructions (Signed)
Nortriptyline at bedtime Rizatriptan and metoclopramide (for nausea) as needed when you get a migraine.  Limit use of pain relievers to no more than 2 days out of week to prevent risk of rebound or medication-overuse headache. Follow up 6 months.

## 2022-01-21 LAB — LIPID PANEL
Chol/HDL Ratio: 3.3 ratio (ref 0.0–4.4)
Cholesterol, Total: 166 mg/dL (ref 100–199)
HDL: 51 mg/dL
LDL Chol Calc (NIH): 100 mg/dL — ABNORMAL HIGH (ref 0–99)
Triglycerides: 81 mg/dL (ref 0–149)
VLDL Cholesterol Cal: 15 mg/dL (ref 5–40)

## 2022-01-21 LAB — CBC WITH DIFFERENTIAL/PLATELET
Basophils Absolute: 0 10*3/uL (ref 0.0–0.2)
Basos: 1 %
EOS (ABSOLUTE): 0.1 10*3/uL (ref 0.0–0.4)
Eos: 2 %
Hematocrit: 36.7 % (ref 34.0–46.6)
Hemoglobin: 12.4 g/dL (ref 11.1–15.9)
Immature Grans (Abs): 0 10*3/uL (ref 0.0–0.1)
Immature Granulocytes: 0 %
Lymphocytes Absolute: 1.7 10*3/uL (ref 0.7–3.1)
Lymphs: 27 %
MCH: 29.5 pg (ref 26.6–33.0)
MCHC: 33.8 g/dL (ref 31.5–35.7)
MCV: 87 fL (ref 79–97)
Monocytes Absolute: 0.4 10*3/uL (ref 0.1–0.9)
Monocytes: 6 %
Neutrophils Absolute: 4 10*3/uL (ref 1.4–7.0)
Neutrophils: 64 %
Platelets: 294 10*3/uL (ref 150–450)
RBC: 4.21 x10E6/uL (ref 3.77–5.28)
RDW: 16.9 % — ABNORMAL HIGH (ref 11.7–15.4)
WBC: 6.2 10*3/uL (ref 3.4–10.8)

## 2022-01-21 LAB — CMP14+EGFR
ALT: 23 IU/L (ref 0–32)
AST: 25 IU/L (ref 0–40)
Albumin/Globulin Ratio: 1.8 (ref 1.2–2.2)
Albumin: 3.9 g/dL (ref 3.9–4.9)
Alkaline Phosphatase: 50 IU/L (ref 44–121)
BUN/Creatinine Ratio: 8 — ABNORMAL LOW (ref 9–23)
BUN: 5 mg/dL — ABNORMAL LOW (ref 6–24)
Bilirubin Total: <0.2 mg/dL (ref 0.0–1.2)
CO2: 24 mmol/L (ref 20–29)
Calcium: 8.7 mg/dL (ref 8.7–10.2)
Chloride: 103 mmol/L (ref 96–106)
Creatinine, Ser: 0.62 mg/dL (ref 0.57–1.00)
Globulin, Total: 2.2 g/dL (ref 1.5–4.5)
Glucose: 83 mg/dL (ref 70–99)
Potassium: 3.9 mmol/L (ref 3.5–5.2)
Sodium: 141 mmol/L (ref 134–144)
Total Protein: 6.1 g/dL (ref 6.0–8.5)
eGFR: 115 mL/min/{1.73_m2} (ref >59)

## 2022-01-21 LAB — HCV AB W REFLEX TO QUANT PCR: HCV Ab: NONREACTIVE

## 2022-01-21 LAB — HEMOGLOBIN A1C
Est. average glucose Bld gHb Est-mCnc: 131 mg/dL
Hgb A1c MFr Bld: 6.2 % — ABNORMAL HIGH (ref 4.8–5.6)

## 2022-01-21 LAB — TSH+FREE T4
Free T4: 0.98 ng/dL (ref 0.82–1.77)
TSH: 1.55 u[IU]/mL (ref 0.450–4.500)

## 2022-01-21 LAB — HCV INTERPRETATION

## 2022-02-07 ENCOUNTER — Ambulatory Visit (INDEPENDENT_AMBULATORY_CARE_PROVIDER_SITE_OTHER): Payer: Medicaid Other | Admitting: Primary Care

## 2022-02-07 ENCOUNTER — Telehealth (INDEPENDENT_AMBULATORY_CARE_PROVIDER_SITE_OTHER): Payer: Self-pay | Admitting: Primary Care

## 2022-02-07 NOTE — Telephone Encounter (Signed)
Copied from [place] [phone]. Topic: General - Other >> Feb 07, 2022  9:36 AM Leilani Able wrote: Pt cancelled appt today, wanted someone to tell Sharyn Lull that pt mother Darden Palmer is on life support at this time.

## 2022-03-21 ENCOUNTER — Encounter: Payer: Self-pay | Admitting: Neurology

## 2022-07-24 ENCOUNTER — Ambulatory Visit: Payer: Medicaid Other | Admitting: Neurology
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
## 2022-08-08 ENCOUNTER — Encounter: Payer: Self-pay | Admitting: Neurology

## 2022-08-08 ENCOUNTER — Ambulatory Visit: Payer: Medicaid Other | Admitting: Neurology

## 2022-11-03 ENCOUNTER — Ambulatory Visit
Admission: EM | Admit: 2022-11-03 | Discharge: 2022-11-03 | Disposition: A | Discharge status: Home / Self Care | Payer: Medicaid Other | Attending: Physician Assistant | Admitting: Physician Assistant

## 2022-11-03 DIAGNOSIS — H60391 Other infective otitis externa, right ear: Secondary | ICD-10-CM

## 2022-11-03 DIAGNOSIS — H9201 Otalgia, right ear: Secondary | ICD-10-CM

## 2022-11-03 DIAGNOSIS — H6121 Impacted cerumen, right ear: Secondary | ICD-10-CM | POA: Diagnosis not present

## 2022-11-03 MED ORDER — OFLOXACIN 0.3 % OT SOLN: 5.0000 [drp] | Freq: Two times a day (BID) | OTIC | 0 refills | Status: DC

## 2022-11-03 NOTE — Discharge Instructions (Signed)
I am glad you are feeling better after we got the wax out.  Use ofloxacin drops twice daily for 5 days.  Keep your ear facing upward for a few minutes after you apply the drops to let it go completely to the ear canal.  Do not place anything in your ears including earbuds, earplugs, Q-tips.  Do not submerge your head in water for at least 1 week.  If you have any worsening symptoms including increasing pain, drainage from the ear, fever, nausea, vomiting you should be seen immediately.

## 2022-11-03 NOTE — ED Provider Notes (Signed)
[facility]    CSN: [id number] Arrival date & time: 11/03/22  1336      History   Chief Complaint Chief Complaint  Patient presents with   Otalgia    HPI Sandra Mcknight is a 42 y.o. female.   Patient presents today with a 2-week history of intermittent right ear pain.  She reports that pain is rated 10 on a 0-10 pain scale, localized to right ear without radiation, described as sharp, worse with yawning/coughing/burping.  She reports that when symptoms began she was sitting on the porch and felt something fly into her ear.  She is wondering if this could have caused her symptoms.  She does not use earbuds, earplugs, Q-tips.  Denies any recent swimming or airplane travel.  Denies any illness including cough, congestion, fever, nausea, vomiting.  She denies history of recurrent ear infections.  Denies history of seasonal allergies and does not take antihistamines on a regular basis.  Denies any recent antibiotics or steroids.    Past Medical History:  Diagnosis Date   Gestational diabetes    Hypertension    Meningitis    Obesity (BMI 35.0-39.9 without comorbidity)     Patient Active Problem List   Diagnosis Date Noted   Muscle spasm 03/31/2016   PROM (premature rupture of membranes) 02/19/2016   GBS (group B Streptococcus carrier), +RV culture, currently pregnant 02/12/2016   Migraine 10/04/2015   Gestational diabetes mellitus (GDM) affecting pregnancy, antepartum 09/23/2015   Rubella non-immune status, antepartum 09/19/2015   Obesity 03/31/2011    Past Surgical History:  Procedure Laterality Date   NO PAST SURGERIES      OB History     Gravida  3   Para  3   Term  3   Preterm      AB      Living  3      SAB      IAB      Ectopic      Multiple  0   Live Births  3            Home Medications    Prior to Admission medications   Medication Sig Start Date End Date Taking? Authorizing Provider  ofloxacin (FLOXIN) 0.3 % OTIC  solution Place 5 drops into the right ear 2 (two) times daily. 11/03/22  Yes Sydnei Ohaver K, PA-C  amLODipine (NORVASC) 5 MG tablet Take 1 tablet (5 mg total) by mouth daily. 01/17/22   Grayce Sessions, NP  Aspirin-Salicylamide-Caffeine (BC HEADACHE POWDER PO) Take by mouth.    [provider]  hydrochlorothiazide (HYDRODIURIL) 25 MG tablet Take 1 tablet (25 mg total) by mouth daily. 01/17/22   Grayce Sessions, NP  ibuprofen (ADVIL) 600 MG tablet Take 1 tablet (600 mg total) by mouth every 6 (six) hours as needed. 08/04/21   Domenick Gong, MD  metoCLOPramide (REGLAN) 10 MG tablet Take 1 tablet (10 mg total) by mouth every 8 (eight) hours as needed for nausea. 01/20/22   Drema Dallas, DO  nortriptyline (PAMELOR) 10 MG capsule Take 1 capsule (10 mg total) by mouth at bedtime. 01/20/22   Drema Dallas, DO  omeprazole (PRILOSEC) 20 MG capsule Take 1 capsule (20 mg total) by mouth daily. 01/17/22   Grayce Sessions, NP  rizatriptan (MAXALT) 10 MG tablet Take 1 tablet (10 mg total) by mouth as needed for migraine (May repeat after 2 hours.  Maximum 2 tablets in 24 hours.). May repeat  in 2 hours if needed 01/20/22   Drema Dallas, DO    Family History Family History  Problem Relation Age of Onset   Migraines Mother    Diabetes Mother    Migraines Daughter    Migraines Son     Social History Social History   Tobacco Use   Smoking status: Every Day    Current packs/day: 0.00    Types: Cigarettes    Last attempt to quit: 06/23/2015    Years since quitting: 7.3   Smokeless tobacco: Former  Building services engineer status: Never Used  Substance Use Topics   Alcohol use: Yes    Comment: socially   Drug use: No     Allergies   Patient has no known allergies.   Review of Systems Review of Systems  Constitutional:  Positive for activity change. Negative for appetite change, fatigue and fever.  HENT:  Positive for ear pain. Negative for congestion, ear discharge, sinus  pressure, sneezing and sore throat.   Respiratory:  Negative for cough and shortness of breath.   Cardiovascular:  Negative for chest pain.  Gastrointestinal:  Negative for abdominal pain, diarrhea, nausea and vomiting.     Physical Exam Triage Vital Signs ED Triage Vitals  Encounter Vitals Group     BP 11/03/22 1543 (!) 149/93     Systolic BP Percentile --      Diastolic BP Percentile --      Pulse Rate 11/03/22 1543 78     Resp 11/03/22 1543 16     Temp 11/03/22 1543 98.8 F (37.1 C)     Temp Source 11/03/22 1543 Oral     SpO2 11/03/22 1543 98 %     Weight --      Height --      Head Circumference --      Peak Flow --      Pain Score 11/03/22 1544 10     Pain Loc --      Pain Education --      Exclude from Growth Chart --    No data found.  Updated Vital Signs BP (!) 149/93 (BP Location: Right Arm)   Pulse 78   Temp 98.8 F (37.1 C) (Oral)   Resp 16   LMP 10/16/2022   SpO2 98%   Visual Acuity Right Eye Distance:   Left Eye Distance:   Bilateral Distance:    Right Eye Near:   Left Eye Near:    Bilateral Near:     Physical Exam Vitals reviewed.  Constitutional:      General: She is awake. She is not in acute distress.    Appearance: Normal appearance. She is well-developed. She is not ill-appearing.     Comments: Very pleasant female appears stated age in no acute distress sitting comfortably in exam room  HENT:     Head: Normocephalic and atraumatic.     Right Ear: Tympanic membrane, ear canal and external ear normal. Tenderness present. There is impacted cerumen. Tympanic membrane is not erythematous or bulging.     Left Ear: Tympanic membrane, ear canal and external ear normal. There is impacted cerumen. Tympanic membrane is not erythematous or bulging.     Ears:     Comments: Right ear: Partial cerumen impaction noted on right able to visualize approximately 30% of TM that appears normal.  This resolved with in office irrigation revealing normal TM but  erythematous canal.  Mild tenderness to palpation with palpation of  tragus and manipulation of external ear.  Left ear: Partial cerumen impaction noted on left.  Able to visualize approximately 10% of TM that appears normal.     Nose:     Right Sinus: No maxillary sinus tenderness or frontal sinus tenderness.     Left Sinus: No maxillary sinus tenderness or frontal sinus tenderness.     Mouth/Throat:     Pharynx: Uvula midline. No oropharyngeal exudate or posterior oropharyngeal erythema.  Cardiovascular:     Rate and Rhythm: Normal rate and regular rhythm.     Heart sounds: Normal heart sounds, S1 normal and S2 normal. No murmur heard. Pulmonary:     Effort: Pulmonary effort is normal.     Breath sounds: Normal breath sounds. No wheezing, rhonchi or rales.     Comments: Clear to auscultation bilaterally Psychiatric:        Behavior: Behavior is cooperative.      UC Treatments / Results  Labs (all labs ordered are listed, but only abnormal results are displayed) Labs Reviewed - No data to display  EKG   Radiology No results found.  Procedures Procedures (including critical care time)  Medications Ordered in UC Medications - No data to display  Initial Impression / Assessment and Plan / UC Course  I have reviewed the triage vital signs and the nursing notes.  Pertinent labs & imaging results that were available during my care of the patient were reviewed by me and considered in my medical decision making (see chart for details).     Patient is well-appearing, afebrile, nontoxic, nontachycardic.  Cerumen impaction resolved with in office irrigation revealing erythematous canal with normal-appearing TM.  I suspect this is irritation from the process of irrigation, however, will cover with ofloxacin as she did also have some tenderness to palpation of the tragus.  She was encouraged to use Tylenol and ibuprofen for pain relief.  Recommended she avoid submerging her head in  water for the next week and not to place anything in the ear canal until symptoms resolve.  We discussed that if she has any worsening symptoms including increasing pain, drainage from the ear, fever, nausea, vomiting she should be seen immediately.  Strict return precautions given.  All questions answered to patient satisfaction.  Final Clinical Impressions(s) / UC Diagnoses   Final diagnoses:  Infective otitis externa of right ear  Otalgia, right  Impacted cerumen of right ear     Discharge Instructions      I am glad you are feeling better after we got the wax out.  Use ofloxacin drops twice daily for 5 days.  Keep your ear facing upward for a few minutes after you apply the drops to let it go completely to the ear canal.  Do not place anything in your ears including earbuds, earplugs, Q-tips.  Do not submerge your head in water for at least 1 week.  If you have any worsening symptoms including increasing pain, drainage from the ear, fever, nausea, vomiting you should be seen immediately.     ED Prescriptions     Medication Sig Dispense Auth. Provider   ofloxacin (FLOXIN) 0.3 % OTIC solution Place 5 drops into the right ear 2 (two) times daily. 5 mL Daria Mcmeekin K, PA-C      PDMP not reviewed this encounter.   Jeani Hawking, PA-C 11/03/22 1635

## 2022-11-03 NOTE — ED Triage Notes (Signed)
Pt c/o right earache x 2 weeks-last tylenol last night-NAD-steady gait

## 2023-03-06 ENCOUNTER — Other Ambulatory Visit: Payer: Self-pay | Admitting: Neurology

## 2023-03-06 ENCOUNTER — Other Ambulatory Visit (INDEPENDENT_AMBULATORY_CARE_PROVIDER_SITE_OTHER): Payer: Self-pay | Admitting: Primary Care

## 2023-03-06 DIAGNOSIS — I10 Essential (primary) hypertension: Secondary | ICD-10-CM

## 2023-03-06 DIAGNOSIS — R7303 Prediabetes: Secondary | ICD-10-CM

## 2023-03-21 ENCOUNTER — Ambulatory Visit (INDEPENDENT_AMBULATORY_CARE_PROVIDER_SITE_OTHER): Payer: Self-pay

## 2023-03-21 ENCOUNTER — Encounter (INDEPENDENT_AMBULATORY_CARE_PROVIDER_SITE_OTHER): Payer: Self-pay | Admitting: Primary Care

## 2023-03-21 ENCOUNTER — Ambulatory Visit (INDEPENDENT_AMBULATORY_CARE_PROVIDER_SITE_OTHER): Payer: Medicaid Other | Admitting: Primary Care

## 2023-03-21 VITALS — BP 143/92 | HR 87 | Resp 16 | Ht 62.0 in | Wt 186.6 lb

## 2023-03-21 DIAGNOSIS — E66812 Obesity, class 2: Secondary | ICD-10-CM | POA: Diagnosis not present

## 2023-03-21 DIAGNOSIS — Z23 Encounter for immunization: Secondary | ICD-10-CM | POA: Diagnosis not present

## 2023-03-21 DIAGNOSIS — I10 Essential (primary) hypertension: Secondary | ICD-10-CM

## 2023-03-21 DIAGNOSIS — G43709 Chronic migraine without aura, not intractable, without status migrainosus: Secondary | ICD-10-CM | POA: Diagnosis not present

## 2023-03-21 DIAGNOSIS — R7303 Prediabetes: Secondary | ICD-10-CM

## 2023-03-21 DIAGNOSIS — Z6834 Body mass index (BMI) 34.0-34.9, adult: Secondary | ICD-10-CM | POA: Diagnosis not present

## 2023-03-21 MED ORDER — AMLODIPINE BESYLATE 5 MG PO TABS
5.0000 mg | ORAL_TABLET | Freq: Every day | ORAL | 0 refills | Status: DC
Start: 2023-03-21 — End: 2023-03-21

## 2023-03-21 MED ORDER — AMLODIPINE BESYLATE 10 MG PO TABS: 10.0000 mg | ORAL_TABLET | Freq: Every day | ORAL | 1 refills | Status: AC

## 2023-03-21 NOTE — Patient Instructions (Signed)

## 2023-03-21 NOTE — Progress Notes (Signed)
[facility]  Sandra Mcknight, is a 42 y.o. female  AOZ:[id number]  NGE:[id number]  DOB - Dec 22, 1980  Chief Complaint  Patient presents with   Hypertension    Pt states when she ran out of the amlodipine 3 weeks ago and since then she has been taking just the hydrochlorothiazide and her chest has been hurting since taking the hydrochlorothiazide continuously        Subjective:   Sandra Mcknight is a 42 y.o. female here today for a follow up visit. Patient has No headache, No chest pain, No abdominal pain - No Nausea, No new weakness tingling or numbness, No Cough - shortness of breath Hypertension This is a chronic problem. The current episode started more than 1 year ago. The problem has been gradually worsening since onset. The problem is uncontrolled. Associated symptoms include anxiety, headaches and shortness of breath. Past treatments include calcium channel blockers. Compliance problems include exercise.     No problems updated.  Comprehensive ROS Pertinent positive and negative noted in HPI   No Known Allergies  Past Medical History:  Diagnosis Date   Gestational diabetes    Hypertension    Meningitis    Obesity (BMI 35.0-39.9 without comorbidity)     Current Outpatient Medications on File Prior to Visit  Medication Sig Dispense Refill   Aspirin-Salicylamide-Caffeine (BC HEADACHE POWDER PO) Take by mouth.     hydrochlorothiazide (HYDRODIURIL) 25 MG tablet Take 1 tablet (25 mg total) by mouth daily. 90 tablet 3   ibuprofen (ADVIL) 600 MG tablet Take 1 tablet (600 mg total) by mouth every 6 (six) hours as needed. 30 tablet 0   metoCLOPramide (REGLAN) 10 MG tablet Take 1 tablet (10 mg total) by mouth every 8 (eight) hours as needed for nausea. 20 tablet 5   nortriptyline (PAMELOR) 10 MG capsule Take 1 capsule (10 mg total) by mouth at bedtime. 30 capsule 5   ofloxacin (FLOXIN) 0.3 % OTIC solution Place 5 drops into the right ear 2 (two) times daily.  5 mL 0   omeprazole (PRILOSEC) 20 MG capsule Take 1 capsule (20 mg total) by mouth daily. 30 capsule 3   rizatriptan (MAXALT) 10 MG tablet Take 1 tablet (10 mg total) by mouth as needed for migraine (May repeat after 2 hours.  Maximum 2 tablets in 24 hours.). May repeat in 2 hours if needed 10 tablet 5   No current facility-administered medications on file prior to visit.   Health Maintenance  Topic Date Due   Complete foot exam   Never done   Eye exam for diabetics  Never done   Pap with HPV screening  09/15/2018   Hemoglobin A1C  07/22/2022   COVID-19 Vaccine (1 - 2023-24 season) Never done   Yearly kidney health urinalysis for diabetes  01/18/2023   Yearly kidney function blood test for diabetes  01/21/2023   DTaP/Tdap/Td vaccine (2 - Td or Tdap) 12/29/2025   Flu Shot  Completed   Hepatitis C Screening  Completed   HIV Screening  Completed   HPV Vaccine  Aged Out    Objective:   Vitals:   03/21/23 1410 03/21/23 1411  BP: (!) 154/85 (!) 143/92  Pulse: 87   Resp: 16   SpO2: 97%   Weight: 186 lb 9.6 oz (84.6 kg)   Height: 5\' 2"  (1.575 m)    BP Readings from Last 3 Encounters:  03/21/23 (!) 143/92  11/03/22 (!) 149/93  01/20/22 111/69  Physical Exam Vitals reviewed.  Constitutional:      Appearance: She is obese.  HENT:     Head: Normocephalic.     Right Ear: There is impacted cerumen.     Left Ear: Tympanic membrane, ear canal and external ear normal.     Nose: Nose normal.     Mouth/Throat:     Mouth: Mucous membranes are moist.  Eyes:     Extraocular Movements: Extraocular movements intact.     Pupils: Pupils are equal, round, and reactive to light.  Cardiovascular:     Rate and Rhythm: Normal rate.  Pulmonary:     Effort: Pulmonary effort is normal.     Breath sounds: Normal breath sounds.  Abdominal:     General: Bowel sounds are normal.     Palpations: Abdomen is soft.  Musculoskeletal:        General: Normal range of motion.     Cervical back:  Normal range of motion.  Skin:    General: Skin is warm and dry.  Neurological:     Mental Status: She is alert and oriented to person, place, and time.  Psychiatric:        Mood and Affect: Mood normal.     Last CBC Lab Results  Component Value Date   WBC 6.2 01/20/2022   HGB 12.4 01/20/2022   HCT 36.7 01/20/2022   MCV 87 01/20/2022   MCH 29.5 01/20/2022   RDW 16.9 (H) 01/20/2022   PLT 294 01/20/2022   Last metabolic panel Lab Results  Component Value Date   GLUCOSE 83 01/20/2022   NA 141 01/20/2022   K 3.9 01/20/2022   CL 103 01/20/2022   CO2 24 01/20/2022   BUN 5 (L) 01/20/2022   CREATININE 0.62 01/20/2022   EGFR 115 01/20/2022   CALCIUM 8.7 01/20/2022   PROT 6.1 01/20/2022   ALBUMIN 3.9 01/20/2022   LABGLOB 2.2 01/20/2022   AGRATIO 1.8 01/20/2022   BILITOT <0.2 01/20/2022   ALKPHOS 50 01/20/2022   AST 25 01/20/2022   ALT 23 01/20/2022   ANIONGAP 10 06/23/2021   Last lipids Lab Results  Component Value Date   CHOL 166 01/20/2022   HDL 51 01/20/2022   LDLCALC 100 (H) 01/20/2022   TRIG 81 01/20/2022   CHOLHDL 3.3 01/20/2022   Last hemoglobin A1c Lab Results  Component Value Date   HGBA1C 6.2 (H) 01/20/2022      Assessment & Plan   Sandra Mcknight was seen today for hypertension.  Diagnoses and all orders for this visit:  Encounter for immunization -     Flu vaccine trivalent PF, 6mos and older(Flulaval,Afluria,Fluarix,Fluzone)  Essential hypertension -     CBC with Differential/Platelet -     CMP14+EGFR -     Discontinue: amLODipine (NORVASC) 5 MG tablet; Take 1 tablet (5 mg total) by mouth daily.  Prediabetes -     CBC with Differential/Platelet -     Hemoglobin A1c -     Lipid panel -     Microalbumin / creatinine urine ratio  Chronic migraine without aura without status migrainosus, not intractable Followed by neurology   Class 2 severe obesity due to excess calories with serious comorbidity in adult, unspecified BMI (HCC) Obesity is 30-39  indicating an excess in caloric intake or underlining conditions. This may lead to other co-morbidities. Educated on lifestyle modifications of diet and exercise which may reduce obesity.   -     Lipid panel  Other orders -  amLODipine (NORVASC) 10 MG tablet; Take 1 tablet (10 mg total) by mouth daily.     Patient have been counseled extensively about nutrition and exercise. Other issues discussed during this visit include: low cholesterol diet, weight control and daily exercise, foot care, annual eye examinations at Ophthalmology, importance of adherence with medications and regular follow-up. We also discussed long term complications of uncontrolled diabetes and hypertension.   Return in about 3 months (around 06/19/2023).  The patient was given clear instructions to go to ER or return to medical center if symptoms don't improve, worsen or new problems develop. The patient verbalized understanding. The patient was told to call to get lab results if they haven't heard anything in the next week.   This note has been created with Education officer, environmental. Any transcriptional errors are unintentional.   Grayce Sessions, NP 03/21/2023, 2:31 PM

## 2023-03-21 NOTE — Telephone Encounter (Signed)
Additional Notes: [facility] called and spoke to Tracy, [facility] about the refill(s) amlodipine. She says the have both 5mg  and 10 mg. Advised per OV notes today, dosage increased to 10 mg. She says she will cancel out the 5 mg.  Summary: pharmacy needs medication clarification   Pharmacy stated they received two Rx for the medication amLODipine (NORVASC) and they need clarification as to what strength she is taking, 5 mg or 10 mg.  Seeking clinical advice.     Reason for Disposition  Pharmacy calling with prescription question and triager answers question  Answer Assessment - Initial Assessment Questions 1. NAME of MEDICINE: "What medicine(s) are you calling about?"     Amlodipine 2. QUESTION: "What is your question?" (e.g., double dose of medicine, side effect)     Which dosage 10 mg or 5 mg, received both Rx 3. PRESCRIBER: "Who prescribed the medicine?" Reason: if prescribed by specialist, call should be referred to that group.     Gwinda Passe, NP  Protocols used: Medication Question Call-A-AH
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
## 2023-03-23 ENCOUNTER — Encounter: Payer: Self-pay | Admitting: Neurology

## 2023-03-23 ENCOUNTER — Ambulatory Visit: Payer: Medicaid Other | Admitting: Neurology

## 2023-03-23 ENCOUNTER — Other Ambulatory Visit (INDEPENDENT_AMBULATORY_CARE_PROVIDER_SITE_OTHER): Payer: Medicaid Other

## 2023-03-23 VITALS — BP 117/81 | HR 101 | Ht 62.0 in | Wt 187.0 lb

## 2023-03-23 DIAGNOSIS — I10 Essential (primary) hypertension: Secondary | ICD-10-CM | POA: Diagnosis not present

## 2023-03-23 DIAGNOSIS — G43009 Migraine without aura, not intractable, without status migrainosus: Secondary | ICD-10-CM | POA: Diagnosis not present

## 2023-03-23 DIAGNOSIS — R7303 Prediabetes: Secondary | ICD-10-CM | POA: Diagnosis not present

## 2023-03-23 DIAGNOSIS — E66812 Obesity, class 2: Secondary | ICD-10-CM | POA: Diagnosis not present

## 2023-03-23 MED ORDER — NORTRIPTYLINE HCL 10 MG PO CAPS: 10.0000 mg | ORAL_CAPSULE | Freq: Every day | ORAL | 5 refills | Status: DC

## 2023-03-23 MED ORDER — RIZATRIPTAN BENZOATE 10 MG PO TABS: 10.0000 mg | ORAL_TABLET | ORAL | 5 refills | Status: DC | PRN

## 2023-03-26 LAB — CMP14+EGFR
AST: 33 [IU]/L (ref 0–40)
Albumin: 4 g/dL (ref 3.9–4.9)
Alkaline Phosphatase: 53 [IU]/L (ref 44–40)
BUN/Creatinine Ratio: 8 — ABNORMAL LOW (ref 9–23)
BUN: 6 mg/dL (ref 6–24)
Bilirubin Total: <53 mg/dL (ref 44–121)
CO2: 24 mmol/L (ref 20–29)
Calcium: 8.3 mg/dL — ABNORMAL LOW (ref 8.7–10.2)
Chloride: 104 mmol/L (ref 96–106)
Creatinine, Ser: 0.73 mg/dL (ref 0.57–1.00)
Globulin, Total: 2.2 g/dL (ref 1.5–4.5)
Glucose: 90 mg/dL (ref 70–99)
Potassium: 4 mmol/L (ref 3.5–5.2)
Sodium: 143 mmol/L (ref 134–144)
Total Protein: 6.2 g/dL (ref 6.0–8.5)
eGFR: 105 mL/min/{1.73_m2} (ref >59)

## 2023-03-26 LAB — CBC WITH DIFFERENTIAL/PLATELET
Basophils Absolute: 0 10*3/uL (ref 0.0–0.2)
Basos: 0 %
EOS (ABSOLUTE): 0.2 10*3/uL (ref 0.0–0.4)
Eos: 2 %
Hematocrit: 37.6 % (ref 34.0–46.6)
Hemoglobin: 12.3 g/dL (ref 11.1–15.9)
Immature Grans (Abs): 0 10*3/uL (ref 0.0–0.1)
Immature Granulocytes: 0 %
Lymphocytes Absolute: 2.1 10*3/uL (ref 0.7–3.1)
Lymphs: 26 %
MCH: 30.4 pg (ref 26.6–33.0)
MCHC: 32.7 g/dL (ref 31.5–35.7)
MCV: 93 fL (ref 79–97)
Monocytes Absolute: 0.6 10*3/uL (ref 0.1–0.9)
Monocytes: 8 %
Neutrophils Absolute: 5.2 10*3/uL (ref 1.4–7.0)
Neutrophils: 64 %
Platelets: 377 10*3/uL (ref 150–450)
RBC: 4.04 x10E6/uL (ref 3.77–5.28)
RDW: 16.4 % — ABNORMAL HIGH (ref 11.7–15.4)
WBC: 8.2 10*3/uL (ref 3.4–10.8)

## 2023-03-26 LAB — LIPID PANEL
Chol/HDL Ratio: 3.7 {ratio} (ref 0.0–4.4)
Cholesterol, Total: 191 mg/dL (ref 100–199)
HDL: 51 mg/dL (ref >39)
LDL Chol Calc (NIH): 110 mg/dL — ABNORMAL HIGH (ref 0–99)
Triglycerides: 169 mg/dL — ABNORMAL HIGH (ref 0–149)
VLDL Cholesterol Cal: 30 mg/dL (ref 5–40)

## 2023-03-26 LAB — HEMOGLOBIN A1C
Est. average glucose Bld gHb Est-mCnc: 131 mg/dL
Hgb A1c MFr Bld: 6.2 % — ABNORMAL HIGH (ref 4.8–5.6)

## 2023-03-26 LAB — MICROALBUMIN / CREATININE URINE RATIO
Creatinine, Urine: 170.2 mg/dL
Microalb/Creat Ratio: 6 mg/g{creat} (ref 0–29)
Microalbumin, Urine: 10.8 ug/mL

## 2023-05-02 ENCOUNTER — Ambulatory Visit (INDEPENDENT_AMBULATORY_CARE_PROVIDER_SITE_OTHER): Payer: Self-pay | Admitting: Primary Care

## 2023-05-15 ENCOUNTER — Encounter (INDEPENDENT_AMBULATORY_CARE_PROVIDER_SITE_OTHER): Payer: Self-pay | Admitting: Primary Care

## 2023-05-15 ENCOUNTER — Other Ambulatory Visit (HOSPITAL_COMMUNITY)
Admission: RE | Admit: 2023-05-15 | Discharge: 2023-05-15 | Disposition: A | Discharge status: Home / Self Care | Payer: Medicaid Other | Source: Ambulatory Visit | Attending: Primary Care | Admitting: Primary Care

## 2023-05-15 ENCOUNTER — Ambulatory Visit (INDEPENDENT_AMBULATORY_CARE_PROVIDER_SITE_OTHER): Payer: Medicaid Other | Admitting: Primary Care

## 2023-05-15 VITALS — BP 130/86 | HR 106 | Resp 16 | Ht 62.0 in | Wt 181.0 lb

## 2023-05-15 DIAGNOSIS — Z113 Encounter for screening for infections with a predominantly sexual mode of transmission: Secondary | ICD-10-CM | POA: Insufficient documentation

## 2023-05-15 DIAGNOSIS — Z124 Encounter for screening for malignant neoplasm of cervix: Secondary | ICD-10-CM | POA: Insufficient documentation

## 2023-05-15 DIAGNOSIS — Z760 Encounter for issue of repeat prescription: Secondary | ICD-10-CM | POA: Diagnosis not present

## 2023-05-15 DIAGNOSIS — R1310 Dysphagia, unspecified: Secondary | ICD-10-CM

## 2023-05-15 DIAGNOSIS — I10 Essential (primary) hypertension: Secondary | ICD-10-CM | POA: Diagnosis not present

## 2023-05-15 DIAGNOSIS — K219 Gastro-esophageal reflux disease without esophagitis: Secondary | ICD-10-CM | POA: Diagnosis not present

## 2023-05-15 MED ORDER — HYDROCHLOROTHIAZIDE 12.5 MG PO TABS: 12.5000 mg | ORAL_TABLET | Freq: Every day | ORAL | 1 refills | Status: AC

## 2023-05-15 MED ORDER — METOCLOPRAMIDE HCL 10 MG PO TABS: 10.0000 mg | ORAL_TABLET | Freq: Three times a day (TID) | ORAL | 1 refills | Status: AC | PRN

## 2023-05-15 MED ORDER — OMEPRAZOLE 20 MG PO CPDR: 20.0000 mg | DELAYED_RELEASE_CAPSULE | Freq: Every day | ORAL | 3 refills | Status: DC

## 2023-05-15 NOTE — Progress Notes (Signed)
[facility]  WELL-WOMAN PHYSICAL & PAP Patient name: Sandra Mcknight MRN [id number]  Date of birth: 01-20-1981 Chief Complaint:   Gynecologic Exam  History of Present Illness:   Sandra Mcknight is a 43 y.o. G67P3003 female being seen today for a routine well-woman exam.   CC: none  The current method of family planning is none.  Patient's last menstrual period was 04/24/2023. Last pap  5/31/173 years . Results were: normal  Family h/o breast cancer: No Family h/o colorectal cancer: No  Health Maintenance  Topic Date Due   Pneumococcal Vaccination (1 of 2 - PCV) Never done   Complete foot exam   Never done   Eye exam for diabetics  Never done   Pap with HPV screening  09/15/2018   COVID-19 Vaccine (1 - 2024-25 season) Never done   Hemoglobin A1C  09/21/2023   Yearly kidney function blood test for diabetes  03/22/2024   Yearly kidney health urinalysis for diabetes  03/22/2024   DTaP/Tdap/Td vaccine (2 - Td or Tdap) 12/29/2025   Flu Shot  Completed   Hepatitis C Screening  Completed   HIV Screening  Completed   HPV Vaccine  Aged Out   Review of Systems:    Denies any headaches, blurred vision, fatigue, shortness of breath, chest pain, abdominal pain, abnormal vaginal discharge/itching/odor/irritation, problems with periods, bowel movements, urination, or intercourse unless otherwise stated above.  Pertinent History Reviewed:   Reviewed past medical,surgical, social and family history.  Reviewed problem list, medications and allergies.  Physical Assessment:   Vitals:   05/15/23 1057  BP: 130/86  Pulse: (!) 106  Resp: 16  SpO2: 97%  Weight: 181 lb (82.1 kg)  Height: 5\' 2"  (1.575 m)  Body mass index is 33.11 kg/m.        Physical Examination:  General appearance - well appearing, and in no distress Mental status - alert, oriented to person, place, and time Psych:  She has a normal mood and affect Skin - warm and dry, normal color, no suspicious  lesions noted Chest - effort normal, all lung fields clear to auscultation bilaterally Heart - normal rate and regular rhythm Neck:  midline trachea, no thyromegaly or nodules Breasts - breasts appear normal, no suspicious masses, no skin or nipple changes or axillary nodes Educated patient on proper self breast examination and had patient to demonstrate SBE. Abdomen - soft, nontender, nondistended, no masses or organomegaly Pelvic-VULVA: normal appearing vulva with no masses, tenderness or lesions   VAGINA: normal appearing vagina with normal color and discharge, no lesions   CERVIX: normal appearing cervix without discharge or lesions, no CMT UTERUS: uterus is felt to be normal size, shape, consistency and nontender  ADNEXA: No adnexal masses or tenderness noted. Extremities:  No swelling or varicosities noted  No results found for this or any previous visit (from the past 24 hours).   Assessment & Plan:  Princessa was seen today for gynecologic exam.  Diagnoses and all orders for this visit:  Screening for STD (sexually transmitted disease) -     Cervicovaginal ancillary only  Cervical cancer screening -     Cytology - PAP   Essential hypertension   She was not taking hydrochlorothiazide 25mg  only amlodipine previous Bp was better, BP goal - < 130/80 Explained that having normal blood pressure is the goal and medications are helping to get to goal and maintain normal blood pressure. DIET: Limit salt intake, read nutrition labels to check salt content,  limit fried and high fatty foods  Avoid using multisymptom OTC cold preparations that generally contain sudafed which can rise BP. Consult with pharmacist on best cold relief products to use for persons with HTN EXERCISE Discussed incorporating exercise such as walking - 30 minutes most days of the week and can do in 10 minute intervals    Add hydrochlorothiazide 12.5 with amlodipine 10mg  Bp ck 3 wks   Dysphagia, unspecified type  2/2 Gastroesophageal reflux disease without esophagitis    metoCLOPramide (REGLAN) 10 MG tablet; Take 1 tablet (10 mg total) by mouth every 8 (eight) hours as needed for nausea. -     omeprazole (PRILOSEC) 20 MG capsule; Take 1 capsule (20 mg total) by mouth daily.  -     Ambulatory referral to Gastroenterology  Other orders 2/2 Medication refill  -     hydrochlorothiazide (HYDRODIURIL) 12.5 MG tablet; Take 1 tablet (12.5 mg total) by mouth daily. -     metoCLOPramide (REGLAN) 10 MG tablet; Take 1 tablet (10 mg total) by mouth every 8 (eight) hours as needed for nausea. -     omeprazole (PRILOSEC) 20 MG capsule; Take 1 capsule (20 mg total) by mouth daily.    Meds:  Meds ordered this encounter  Medications   hydrochlorothiazide (HYDRODIURIL) 12.5 MG tablet    Sig: Take 1 tablet (12.5 mg total) by mouth daily.    Dispense:  90 tablet    Refill:  1    Supervising Provider:   Quentin Angst [[id number]]   metoCLOPramide (REGLAN) 10 MG tablet    Sig: Take 1 tablet (10 mg total) by mouth every 8 (eight) hours as needed for nausea.    Dispense:  30 tablet    Refill:  1    Supervising Provider:   Quentin Angst [[id number]]   omeprazole (PRILOSEC) 20 MG capsule    Sig: Take 1 capsule (20 mg total) by mouth daily.    Dispense:  30 capsule    Refill:  3    Supervising Provider:   Quentin Angst [[id number]]    Follow-up: Bp ck  This note has been created with Dragon speech recognition software and smart phrase technology. Any transcriptional errors are unintentional.   Grayce Sessions, NP 05/15/2023, 4:17 PM

## 2023-05-17 LAB — CERVICOVAGINAL ANCILLARY ONLY
Bacterial Vaginitis (gardnerella): POSITIVE — AB
Candida Glabrata: NEGATIVE
Candida Vaginitis: POSITIVE — AB
Chlamydia: NEGATIVE
Comment: NEGATIVE
Comment: NEGATIVE
Comment: NEGATIVE
Comment: NEGATIVE
Comment: NEGATIVE
Comment: NORMAL
Neisseria Gonorrhea: NEGATIVE
Trichomonas: NEGATIVE

## 2023-05-20 ENCOUNTER — Other Ambulatory Visit (INDEPENDENT_AMBULATORY_CARE_PROVIDER_SITE_OTHER): Payer: Self-pay | Admitting: Primary Care

## 2023-05-20 ENCOUNTER — Encounter (INDEPENDENT_AMBULATORY_CARE_PROVIDER_SITE_OTHER): Payer: Self-pay | Admitting: Primary Care

## 2023-05-20 DIAGNOSIS — B9689 Other specified bacterial agents as the cause of diseases classified elsewhere: Secondary | ICD-10-CM

## 2023-05-20 MED ORDER — FLUCONAZOLE 150 MG PO TABS
150.0000 mg | ORAL_TABLET | Freq: Every day | ORAL | 1 refills | Status: DC
Start: 2023-05-20 — End: 2023-07-04

## 2023-05-20 MED ORDER — METRONIDAZOLE 500 MG PO TABS
500.0000 mg | ORAL_TABLET | Freq: Two times a day (BID) | ORAL | 0 refills | Status: DC
Start: 2023-05-20 — End: 2023-06-09

## 2023-05-22 LAB — CYTOLOGY - PAP
Comment: NEGATIVE
Diagnosis: NEGATIVE
Diagnosis: REACTIVE
High risk HPV: NEGATIVE

## 2023-06-05 ENCOUNTER — Ambulatory Visit (INDEPENDENT_AMBULATORY_CARE_PROVIDER_SITE_OTHER): Payer: Medicaid Other

## 2023-06-09 ENCOUNTER — Other Ambulatory Visit: Payer: Self-pay

## 2023-06-09 ENCOUNTER — Encounter (HOSPITAL_BASED_OUTPATIENT_CLINIC_OR_DEPARTMENT_OTHER): Payer: Self-pay

## 2023-06-09 ENCOUNTER — Emergency Department (HOSPITAL_BASED_OUTPATIENT_CLINIC_OR_DEPARTMENT_OTHER)
Admission: EM | Admit: 2023-06-09 | Discharge: 2023-06-09 | Disposition: A | Discharge status: Home / Self Care | Payer: Medicaid Other

## 2023-06-09 DIAGNOSIS — Z79899 Other long term (current) drug therapy: Secondary | ICD-10-CM | POA: Insufficient documentation

## 2023-06-09 DIAGNOSIS — B9689 Other specified bacterial agents as the cause of diseases classified elsewhere: Secondary | ICD-10-CM

## 2023-06-09 DIAGNOSIS — L819 Disorder of pigmentation, unspecified: Secondary | ICD-10-CM | POA: Insufficient documentation

## 2023-06-09 DIAGNOSIS — I10 Essential (primary) hypertension: Secondary | ICD-10-CM | POA: Insufficient documentation

## 2023-06-09 DIAGNOSIS — Z7982 Long term (current) use of aspirin: Secondary | ICD-10-CM | POA: Insufficient documentation

## 2023-06-09 DIAGNOSIS — N760 Acute vaginitis: Secondary | ICD-10-CM | POA: Diagnosis not present

## 2023-06-09 DIAGNOSIS — M79641 Pain in right hand: Secondary | ICD-10-CM | POA: Insufficient documentation

## 2023-06-09 DIAGNOSIS — L814 Other melanin hyperpigmentation: Secondary | ICD-10-CM | POA: Diagnosis not present

## 2023-06-09 LAB — CBC WITH DIFFERENTIAL/PLATELET
Abs Immature Granulocytes: 0.04 10*3/uL (ref 0.00–0.07)
Basophils Absolute: 0 10*3/uL (ref 0.0–0.1)
Basophils Relative: 0 %
Eosinophils Absolute: 0.2 10*3/uL (ref 0.0–0.5)
Eosinophils Relative: 1 %
HCT: 39.9 % (ref 36.0–46.0)
Hemoglobin: 13.5 g/dL (ref 12.0–15.0)
Immature Granulocytes: 0 %
Lymphocytes Relative: 25 %
Lymphs Abs: 2.8 10*3/uL (ref 0.7–4.0)
MCH: 30.6 pg (ref 26.0–34.0)
MCHC: 33.8 g/dL (ref 30.0–36.0)
MCV: 90.5 fL (ref 80.0–100.0)
Monocytes Absolute: 0.9 10*3/uL (ref 0.1–1.0)
Monocytes Relative: 8 %
Neutro Abs: 7.3 10*3/uL (ref 1.7–7.7)
Neutrophils Relative %: 66 %
Platelets: 350 10*3/uL (ref 150–400)
RBC: 4.41 MIL/uL (ref 3.87–5.11)
RDW: 17.1 % — ABNORMAL HIGH (ref 11.5–15.5)
WBC: 11.2 10*3/uL — ABNORMAL HIGH (ref 4.0–10.5)
nRBC: 0 % (ref 0.0–0.2)

## 2023-06-09 LAB — COMPREHENSIVE METABOLIC PANEL
ALT: 20 U/L (ref 0–44)
AST: 27 U/L (ref 15–41)
Albumin: 4.4 g/dL (ref 3.5–5.0)
Alkaline Phosphatase: 46 U/L (ref 38–126)
Anion gap: 10 (ref 5–15)
BUN: <5 mg/dL — ABNORMAL LOW (ref 6–20)
CO2: 25 mmol/L (ref 22–32)
Calcium: 9.7 mg/dL (ref 8.9–10.3)
Chloride: 102 mmol/L (ref 98–111)
Creatinine, Ser: 0.61 mg/dL (ref 0.44–1.00)
GFR, Estimated: >60 mL/min (ref >60)
Glucose, Bld: 90 mg/dL (ref 70–99)
Potassium: 4.2 mmol/L (ref 3.5–5.1)
Sodium: 137 mmol/L (ref 135–145)
Total Bilirubin: 0.3 mg/dL (ref 0.0–1.2)
Total Protein: 8.2 g/dL — ABNORMAL HIGH (ref 6.5–8.1)

## 2023-06-09 LAB — WET PREP, GENITAL
Sperm: NONE SEEN
Trich, Wet Prep: NONE SEEN
WBC, Wet Prep HPF POC: >=10 — AB (ref <10)
Yeast Wet Prep HPF POC: NONE SEEN

## 2023-06-09 LAB — HIV ANTIBODY (ROUTINE TESTING W REFLEX): HIV Screen 4th Generation wRfx: NONREACTIVE

## 2023-06-09 LAB — URIC ACID: Uric Acid, Serum: 4.4 mg/dL (ref 2.5–7.1)

## 2023-06-09 MED ORDER — METRONIDAZOLE 500 MG PO TABS: 500.0000 mg | ORAL_TABLET | Freq: Two times a day (BID) | ORAL | 0 refills | Status: AC

## 2023-06-09 MED ORDER — METRONIDAZOLE 500 MG PO TABS
500.0000 mg | ORAL_TABLET | Freq: Once | ORAL | Status: AC
  Administered 2023-06-09: 500 mg via ORAL
  Filled 2023-06-09: qty 1

## 2023-06-09 NOTE — ED Provider Notes (Signed)
 [place] EMERGENCY DEPARTMENT AT [facility] Provider Note   CSN: [id number] Arrival date & time: 06/09/23  0915     History  Chief Complaint  Patient presents with   Wrist Pain    Sandra Mcknight is a 43 y.o. female with history of migraines, hypertension, presents with concern for dark bumps that started appearing on her right hand and fingers 2 weeks ago.  Also reports numbness in all the fingers of her right hand and mild numbness in the fingers on her left hand.  Reports she also has some new pain in her right fingers and wrist. She denies any injury.  Denies any history of rheumatologic diseases.  She states she is sexually active but not concern for STIs.No chest pain, shortness of breath, recent illnesses, or fevers.   Wrist Pain       Home Medications Prior to Admission medications   Medication Sig Start Date End Date Taking? Authorizing Provider  metroNIDAZOLE (FLAGYL) 500 MG tablet Take 1 tablet (500 mg total) by mouth 2 (two) times daily for 7 days. 06/09/23 06/16/23 Yes Arabella Merles, PA-C  amLODipine (NORVASC) 10 MG tablet Take 1 tablet (10 mg total) by mouth daily. 03/21/23   Grayce Sessions, NP  Aspirin-Salicylamide-Caffeine (BC HEADACHE POWDER PO) Take by mouth.    [provider]  fluconazole (DIFLUCAN) 150 MG tablet Take 1 tablet (150 mg total) by mouth daily. 05/20/23   Grayce Sessions, NP  hydrochlorothiazide (HYDRODIURIL) 12.5 MG tablet Take 1 tablet (12.5 mg total) by mouth daily. 05/15/23   Grayce Sessions, NP  ibuprofen (ADVIL) 600 MG tablet Take 1 tablet (600 mg total) by mouth every 6 (six) hours as needed. 08/04/21   Domenick Gong, MD  metoCLOPramide (REGLAN) 10 MG tablet Take 1 tablet (10 mg total) by mouth every 8 (eight) hours as needed for nausea. 05/15/23   Grayce Sessions, NP  nortriptyline (PAMELOR) 10 MG capsule Take 1 capsule (10 mg total) by mouth at bedtime. 03/23/23   Drema Dallas, DO  omeprazole  (PRILOSEC) 20 MG capsule Take 1 capsule (20 mg total) by mouth daily. 05/15/23   Grayce Sessions, NP  rizatriptan (MAXALT) 10 MG tablet Take 1 tablet (10 mg total) by mouth as needed for migraine (May repeat after 2 hours.  Maximum 2 tablets in 24 hours.). May repeat in 2 hours if needed 03/23/23   Drema Dallas, DO      Allergies    Patient has no known allergies.    Review of Systems   Review of Systems  Skin:  Positive for color change.    Physical Exam Updated Vital Signs BP 107/68 (BP Location: Right Arm)   Pulse 92   Temp 98.3 F (36.8 C) (Oral)   Resp 16   Ht 5\' 2"  (1.575 m)   Wt 82.1 kg   LMP 05/30/2023 (Approximate)   SpO2 100%   BMI 33.11 kg/m  Physical Exam Vitals and nursing note reviewed.  Constitutional:      General: She is not in acute distress.    Appearance: She is well-developed.  HENT:     Head: Normocephalic and atraumatic.     Mouth/Throat:     Comments: No lesions or skin changes of the tongue or oral mucosa Eyes:     Conjunctiva/sclera: Conjunctivae normal.  Cardiovascular:     Rate and Rhythm: Regular rhythm. Tachycardia present.     Heart sounds: No murmur heard.    Comments:  2+ radial pulse bilaterally Pulmonary:     Effort: Pulmonary effort is normal. No respiratory distress.     Breath sounds: Normal breath sounds.  Abdominal:     Palpations: Abdomen is soft.     Tenderness: There is no abdominal tenderness.  Musculoskeletal:        General: No swelling.     Cervical back: Neck supple.     Comments: No obvious deformity, erythema, or edema of the hands bilaterally  Able to fully flex and extend at the wrist bilaterally, 1st through 5th digits MCP, PIP, DIPs bilaterally  Skin:    General: Skin is warm and dry.     Capillary Refill: Capillary refill takes less than 2 seconds.     Comments: Circular hyperpigmented macules on the right hand palm and digits ranging from of approximately 0.5 cm to 2 cm diameter in size  Neurological:      Mental Status: She is alert.     Comments: Diminished sensation to touch of the right hand 1st through 5th digits compared to left hand.  Diminished sensation does not follow median, radian, or ulnar nerve distribution  Numbness in the right hand not reproduced with Tinel's   5/5 strength with resisted elbow flexion and extension, wrist flexion and extension bilaterally  5/5 strength with resisted ankle plantarflexion dorsiflexion bilaterally  No focal neurologic deficits  Psychiatric:        Mood and Affect: Mood normal.            ED Results / Procedures / Treatments   Labs (all labs ordered are listed, but only abnormal results are displayed) Labs Reviewed  WET PREP, GENITAL - Abnormal; Notable for the following components:      Result Value   Clue Cells Wet Prep HPF POC PRESENT (*)    WBC, Wet Prep HPF POC >=10 (*)    All other components within normal limits  CBC WITH DIFFERENTIAL/PLATELET - Abnormal; Notable for the following components:   WBC 11.2 (*)    RDW 17.1 (*)    All other components within normal limits  COMPREHENSIVE METABOLIC PANEL - Abnormal; Notable for the following components:   BUN <5 (*)    Total Protein 8.2 (*)    All other components within normal limits  URIC ACID  RPR  HIV ANTIBODY (ROUTINE TESTING W REFLEX)  GC/CHLAMYDIA PROBE AMP ([place]) NOT AT [facility]  GC/CHLAMYDIA PROBE AMP ([place]) NOT AT [facility] [address] [facility]    EKG None  Radiology No results found.  Procedures Procedures    Medications Ordered in ED Medications  metroNIDAZOLE (FLAGYL) tablet 500 mg (has no administration in time range)    ED Course/ Medical Decision Making/ A&P Clinical Course as of 06/09/23 1421  Sat Jun 09, 2023  1212 Went to recheck patient, she now is reporting some vaginal discharge and is concerned about yeast infection.  Will do pelvic and wet prep GC swabs [AF]    Clinical Course User Index [AF] Arabella Merles, PA-C                                  Medical Decision Making Amount and/or Complexity of Data Reviewed Labs: ordered.     Differential diagnosis includes but is not limited to gonococcal arthritis, arthritis, syphilis, hyperpigmentation due to sun exposure, endocarditis, TIA, stroke  ED Course:  Patient overall well-appearing, stable vital signs.  She reports some hyperpigmented spots to  the right hand and some associated diffuse pain and numbness sensation in the right hand and fingers ongoing for past 2 weeks. No chest pain, shortness of breath, murmurs, or fever. Lower concern that these lesions could be related to endocarditis. Although she reports numbness to her right fingers, no concern for TIA or stroke at this time given symptoms ongoing for the 2 weeks, and no other neurological deficits.  No concern for flexor tenosynovitis as she is does not have any tenderness palpation of her 1st through 5th digits of the right hand and is able to fully flex and extend at the 1st through 5th MCPs, PIP, DIPs.  Considered gonococcal arthritis, but she denies any STI exposure.  I Ordered, and personally interpreted labs.  The pertinent results include:   CBC with leukocytosis of 11.2 CMP with no electrolyte abnormalities, no elevation LFTs or creatinine Wet prep positive for clue cells and white blood cells.  Negative for yeast and trichomoniasis. Gonorrhea and chlamydia swabs pending.  HIV and RPR pending Patient test positive for BV on wet prep.  Will treat with first dose of metronidazole here and send the rest of the prescription into her pharmacy.  We discussed that the results of her gonorrhea chlamydia, HIV, and syphilis testing are still pending today.  She denies any STI exposure, no indication for prophylactic treatment at this time.  Low concern for any other emergent pathology at this time.  She verbalized understanding for follow-up with her PCP within the next week to review these lab results and for further  evaluation.  Stable and appropriate for discharge home  I staffed this case with Dr. Maple Hudson who is in agreement with this plan  Impression: Bacterial vaginosis Hypopigmentation spots of the right hand  Disposition:  The patient was discharged home with instructions to follow-up with PCP within the next week to review her RPR, HIV, gonorrhea and chlamydia test results.  Follow with PCP for further management of her symptoms. Return precautions given.              Final Clinical Impression(s) / ED Diagnoses Final diagnoses:  Right hand pain  Hyperpigmentation  Bacterial vaginosis    Rx / DC Orders ED Discharge Orders          Ordered    metroNIDAZOLE (FLAGYL) 500 MG tablet  2 times daily        06/09/23 1412              Arabella Merles, PA-C 06/09/23 1421    Coral Spikes, DO 06/09/23 1611

## 2023-06-09 NOTE — ED Notes (Signed)
 Attempted IV x2, unsuccessful. Blood work collected and sent to the lab. Urine sent to lab for GC/Chlamydia.

## 2023-06-09 NOTE — Discharge Instructions (Addendum)
 As discussed, you tested positive for BV (bacterial vaginosis).  This is an overgrowth of the normal vaginal flora.  You are being treated for this infection with a antibiotic called metronidazole (Flagyl).  Please take this medication twice daily for the next 7 days.  You were given your first dose here today.  Follow-up with your PCP within the next week for review of your lab results here today.  Your gonorrhea and Chlamydia testing, syphilis testing, and HIV testing are still pending.  It is possible that if you have one of these infections, it could be causing the pain and abnormal bumps on your hand.  If these are negative, your PCP can do further testing to evaluate your symptoms.  Your blood counts were normal aside from a elevated white blood cell count.  This can indicate inflammation or infection in the body.  Your electrolytes, kidney, and liver function were normal today.  Please return to the ER for any inability to move your hand or wrist, unexplained fevers, chest pain, any other new or concerning symptoms.

## 2023-06-09 NOTE — ED Triage Notes (Signed)
 States woke up with right wrist pain.  Denies injury.  Onset 2 - 3 weeks for rash or "bumps" on fingers of same hand.  Also having numbness to right foot/ toes at night for the last two weeks.

## 2023-06-10 LAB — RPR: RPR Ser Ql: NONREACTIVE

## 2023-06-11 ENCOUNTER — Telehealth (INDEPENDENT_AMBULATORY_CARE_PROVIDER_SITE_OTHER): Payer: Self-pay | Admitting: Primary Care

## 2023-06-11 LAB — GC/CHLAMYDIA PROBE AMP (~~LOC~~) NOT AT ARMC
Chlamydia: NEGATIVE
Chlamydia: NEGATIVE
Comment: NEGATIVE
Comment: NORMAL
Comment: NEGATIVE
Comment: NORMAL
Neisseria Gonorrhea: NEGATIVE
Neisseria Gonorrhea: NEGATIVE

## 2023-06-11 NOTE — Telephone Encounter (Signed)
 Called pt to remind them about atp. VM left for pt.

## 2023-06-18 ENCOUNTER — Ambulatory Visit (INDEPENDENT_AMBULATORY_CARE_PROVIDER_SITE_OTHER): Payer: Self-pay | Admitting: Primary Care

## 2023-06-18 ENCOUNTER — Telehealth (INDEPENDENT_AMBULATORY_CARE_PROVIDER_SITE_OTHER): Payer: Self-pay | Admitting: Primary Care

## 2023-06-18 NOTE — Telephone Encounter (Signed)
 Called pt to reschedule missed app. Pt did not answer and could not leave VM.

## 2023-07-03 NOTE — Progress Notes (Unsigned)
 Chief Complaint:Dysphagia Primary GI Doctor:Dr. Leonides Schanz  HPI:  Patient is a 43 year old African American female patient with past medical history of gestational diabetes, hypertension, and obesity, who was referred to me by Grayce Sessions, NP on 05/15/2023 for a complaint of dysphagia.    Interval History    Patient presents with main complaint of esophageal dysphagia with nausea and weight loss. Patient has history of chronic GERD and currently taking Omeprazole 20 mg po daily. She has been on Omeprazole for several years. She reports if she misses a dose she will get severe pyrosis. She reports esophageal dysphagia for the past year with solids and carbonated liquids. She states things stop in the middle of her chest and come right back up. She states prior to these issues she weighed 215lbs, her appetite has decreased due to dysphagia. She has intermittent nausea and takes Reglan prn, she states she requires it about once a month. Patient denies altered bowel habits, abdominal pain, or rectal bleeding. Smokes daily, half a pack. Socially drinks. She does use BC powders daily for headaches. Pt denies dark tarry stools. Patient's family history includes mother with esophageal issues requiring dilatation and melanoma.  Wt Readings from Last 3 Encounters:  07/04/23 185 lb (83.9 kg)  06/09/23 181 lb (82.1 kg)  05/15/23 181 lb (82.1 kg)    Past Medical History:  Diagnosis Date   Gestational diabetes    Hypertension    Meningitis    Migraine    Obesity (BMI 35.0-39.9 without comorbidity)     Past Surgical History:  Procedure Laterality Date   NO PAST SURGERIES      Current Outpatient Medications  Medication Sig Dispense Refill   amLODipine (NORVASC) 10 MG tablet Take 1 tablet (10 mg total) by mouth daily. 90 tablet 1   Aspirin-Salicylamide-Caffeine (BC HEADACHE POWDER PO) Take by mouth. As needed     hydrochlorothiazide (HYDRODIURIL) 12.5 MG tablet Take 1 tablet (12.5 mg total) by  mouth daily. 90 tablet 1   ibuprofen (ADVIL) 600 MG tablet Take 1 tablet (600 mg total) by mouth every 6 (six) hours as needed. 30 tablet 0   metoCLOPramide (REGLAN) 10 MG tablet Take 1 tablet (10 mg total) by mouth every 8 (eight) hours as needed for nausea. 30 tablet 1   omeprazole (PRILOSEC) 20 MG capsule Take 1 capsule (20 mg total) by mouth daily. (Patient taking differently: Take 20 mg by mouth daily as needed.) 30 capsule 3   rizatriptan (MAXALT) 10 MG tablet Take 1 tablet (10 mg total) by mouth as needed for migraine (Lailanie Hasley repeat after 2 hours.  Maximum 2 tablets in 24 hours.). Flor Houdeshell repeat in 2 hours if needed 10 tablet 5   nortriptyline (PAMELOR) 10 MG capsule Take 1 capsule (10 mg total) by mouth at bedtime. (Patient not taking: Reported on 07/04/2023) 30 capsule 5   No current facility-administered medications for this visit.    Allergies as of 07/04/2023   (No Known Allergies)    Family History  Problem Relation Age of Onset   Migraines Mother    Diabetes Mother    Migraines Daughter    Migraines Son    Colon cancer Neg Hx    Esophageal cancer Neg Hx     Review of Systems:    Constitutional: No weight loss, fever, chills, weakness or fatigue HEENT: Eyes: No change in vision               Ears, Nose, Throat:  No change in hearing or congestion Skin: No rash or itching Cardiovascular: No chest pain, chest pressure or palpitations   Respiratory: No SOB or cough Gastrointestinal: See HPI and otherwise negative Genitourinary: No dysuria or change in urinary frequency Neurological: No headache, dizziness or syncope Musculoskeletal: No new muscle or joint pain Hematologic: No bleeding or bruising Psychiatric: No history of depression or anxiety    Physical Exam:  Vital signs: BP 124/64   Pulse 80   Ht 5\' 2"  (1.575 m)   Wt 185 lb (83.9 kg)   LMP 05/30/2023 (Approximate)   BMI 33.84 kg/m   Constitutional:   Pleasant African American female appears to be in NAD, Well  developed, Well nourished, alert and cooperative Throat: Oral cavity and pharynx without inflammation, swelling or lesion.  Respiratory: Respirations even and unlabored. Lungs clear to auscultation bilaterally.   No wheezes, crackles, or rhonchi.  Cardiovascular: Normal S1, S2. Regular rate and rhythm. No peripheral edema, cyanosis or pallor.  Gastrointestinal:  Soft, nondistended, nontender. Obese. No rebound or guarding. Normal bowel sounds. No appreciable masses or hepatomegaly. Rectal:  Not performed. Msk:  Symmetrical without gross deformities. Without edema, no deformity or joint abnormality.  Neurologic:  Alert and  oriented x4;  grossly normal neurologically.  Skin:   Dry and intact without significant lesions or rashes. Psychiatric: Oriented to person, place and time. Demonstrates good judgement and reason without abnormal affect or behaviors.  RELEVANT LABS AND IMAGING: CBC    Latest Ref Rng & Units 06/09/2023   10:28 AM 03/23/2023   11:34 AM 01/20/2022   11:05 AM  CBC  WBC 4.0 - 10.5 K/uL 11.2  8.2  6.2   Hemoglobin 12.0 - 15.0 g/dL 16.1  09.6  04.5   Hematocrit 36.0 - 46.0 % 39.9  37.6  36.7   Platelets 150 - 400 K/uL 350  377  294      CMP     Latest Ref Rng & Units 06/09/2023   10:28 AM 03/23/2023   11:34 AM 01/20/2022   11:05 AM  CMP  Glucose 70 - 99 mg/dL 90  90  83   BUN 6 - 20 mg/dL 5  6  5    Creatinine 0.44 - 1.00 mg/dL 4.09  8.11  9.14   Sodium 135 - 145 mmol/L 137  143  141   Potassium 3.5 - 5.1 mmol/L 4.2  4.0  3.9   Chloride 98 - 111 mmol/L 102  104  103   CO2 22 - 32 mmol/L 25  24  24    Calcium 8.9 - 10.3 mg/dL 9.7  8.3  8.7   Total Protein 6.5 - 8.1 g/dL 8.2  6.2  6.1   Total Bilirubin 0.0 - 1.2 mg/dL 0.3  <7.8  <2.9   Alkaline Phos 38 - 126 U/L 46  53  50   AST 15 - 41 U/L 27  33  25   ALT 0 - 44 U/L 20  15  23       Lab Results  Component Value Date   TSH 1.550 01/20/2022   Assessment: Encounter Diagnoses  Name Primary?   Gastroesophageal  reflux disease, unspecified whether esophagitis present Yes   Esophageal dysphagia    Nausea without vomiting    Loss of weight      42 year [place] American female patient with history of GERD who presents with worsening esophageal dysphagia, nausea, and weight loss over the course of the past year. Will schedule upper GI  endoscopy to rule out esophagitis, PUD, or stricture requiring dilatation. Potential etiologies include stricture, esophagitis ( infectious, eosinophilic, reflux), or esophageal dysmotility. I will also increase her omeprazole from 20 to 40 mg po daily along with reinforcing strict GERD diet. She also endorses using daily BC powders which increased the rick for peptic ulcer disease and gastropathy, will evaluate during endoscopy. Recommended she discontinue NSAID's and discuss alternatives with PCP.  Plan: - Increase Omeprazole 20 mg po daily to 40 mg po daily, RX sent -Educated on GERD diet, no late meals 3-4 hours before lying down -No NSAIDs. Advise she ask PCP for alternative for H/A -Dysphagia precautions given. -Schedule EGD with dilatation in [facility] with Dr. Leonides Schanz to evaluate for stenosis, tumor, erosive/infectious esophagititis, and EOE. If the EGD is negative and symptoms continue after PPI trial, can consider barium swallow. -I discussed risks of EGD with patient today, including risk of sedation, bleeding or perforation. Patient provides understanding and gave verbal consent to proceed.  Thank you for the courtesy of this consult. Please call me with any questions or concerns.   Ercell Razon, FNP-C [facility] 07/04/2023, 8:56 AM  Cc: Grayce Sessions, NP

## 2023-07-04 ENCOUNTER — Ambulatory Visit: Admitting: Gastroenterology

## 2023-07-04 ENCOUNTER — Encounter: Payer: Self-pay | Admitting: Gastroenterology

## 2023-07-04 VITALS — BP 124/64 | HR 80 | Ht 62.0 in | Wt 185.0 lb

## 2023-07-04 DIAGNOSIS — R1319 Other dysphagia: Secondary | ICD-10-CM

## 2023-07-04 DIAGNOSIS — K219 Gastro-esophageal reflux disease without esophagitis: Secondary | ICD-10-CM | POA: Diagnosis not present

## 2023-07-04 DIAGNOSIS — R634 Abnormal weight loss: Secondary | ICD-10-CM | POA: Diagnosis not present

## 2023-07-04 DIAGNOSIS — R110 Nausea: Secondary | ICD-10-CM | POA: Diagnosis not present

## 2023-07-04 DIAGNOSIS — R1310 Dysphagia, unspecified: Secondary | ICD-10-CM | POA: Diagnosis not present

## 2023-07-04 MED ORDER — OMEPRAZOLE 40 MG PO CPDR
40.0000 mg | DELAYED_RELEASE_CAPSULE | Freq: Every day | ORAL | 3 refills | Status: AC
Start: 2023-07-04 — End: ?

## 2023-07-04 NOTE — Patient Instructions (Addendum)
 Dysphagia precautions:  1. Take reflux medications 30+ minutes before food in the morning 2. Begin meals with warm beverage 3. Eat smaller more frequent meals 4. Eat slowly, taking small bites and sips 5. Alternate solids and liquids 6. Avoid foods/liquids that increase acid production 7. Sit upright during and for 30+ minutes after meals to facilitate esophageal clearing 8. All meats should be chopped finely.    If something gets hung in your esophagus and will not come up or go down, proceed to the emergency room.    Increased Omeprazole 20 mg po daily to 40 mg po daily, take 30 minutes before breakfast  No NSAIDs. Please discuss with PCP alternative medication for Headaches.   We have sent the following medications to your pharmacy for you to pick up at your convenience: Omeprazole 40mg  : 1 tablet daily  You have been scheduled for an endoscopy. Please follow written instructions given to you at your visit today.  If you use inhalers (even only as needed), please bring them with you on the day of your procedure.  If you take any of the following medications, they will need to be adjusted prior to your procedure:   DO NOT TAKE 7 DAYS PRIOR TO TEST- Trulicity (dulaglutide) Ozempic, Wegovy (semaglutide) Mounjaro (tirzepatide) Bydureon Bcise (exanatide extended release)  DO NOT TAKE 1 DAY PRIOR TO YOUR TEST Rybelsus (semaglutide) Adlyxin (lixisenatide) Victoza (liraglutide) Byetta (exanatide) ___________________________________________________________________________  Due to recent changes in healthcare laws, you may see the results of your imaging and laboratory studies on MyChart before your provider has had a chance to review them.  We understand that in some cases there may be results that are confusing or concerning to you. Not all laboratory results come back in the same time frame and the provider may be waiting for multiple results in order to interpret others.  Please  give [place] 48 hours in order for your provider to thoroughly review all the results before contacting the office for clarification of your results.  _______________________________________________________  If your blood pressure at your visit was 140/90 or greater, please contact your primary care physician to follow up on this.  _______________________________________________________  If you are age 64 or older, your body mass index should be between 23-30. Your Body mass index is 33.84 kg/m. If this is out of the aforementioned range listed, please consider follow up with your Primary Care Provider.  If you are age 53 or younger, your body mass index should be between 19-25. Your Body mass index is 33.84 kg/m. If this is out of the aformentioned range listed, please consider follow up with your Primary Care Provider.   ________________________________________________________  The [place] [facility] providers would like to encourage you to use [facility] to communicate with providers for non-urgent requests or questions.  Due to long hold times on the telephone, sending your provider a message by [facility] may be a faster and more efficient way to get a response.  Please allow 48 business hours for a response.  Please remember that this is for non-urgent requests.  _______________________________________________________ Thank you for trusting me with your gastrointestinal care!   Margarite Gouge May, NP

## 2023-07-04 NOTE — Progress Notes (Signed)
 I agree with the assessment and plan as outlined by Ms. May.

## 2023-07-11 ENCOUNTER — Telehealth: Payer: Self-pay | Admitting: Neurology

## 2023-07-11 NOTE — Telephone Encounter (Signed)
 Per patient the Rizatriptan and the Nortriptyline 10 mg not helping.    Per patient this is  recently that the medication  not helping.

## 2023-07-11 NOTE — Telephone Encounter (Signed)
 Pt called in stating she thinks her medication needs to be increased. The medicine has not been working for her

## 2023-07-12 NOTE — Telephone Encounter (Signed)
 LMOVM for patient as well sent a mychart message.

## 2023-07-19 ENCOUNTER — Encounter: Payer: Self-pay | Admitting: Certified Registered Nurse Anesthetist

## 2023-07-19 ENCOUNTER — Telehealth: Payer: Self-pay

## 2023-07-19 MED ORDER — NORTRIPTYLINE HCL 25 MG PO CAPS: 25.0000 mg | ORAL_CAPSULE | Freq: Every day | ORAL | 5 refills | Status: AC

## 2023-07-19 MED ORDER — ELETRIPTAN HYDROBROMIDE 40 MG PO TABS: 40.0000 mg | ORAL_TABLET | ORAL | 5 refills | Status: AC | PRN

## 2023-07-19 NOTE — Telephone Encounter (Signed)
 PA needed for Eletriptan

## 2023-07-19 NOTE — Telephone Encounter (Signed)
 Patient advised of Dr.Jaffe note, Would increase nortriptyline and prescribe a new triptan.  Please send in prescription for: 1. Nortriptyline 25mg  at bedtime, quantity 30, refills 5 2. Eletriptan 40mg  as needed.  May repeat after 2 hours.  Maximum 2 tablets in 24 hours.  Default quantity, refill 5    Patient agrees. Medication sent to [facility] per patient request.

## 2023-07-23 ENCOUNTER — Encounter: Payer: Self-pay | Admitting: Internal Medicine

## 2023-07-23 ENCOUNTER — Ambulatory Visit: Admitting: Internal Medicine

## 2023-07-23 VITALS — BP 145/95 | HR 108 | Temp 98.0°F | Resp 12 | Ht 62.0 in | Wt 185.0 lb

## 2023-07-23 DIAGNOSIS — K2950 Unspecified chronic gastritis without bleeding: Secondary | ICD-10-CM | POA: Diagnosis not present

## 2023-07-23 DIAGNOSIS — K219 Gastro-esophageal reflux disease without esophagitis: Secondary | ICD-10-CM

## 2023-07-23 DIAGNOSIS — K3189 Other diseases of stomach and duodenum: Secondary | ICD-10-CM | POA: Diagnosis not present

## 2023-07-23 DIAGNOSIS — R1319 Other dysphagia: Secondary | ICD-10-CM

## 2023-07-23 DIAGNOSIS — B9681 Helicobacter pylori [H. pylori] as the cause of diseases classified elsewhere: Secondary | ICD-10-CM

## 2023-07-23 DIAGNOSIS — K2980 Duodenitis without bleeding: Secondary | ICD-10-CM

## 2023-07-23 DIAGNOSIS — E669 Obesity, unspecified: Secondary | ICD-10-CM | POA: Diagnosis not present

## 2023-07-23 DIAGNOSIS — K269 Duodenal ulcer, unspecified as acute or chronic, without hemorrhage or perforation: Secondary | ICD-10-CM

## 2023-07-23 DIAGNOSIS — K222 Esophageal obstruction: Secondary | ICD-10-CM

## 2023-07-23 DIAGNOSIS — I10 Essential (primary) hypertension: Secondary | ICD-10-CM | POA: Diagnosis not present

## 2023-07-23 DIAGNOSIS — R110 Nausea: Secondary | ICD-10-CM | POA: Diagnosis not present

## 2023-07-23 DIAGNOSIS — K229 Disease of esophagus, unspecified: Secondary | ICD-10-CM | POA: Diagnosis not present

## 2023-07-23 DIAGNOSIS — R634 Abnormal weight loss: Secondary | ICD-10-CM

## 2023-07-23 DIAGNOSIS — R1310 Dysphagia, unspecified: Secondary | ICD-10-CM

## 2023-07-23 MED ORDER — SODIUM CHLORIDE 0.9 % IV SOLN: 500.0000 mL | Freq: Once | INTRAVENOUS | Status: DC

## 2023-07-23 MED ORDER — OMEPRAZOLE 40 MG PO CPDR: 40.0000 mg | DELAYED_RELEASE_CAPSULE | Freq: Two times a day (BID) | ORAL | 3 refills | Status: AC

## 2023-07-23 MED ORDER — SODIUM CHLORIDE 0.9 % IV SOLN
4.0000 mg | Freq: Once | INTRAVENOUS | Status: AC
  Administered 2023-07-23: 4 mg via INTRAVENOUS

## 2023-07-23 NOTE — Op Note (Signed)
 [facility] Patient Name: Jackqulyn Mendel Procedure Date: 07/23/2023 2:08 PM MRN: [id number] Endoscopist: Madelyn Brunner [place] , , [id number] Age: 43 Referring MD:  Date of Birth: 1980-09-06 Gender: Female Account #: [id number] Procedure:                Upper GI endoscopy Indications:              Dysphagia, Heartburn, Nausea, Weight loss Medicines:                Monitored Anesthesia Care Procedure:                Pre-Anesthesia Assessment:                           - Prior to the procedure, a History and Physical                            was performed, and patient medications and                            allergies were reviewed. The patient's tolerance of                            previous anesthesia was also reviewed. The risks                            and benefits of the procedure and the sedation                            options and risks were discussed with the patient.                            All questions were answered, and informed consent                            was obtained. Prior Anticoagulants: The patient has                            taken no anticoagulant or antiplatelet agents. ASA                            Grade Assessment: II - A patient with mild systemic                            disease. After reviewing the risks and benefits,                            the patient was deemed in satisfactory condition to                            undergo the procedure.                           After obtaining informed consent, the endoscope was  passed under direct vision. Throughout the                            procedure, the patient's blood pressure, pulse, and                            oxygen saturations were monitored continuously. The                            GIF [id number] #[id number] was introduced through the                            mouth, and advanced to the second part of duodenum.                            The  upper GI endoscopy was accomplished without                            difficulty. The patient tolerated the procedure                            well. Scope In: Scope Out: Findings:                 One benign-appearing, intrinsic moderate                            (circumferential scarring or stenosis; an endoscope                            may pass) stenosis was found in the distal                            esophagus. This stenosis measured 1 cm (in length).                            The stenosis was traversed. A TTS dilator was                            passed through the scope. Dilation with a 01-26-11                            mm balloon dilator was performed to 12 mm. The                            dilation site was examined and showed mild mucosal                            disruption.                           Biopsies were taken with a cold forceps in the                            proximal esophagus  and in the distal esophagus for                            histology.                           Retained fluid was found in the gastric body.                           Localized inflammation characterized by congestion                            (edema), erosions and erythema was found in the                            gastric antrum. Biopsies were taken with a cold                            forceps for Helicobacter pylori testing.                           Three non-bleeding cratered duodenal ulcers with a                            clean ulcer base (Forrest Class III) were found in                            the duodenal bulb. The largest lesion was 6 mm in                            largest dimension. Biopsies were taken with a cold                            forceps for histology. Complications:            No immediate complications. Estimated Blood Loss:     Estimated blood loss was minimal. Impression:               - Benign-appearing esophageal stenosis. Dilated.                            - Retained gastric fluid.                           - Gastritis. Biopsied.                           - Non-bleeding duodenal ulcers with a clean ulcer                            base (Forrest Class III). Biopsied.                           - Biopsies were taken with a cold forceps for  histology in the proximal esophagus and in the                            distal esophagus. Recommendation:           - Discharge patient to home (with escort).                           - Await pathology results.                           - Increase omeprazole to 40 mg twice daily.                           - Reduce NSAID use (including BC powder use).                           - Repeat upper endoscopy in 1 month for further                            esophageal dilation.                           - The findings and recommendations were discussed                            with the patient. Dr Particia Lather "Alan Ripper" Leonides Schanz,  07/23/2023 2:34:40 PM

## 2023-07-23 NOTE — Progress Notes (Signed)
 1410 BP 141/92, Labetalol given IV, MD update, vss

## 2023-07-23 NOTE — Progress Notes (Signed)
 Pt's states no medical or surgical changes since previsit or office visit.

## 2023-07-23 NOTE — Progress Notes (Signed)
 GASTROENTEROLOGY PROCEDURE H&P NOTE   Primary Care Physician: Grayce Sessions, NP    Reason for Procedure:   Dysphagia, nausea, and weight loss  Plan:    EGD  Patient is appropriate for endoscopic procedure(s) in the ambulatory ([facility]) setting.  The nature of the procedure, as well as the risks, benefits, and alternatives were carefully and thoroughly reviewed with the patient. Ample time for discussion and questions allowed. The patient understood, was satisfied, and agreed to proceed.     HPI: Sandra Mcknight is a 43 y.o. female who presents for EGD for evaluation of dysphagia, nausea, and weight loss.  Patient was most recently seen in the Gastroenterology Clinic on 07/04/23.  No interval change in medical history since that appointment. Please refer to that note for full details regarding GI history and clinical presentation.   Past Medical History:  Diagnosis Date   Gestational diabetes    Hypertension    Meningitis    Migraine    Obesity (BMI 35.0-39.9 without comorbidity)     Past Surgical History:  Procedure Laterality Date   NO PAST SURGERIES      Prior to Admission medications   Medication Sig Start Date End Date Taking? Authorizing Provider  amLODipine (NORVASC) 10 MG tablet Take 1 tablet (10 mg total) by mouth daily. 03/21/23   Grayce Sessions, NP  Aspirin-Salicylamide-Caffeine (BC HEADACHE POWDER PO) Take by mouth. As needed    [provider]  eletriptan (RELPAX) 40 MG tablet Take 1 tablet (40 mg total) by mouth as needed for migraine or headache. May repeat in 2 hours if headache persists or recurs. 07/19/23   Drema Dallas, DO  hydrochlorothiazide (HYDRODIURIL) 12.5 MG tablet Take 1 tablet (12.5 mg total) by mouth daily. 05/15/23   Grayce Sessions, NP  ibuprofen (ADVIL) 600 MG tablet Take 1 tablet (600 mg total) by mouth every 6 (six) hours as needed. 08/04/21   Domenick Gong, MD  metoCLOPramide (REGLAN) 10 MG tablet Take 1 tablet (10  mg total) by mouth every 8 (eight) hours as needed for nausea. 05/15/23   Grayce Sessions, NP  nortriptyline (PAMELOR) 25 MG capsule Take 1 capsule (25 mg total) by mouth at bedtime. 07/19/23   Drema Dallas, DO  omeprazole (PRILOSEC) 40 MG capsule Take 1 capsule (40 mg total) by mouth daily. 07/04/23   May, Deanna J, NP    Current Outpatient Medications  Medication Sig Dispense Refill   amLODipine (NORVASC) 10 MG tablet Take 1 tablet (10 mg total) by mouth daily. 90 tablet 1   Aspirin-Salicylamide-Caffeine (BC HEADACHE POWDER PO) Take by mouth. As needed     eletriptan (RELPAX) 40 MG tablet Take 1 tablet (40 mg total) by mouth as needed for migraine or headache. May repeat in 2 hours if headache persists or recurs. 10 tablet 5   hydrochlorothiazide (HYDRODIURIL) 12.5 MG tablet Take 1 tablet (12.5 mg total) by mouth daily. 90 tablet 1   ibuprofen (ADVIL) 600 MG tablet Take 1 tablet (600 mg total) by mouth every 6 (six) hours as needed. 30 tablet 0   metoCLOPramide (REGLAN) 10 MG tablet Take 1 tablet (10 mg total) by mouth every 8 (eight) hours as needed for nausea. 30 tablet 1   nortriptyline (PAMELOR) 25 MG capsule Take 1 capsule (25 mg total) by mouth at bedtime. 30 capsule 5   omeprazole (PRILOSEC) 40 MG capsule Take 1 capsule (40 mg total) by mouth daily. 90 capsule 3   No  current facility-administered medications for this visit.    Allergies as of 07/23/2023   (No Known Allergies)    Family History  Problem Relation Age of Onset   Migraines Mother    Diabetes Mother    Migraines Daughter    Migraines Son    Colon cancer Neg Hx    Esophageal cancer Neg Hx     Social History   Socioeconomic History   Marital status: Single    Spouse name: Not on file   Number of children: 3   Years of education: Not on file   Highest education level: Not on file  Occupational History   Not on file  Tobacco Use   Smoking status: Every Day    Current packs/day: 0.00    Types: Cigarettes     Last attempt to quit: 06/23/2015    Years since quitting: 8.0   Smokeless tobacco: Former    Types: Snuff   Tobacco comments:    Did snuff when she was pregnant  Vaping Use   Vaping status: Never Used  Substance and Sexual Activity   Alcohol use: Yes    Comment: socially   Drug use: No   Sexual activity: Not Currently    Birth control/protection: None  Other Topics Concern   Not on file  Social History Narrative   Not on file   Social Drivers of Health   Financial Resource Strain: Not on file  Food Insecurity: No Food Insecurity (05/15/2023)   Hunger Vital Sign    Worried About Running Out of Food in the Last Year: Never true    Ran Out of Food in the Last Year: Never true  Transportation Needs: No Transportation Needs (05/15/2023)   PRAPARE - Administrator, Civil Service (Medical): No    Lack of Transportation (Non-Medical): No  Physical Activity: Not on file  Stress: Not on file  Social Connections: Not on file  Intimate Partner Violence: Not At Risk (05/15/2023)   Humiliation, Afraid, Rape, and Kick questionnaire    Fear of Current or Ex-Partner: No    Emotionally Abused: No    Physically Abused: No    Sexually Abused: No    Physical Exam: Vital signs in last 24 hours: BP (!) 165/103   Pulse (!) 129   Temp 98 F (36.7 C) (Temporal)   Ht 5\' 2"  (1.575 m)   Wt 185 lb (83.9 kg)   SpO2 97%   BMI 33.84 kg/m  GEN: NAD EYE: Sclerae anicteric ENT: MMM CV: Non-tachycardic Pulm: No increased WOB GI: Soft NEURO:  Alert & Oriented   Eulah Pont, MD [facility]   07/23/2023 1:32 PM

## 2023-07-23 NOTE — Progress Notes (Signed)
 Report given to PACU, vss

## 2023-07-23 NOTE — Patient Instructions (Addendum)
 YOU HAD AN ENDOSCOPIC PROCEDURE TODAY AT THE  ENDOSCOPY CENTER:   Refer to the procedure report that was given to you for any specific questions about what was found during the examination.  If the procedure report does not answer your questions, please call your gastroenterologist to clarify.  If you requested that your care partner not be given the details of your procedure findings, then the procedure report has been included in a sealed envelope for you to review at your convenience later.  YOU SHOULD EXPECT: Some feelings of bloating in the abdomen. Passage of more gas than usual.  Walking can help get rid of the air that was put into your GI tract during the procedure and reduce the bloating. If you had a lower endoscopy (such as a colonoscopy or flexible sigmoidoscopy) you may notice spotting of blood in your stool or on the toilet paper. If you underwent a bowel prep for your procedure, you may not have a normal bowel movement for a few days.  Please Note:  You might notice some irritation and congestion in your nose or some drainage.  This is from the oxygen used during your procedure.  There is no need for concern and it should clear up in a day or so.  SYMPTOMS TO REPORT IMMEDIATELY:  Following upper endoscopy (EGD)  Vomiting of blood or coffee ground material  New chest pain or pain under the shoulder blades  Painful or persistently difficult swallowing  New shortness of breath  Fever of 100F or higher  Black, tarry-looking stools  For urgent or emergent issues, a gastroenterologist can be reached at any hour by calling [phone]. Do not use MyChart messaging for urgent concerns.    DIET:  please follow a POST-DILATION DIET (see handout): NOTHING TO EAT OR DRINK until 3:30 pm today. Then CLEAR LIQUIDS ONLY from 3:30 pm to 4:30 pm today. Then you may have a SOFT DIET starting at 4:30 pm for the rest of the day today. You may proceed to your regular diet tomorrow morning as  tolerated.  Drink plenty of fluids but you should avoid alcoholic beverages for 24 hours.  MEDICATIONS: Continue present medications. Increase Omeprazole to 40 mg TWICE DAILY.  Reduce Non-steroidal Anti-inflammatory (Ibuprofen, Motrin, Alleve, Naproxen, Aspirin, etc.) medication use (including BC powder use).  FOLLOW UP: Await pathology results. Repeat upper endoscopy in 1 month for further esophageal dilation. Appointment made for patient in recovery.  Please see handouts given to you by your recovery nurse: Esophageal Stricture, Post-Dilation Diet.  Thank you for allowing [place] to provide for your healthcare needs today.   ACTIVITY:  You should plan to take it easy for the rest of today and you should NOT DRIVE or use heavy machinery until tomorrow (because of the sedation medicines used during the test).    FOLLOW UP: Our staff will call the number listed on your records the next business day following your procedure.  We will call around 7:15- 8:00 am to check on you and address any questions or concerns that you may have regarding the information given to you following your procedure. If we do not reach you, we will leave a message.     If any biopsies were taken you will be contacted by phone or by letter within the next 1-3 weeks.  Please call us at [phone] if you have not heard about the biopsies in 3 weeks.    SIGNATURES/CONFIDENTIALITY: You and/or your care partner have signed paperwork  which will be entered into your electronic medical record.  These signatures attest to the fact that that the information above on your After Visit Summary has been reviewed and is understood.  Full responsibility of the confidentiality of this discharge information lies with you and/or your care-partner.

## 2023-07-23 NOTE — Progress Notes (Signed)
 1415 HR > 100 with esmolol 25 mg given IV, MD updated, vss

## 2023-07-23 NOTE — Progress Notes (Signed)
 Patient arrives to recovery post upper endoscopy at 1436, patient spit up bloody sputum around 1443, MD at bedside and then c/o continued nausea/spitting up, Order obtained from Dr. Leonides Schanz and Zofran 4mg  IVP given with relief obtained by 1456. Patient appears comfortable and denies n/v prior to discharge.

## 2023-07-23 NOTE — Progress Notes (Signed)
1407 Robinul 0.1 mg IV given due large amount of secretions upon assessment.  MD made aware, vss  ?

## 2023-07-24 ENCOUNTER — Telehealth: Payer: Self-pay | Admitting: *Deleted

## 2023-07-24 ENCOUNTER — Telehealth: Payer: Self-pay | Admitting: Pharmacy Technician

## 2023-07-24 NOTE — Telephone Encounter (Signed)
 PA has been submitted, and telephone encounter has been created. Please see telephone encounter dated 4.8.25.

## 2023-07-24 NOTE — Telephone Encounter (Signed)
 Attempted f/u phone call. No answer. Left message.

## 2023-07-24 NOTE — Telephone Encounter (Signed)
 Pharmacy Patient Advocate Encounter   Received notification from Pt Calls Messages that prior authorization for ELETRIPTAN 40MG  is required/requested.   Insurance verification completed.   The patient is insured through [facility] .   Per test claim: PA required; PA submitted to above mentioned insurance via CoverMyMeds Key/confirmation #/EOC [id number] Status is pending

## 2023-07-26 ENCOUNTER — Encounter: Payer: Self-pay | Admitting: Internal Medicine

## 2023-07-26 LAB — SURGICAL PATHOLOGY

## 2023-07-26 NOTE — Progress Notes (Signed)
 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 Recommend bismuth quadruple therapy for treatment: - Tetracycline 500 mg QID x 14 days - Flagyl 250 mg QID x 14 days - Bismuth subsalicylate 524 mg QID x 14 days - PPI BID x 14 days  During her next upper endoscopy that is scheduled on 5/20, I will plan to take biopsies to see if the H pylori infection has been eradicated.

## 2023-07-27 ENCOUNTER — Other Ambulatory Visit: Payer: Self-pay

## 2023-07-27 ENCOUNTER — Telehealth: Payer: Self-pay | Admitting: Internal Medicine

## 2023-07-27 ENCOUNTER — Telehealth: Payer: Self-pay

## 2023-07-27 ENCOUNTER — Other Ambulatory Visit (HOSPITAL_COMMUNITY): Payer: Self-pay

## 2023-07-27 DIAGNOSIS — B9681 Helicobacter pylori [H. pylori] as the cause of diseases classified elsewhere: Secondary | ICD-10-CM

## 2023-07-27 MED ORDER — BISMUTH SUBSALICYLATE 262 MG PO TABS: 524.0000 mg | ORAL_TABLET | Freq: Four times a day (QID) | ORAL | 0 refills | Status: AC

## 2023-07-27 MED ORDER — TETRACYCLINE HCL 500 MG PO CAPS: 500.0000 mg | ORAL_CAPSULE | Freq: Four times a day (QID) | ORAL | 0 refills | Status: AC

## 2023-07-27 MED ORDER — METRONIDAZOLE 250 MG PO TABS: 250.0000 mg | ORAL_TABLET | Freq: Four times a day (QID) | ORAL | 0 refills | Status: AC

## 2023-07-27 NOTE — Telephone Encounter (Signed)
 Pt was notified that the PA has been started. Pt was notified to not start any of the other medications that were sent to the pharmacy. Pt was notified that the 4 medications needed to be taking during the 14 day period together for the regiment to be effective . Pt made aware.  Pt verbalized understanding with all questions answered.

## 2023-07-27 NOTE — Telephone Encounter (Signed)
 Please see note below and assist with PA

## 2023-07-27 NOTE — Telephone Encounter (Signed)
 Pharmacy Patient Advocate Encounter   Received notification from CoverMyMeds that prior authorization for Tetracycline HCl 500MG  capsules is required/requested.   Insurance verification completed.   The patient is insured through [facility] .   Per test claim: PA required; PA submitted to above mentioned insurance via CoverMyMeds Key/confirmation #/EOC [id number] Status is pending

## 2023-07-27 NOTE — Telephone Encounter (Signed)
 PA request has been Submitted. New Encounter has been or will be created for follow up. For additional info see Pharmacy Prior Auth telephone encounter from 07/27/2023.

## 2023-07-27 NOTE — Telephone Encounter (Signed)
 Inbound call from patient stating that her pharmacy stated that she needs a PA for Sumycin. Please advise.

## 2023-07-27 NOTE — Telephone Encounter (Signed)
 Pharmacy Patient Advocate Encounter  Received notification from [facility] that Prior Authorization for ELETRIPTAN 40MG  has been APPROVED from 4.9.25 to 4.9.26. Unable to obtain price due to refill too soon rejection, last fill date 4.9.25 next available fill date5.1.25   PA #/Case ID/Reference #: [id number]

## 2023-07-30 NOTE — Telephone Encounter (Signed)
 The pt has been advised of the approval

## 2023-07-30 NOTE — Telephone Encounter (Signed)
 Pharmacy Patient Advocate Encounter  Received notification from [facility] that Prior Authorization for Tetracycline HCl 500MG  capsules has been APPROVED from 07-27-2023 to 08-26-2023   PA #/Case ID/Reference #: [id number]

## 2023-09-04 ENCOUNTER — Ambulatory Visit (AMBULATORY_SURGERY_CENTER): Admitting: Internal Medicine

## 2023-09-04 ENCOUNTER — Encounter: Payer: Self-pay | Admitting: Internal Medicine

## 2023-09-04 VITALS — BP 137/82 | HR 102 | Temp 97.3°F | Resp 16 | Ht 62.0 in | Wt 185.0 lb

## 2023-09-04 DIAGNOSIS — K2980 Duodenitis without bleeding: Secondary | ICD-10-CM | POA: Diagnosis not present

## 2023-09-04 DIAGNOSIS — K2970 Gastritis, unspecified, without bleeding: Secondary | ICD-10-CM | POA: Diagnosis not present

## 2023-09-04 DIAGNOSIS — K219 Gastro-esophageal reflux disease without esophagitis: Secondary | ICD-10-CM

## 2023-09-04 DIAGNOSIS — B9681 Helicobacter pylori [H. pylori] as the cause of diseases classified elsewhere: Secondary | ICD-10-CM | POA: Diagnosis not present

## 2023-09-04 DIAGNOSIS — K2950 Unspecified chronic gastritis without bleeding: Secondary | ICD-10-CM

## 2023-09-04 DIAGNOSIS — E669 Obesity, unspecified: Secondary | ICD-10-CM | POA: Diagnosis not present

## 2023-09-04 DIAGNOSIS — K222 Esophageal obstruction: Secondary | ICD-10-CM | POA: Diagnosis not present

## 2023-09-04 DIAGNOSIS — R1310 Dysphagia, unspecified: Secondary | ICD-10-CM | POA: Diagnosis not present

## 2023-09-04 DIAGNOSIS — I10 Essential (primary) hypertension: Secondary | ICD-10-CM | POA: Diagnosis not present

## 2023-09-04 DIAGNOSIS — R1319 Other dysphagia: Secondary | ICD-10-CM

## 2023-09-04 MED ORDER — SODIUM CHLORIDE 0.9 % IV SOLN: 500.0000 mL | Freq: Once | INTRAVENOUS | Status: DC

## 2023-09-04 NOTE — Progress Notes (Signed)
 Pt's states no medical or surgical changes since previsit or office visit.

## 2023-09-04 NOTE — Progress Notes (Signed)
 GASTROENTEROLOGY PROCEDURE H&P NOTE   Primary Care Physician: Marius Siemens, NP    Reason for Procedure:   Esophageal stenosis, history of H pylori  Plan:    EGD  Patient is appropriate for endoscopic procedure(s) in the ambulatory ([facility]) setting.  The nature of the procedure, as well as the risks, benefits, and alternatives were carefully and thoroughly reviewed with the patient. Ample time for discussion and questions allowed. The patient understood, was satisfied, and agreed to proceed.     HPI: Sandra Mcknight is a 43 y.o. female who presents for EGD for esophageal stenosis, history of H pylori infection. She had significant improvement in her dysphagia after her last esophageal dilation. She does still have some residual dysphagia.  EGD 07/23/23: - One benign- appearing, intrinsic moderate ( circumferential scarring or stenosis; an endoscope may pass) stenosis was found in the distal esophagus. This stenosis measured 1 cm ( in length) . The stenosis was traversed. A TTS dilator was passed through the scope. Dilation with a 10- 11- 12 mm balloon dilator was performed to 12 mm. The dilation site was examined and showed mild mucosal disruption. - Biopsies were taken with a cold forceps in the proximal esophagus and in the distal esophagus for histology. - Retained fluid was found in the gastric body. - Localized inflammation characterized by congestion ( edema) , erosions and erythema was found in the gastric antrum. Biopsies were taken with a cold forceps for Helicobacter pylori testing. - Three non- bleeding cratered duodenal ulcers with a clean ulcer base ( Forrest Class III) were found in the duodenal bulb. The largest lesion was 6 mm in largest dimension. Biopsies were taken with a cold forceps for histology. Path: 1. Surgical [P], duodenum :      DUODENAL MUCOSA WITH FOVEOLAR METAPLASIA AND BRUNNER GLAND HYPERPLASIA      CONSISTENT WITH CHRONIC PEPTIC DUODENITIS.       NEGATIVE FOR DYSPLASIA.      2. Surgical [P], gastric sites :      GASTRIC ANTRAL / OXYNTIC MUCOSA WITH CHRONIC INACTIVE GASTRITIS.      IMMUNOHISTOCHEMICAL STAIN FOR H. PYLORI IS POSITIVE      NEGATIVE FOR INTESTINAL METAPLASIA OR DYSPLASIA.      3. Surgical [P], distal esophagus :      ESOPHAGEAL SQUAMOCOLUMNAR MUCOSA WITH MARKED REACTIVE EPITHELIAL CHANGES.      NO EVIDENCE OF SIGNIFICANT INTRAEPITHELIAL LYMPHOCYTES OR EOSINOPHILS.      NEGATIVE FOR INTESTINAL METAPLASIA OR DYSPLASIA.      4. Surgical [P], proximal esophagus :      ESOPHAGEAL SQUAMOUS MUCOSA WITH MARKED REACTIVE EPITHELIAL CHANGES.      NO EVIDENCE OF SIGNIFICANT INTRAEPITHELIAL LYMPHOCYTES OR EOSINOPHILS.      NEGATIVE FOR INTESTINAL METAPLASIA OR DYSPLASIA.      Diagnosis Note : The findings could be compatible with severe reflux      disease.Correlation with clinical information is recommended.   Past Medical History:  Diagnosis Date   Gestational diabetes    Hypertension    Meningitis    Migraine    Obesity (BMI 35.0-39.9 without comorbidity)     Past Surgical History:  Procedure Laterality Date   NO PAST SURGERIES      Prior to Admission medications   Medication Sig Start Date End Date Taking? Authorizing Provider  amLODipine  (NORVASC ) 10 MG tablet Take 1 tablet (10 mg total) by mouth daily. 03/21/23  Yes Marius Siemens, NP  hydrochlorothiazide  (HYDRODIURIL ) 12.5 MG  tablet Take 1 tablet (12.5 mg total) by mouth daily. 05/15/23  Yes Marius Siemens, NP  omeprazole  (PRILOSEC) 40 MG capsule Take 1 capsule (40 mg total) by mouth daily. 07/04/23  Yes May, Deanna J, NP  omeprazole  (PRILOSEC) 40 MG capsule Take 1 capsule (40 mg total) by mouth 2 (two) times daily. 07/23/23  Yes Daina Drum, MD  Aspirin-Salicylamide-Caffeine  (BC HEADACHE POWDER PO) Take by mouth. As needed Patient not taking: Reported on 09/04/2023    [provider]  eletriptan  (RELPAX ) 40 MG tablet Take 1 tablet (40 mg total) by  mouth as needed for migraine or headache. May repeat in 2 hours if headache persists or recurs. Patient not taking: Reported on 09/04/2023 07/19/23   Merriam Abbey, DO  ibuprofen  (ADVIL ) 600 MG tablet Take 1 tablet (600 mg total) by mouth every 6 (six) hours as needed. 08/04/21   Mortenson, Ashley, MD  metoCLOPramide  (REGLAN ) 10 MG tablet Take 1 tablet (10 mg total) by mouth every 8 (eight) hours as needed for nausea. 05/15/23   Marius Siemens, NP  nortriptyline  (PAMELOR ) 25 MG capsule Take 1 capsule (25 mg total) by mouth at bedtime. 07/19/23   Merriam Abbey, DO    Current Outpatient Medications  Medication Sig Dispense Refill   amLODipine  (NORVASC ) 10 MG tablet Take 1 tablet (10 mg total) by mouth daily. 90 tablet 1   hydrochlorothiazide  (HYDRODIURIL ) 12.5 MG tablet Take 1 tablet (12.5 mg total) by mouth daily. 90 tablet 1   omeprazole  (PRILOSEC) 40 MG capsule Take 1 capsule (40 mg total) by mouth daily. 90 capsule 3   omeprazole  (PRILOSEC) 40 MG capsule Take 1 capsule (40 mg total) by mouth 2 (two) times daily. 180 capsule 3   Aspirin-Salicylamide-Caffeine  (BC HEADACHE POWDER PO) Take by mouth. As needed (Patient not taking: Reported on 09/04/2023)     eletriptan  (RELPAX ) 40 MG tablet Take 1 tablet (40 mg total) by mouth as needed for migraine or headache. May repeat in 2 hours if headache persists or recurs. (Patient not taking: Reported on 09/04/2023) 10 tablet 5   ibuprofen  (ADVIL ) 600 MG tablet Take 1 tablet (600 mg total) by mouth every 6 (six) hours as needed. 30 tablet 0   metoCLOPramide  (REGLAN ) 10 MG tablet Take 1 tablet (10 mg total) by mouth every 8 (eight) hours as needed for nausea. 30 tablet 1   nortriptyline  (PAMELOR ) 25 MG capsule Take 1 capsule (25 mg total) by mouth at bedtime. 30 capsule 5   Current Facility-Administered Medications  Medication Dose Route Frequency Provider Last Rate Last Admin   0.9 %  sodium chloride  infusion  500 mL Intravenous Once Ahtziry Saathoff C, MD        0.9 %  sodium chloride  infusion  500 mL Intravenous Once Cameran Ahmed C, MD        Allergies as of 09/04/2023   (No Known Allergies)    Family History  Problem Relation Age of Onset   Migraines Mother    Diabetes Mother    Migraines Daughter    Migraines Son    Colon cancer Neg Hx    Esophageal cancer Neg Hx    Colon polyps Neg Hx    Rectal cancer Neg Hx    Stomach cancer Neg Hx     Social History   Socioeconomic History   Marital status: Single    Spouse name: Not on file   Number of children: 3   Years of education: Not on  file   Highest education level: Not on file  Occupational History   Not on file  Tobacco Use   Smoking status: Every Day    Current packs/day: 0.50    Average packs/day: 0.5 packs/day for 3.4 years (1.7 ttl pk-yrs)    Types: Cigarettes    Start date: 2022   Smokeless tobacco: Former    Types: Snuff   Tobacco comments:    Did snuff when she was pregnant  Vaping Use   Vaping status: Never Used  Substance and Sexual Activity   Alcohol use: Yes    Comment: socially   Drug use: No   Sexual activity: Not Currently    Birth control/protection: None  Other Topics Concern   Not on file  Social History Narrative   Not on file   Social Drivers of Health   Financial Resource Strain: Not on file  Food Insecurity: No Food Insecurity (05/15/2023)   Hunger Vital Sign    Worried About Running Out of Food in the Last Year: Never true    Ran Out of Food in the Last Year: Never true  Transportation Needs: No Transportation Needs (05/15/2023)   PRAPARE - Administrator, Civil Service (Medical): No    Lack of Transportation (Non-Medical): No  Physical Activity: Not on file  Stress: Not on file  Social Connections: Not on file  Intimate Partner Violence: Not At Risk (05/15/2023)   Humiliation, Afraid, Rape, and Kick questionnaire    Fear of Current or Ex-Partner: No    Emotionally Abused: No    Physically Abused: No    Sexually Abused: No     Physical Exam: Vital signs in last 24 hours: BP (!) 147/101 Comment: 141/94 recheck  Pulse 90   Temp (!) 97.3 F (36.3 C)   Ht 5\' 2"  (1.575 m)   Wt 185 lb (83.9 kg)   SpO2 99%   BMI 33.84 kg/m  GEN: NAD EYE: Sclerae anicteric ENT: MMM CV: Non-tachycardic Pulm: No increased work of breathing GI: Soft, NT/ND NEURO:  Alert & Oriented   Regino Caprio, MD  Gastroenterology  09/04/2023 9:39 AM

## 2023-09-04 NOTE — Patient Instructions (Signed)
 Continue your omeprazole  as ordered.  You have several refills. Resume all of your other medications today as ordered. Read the handouts given to you by your recovery room nurse.   YOU HAD AN ENDOSCOPIC PROCEDURE TODAY AT THE [facility]:   Refer to the procedure report that was given to you for any specific questions about what was found during the examination.  If the procedure report does not answer your questions, please call your gastroenterologist to clarify.  If you requested that your care partner not be given the details of your procedure findings, then the procedure report has been included in a sealed envelope for you to review at your convenience later.  YOU SHOULD EXPECT: Some feelings of bloating in the abdomen. Passage of more gas than usual.  Walking can help get rid of the air that was put into your GI tract during the procedure and reduce the bloating. If you had a lower endoscopy (such as a colonoscopy or flexible sigmoidoscopy) you may notice spotting of blood in your stool or on the toilet paper. If you underwent a bowel prep for your procedure, you may not have a normal bowel movement for a few days.  Please Note:  You might notice some irritation and congestion in your nose or some drainage.  This is from the oxygen used during your procedure.  There is no need for concern and it should clear up in a day or so.  SYMPTOMS TO REPORT IMMEDIATELY:   Vomiting of blood or coffee ground material  New chest pain or pain under the shoulder blades  Painful or persistently difficult swallowing  New shortness of breath  Fever of 100F or higher  Black, tarry-looking stools  For urgent or emergent issues, a gastroenterologist can be reached at any hour by calling [phone]. Do not use MyChart messaging for urgent concerns.    DIET:  We do recommend clear liquids for 1 hour and then a soft diet for the rest of today.  Then you may proceed to your regular  diet.tomorrow.   Drink plenty of fluids but you should avoid alcoholic beverages for 24 hours.  ACTIVITY:  You should plan to take it easy for the rest of today and you should NOT DRIVE or use heavy machinery until tomorrow (because of the sedation medicines used during the test).    FOLLOW UP: Our staff will call the number listed on your records the next business day following your procedure.  We will call around 7:15- 8:00 am to check on you and address any questions or concerns that you may have regarding the information given to you following your procedure. If we do not reach you, we will leave a message.     If any biopsies were taken you will be contacted by phone or by letter within the next 1-3 weeks.  Please call us  at [phone] if you have not heard about the biopsies in 3 weeks.    SIGNATURES/CONFIDENTIALITY: You and/or your care partner have signed paperwork which will be entered into your electronic medical record.  These signatures attest to the fact that that the information above on your After Visit Summary has been reviewed and is understood.  Full responsibility of the confidentiality of this discharge information lies with you and/or your care-partner.

## 2023-09-04 NOTE — Op Note (Signed)
 [facility] Patient Name: Sandra Mcknight Procedure Date: 09/04/2023 10:07 AM MRN: [id number] Endoscopist: Freada Jacobs [place] , , [id number] Age: 43 Referring MD:  Date of Birth: [birth year] Gender: Female Account #: [id number] Procedure:                Upper GI endoscopy Indications:              Dysphagia, Follow-up of Helicobacter pylori,                            Follow-up of esophageal stenosis Medicines:                Monitored Anesthesia Care Procedure:                Pre-Anesthesia Assessment:                           - Prior to the procedure, a History and Physical                            was performed, and patient medications and                            allergies were reviewed. The patient's tolerance of                            previous anesthesia was also reviewed. The risks                            and benefits of the procedure and the sedation                            options and risks were discussed with the patient.                            All questions were answered, and informed consent                            was obtained. Prior Anticoagulants: The patient has                            taken no anticoagulant or antiplatelet agents. ASA                            Grade Assessment: II - A patient with mild systemic                            disease. After reviewing the risks and benefits,                            the patient was deemed in satisfactory condition to                            undergo the procedure.  After obtaining informed consent, the endoscope was                            passed under direct vision. Throughout the                            procedure, the patient's blood pressure, pulse, and                            oxygen saturations were monitored continuously. The                            Olympus Scope [id number] was introduced through the                            mouth, and  advanced to the second part of duodenum.                            The upper GI endoscopy was accomplished without                            difficulty. The patient tolerated the procedure                            well. Scope In: Scope Out: Findings:                 One benign-appearing, intrinsic moderate                            (circumferential scarring or stenosis; an endoscope                            may pass) stenosis was found in the distal                            esophagus. This stenosis measured 1 cm (in length).                            The stenosis was traversed. A TTS dilator was                            passed through the scope. Dilation with a                            12-13.5-15 mm balloon dilator was performed to 15                            mm. The dilation site was examined and showed mild                            mucosal disruption.                           Localized inflammation characterized by  congestion                            (edema), erosions and erythema was found in the                            gastric body and in the gastric antrum. Biopsies                            were taken with a cold forceps for histology.                           Localized inflammation characterized by congestion                            (edema) and erythema was found in the duodenal bulb. Complications:            No immediate complications. Estimated Blood Loss:     Estimated blood loss was minimal. Impression:               - Benign-appearing esophageal stenosis. Dilated.                           - Gastritis. Biopsied.                           - Duodenitis. Recommendation:           - Discharge patient to home (with escort).                           - Await pathology results.                           - Continue omeprazole  40 mg BID for now                           - Return to GI clinic in 2-3 months.                           - The findings and  recommendations were discussed                            with the patient. Dr Pedro Bourgeois "Sunflower" Harris,  09/04/2023 10:36:01 AM

## 2023-09-04 NOTE — Progress Notes (Signed)
 Report to PACU, RN, vss, BBS= Clear.

## 2023-09-04 NOTE — Progress Notes (Signed)
 Called to room to assist during endoscopic procedure.  Patient ID and intended procedure confirmed with present staff. Received instructions for my participation in the procedure from the performing physician.

## 2023-09-05 ENCOUNTER — Telehealth: Payer: Self-pay | Admitting: Lactation Services

## 2023-09-05 NOTE — Telephone Encounter (Signed)
 No answer left voice mail

## 2023-09-06 ENCOUNTER — Ambulatory Visit: Payer: Self-pay | Admitting: Internal Medicine

## 2023-09-06 LAB — SURGICAL PATHOLOGY

## 2023-09-07 ENCOUNTER — Telehealth: Payer: Self-pay

## 2023-09-07 ENCOUNTER — Other Ambulatory Visit (HOSPITAL_COMMUNITY): Payer: Self-pay

## 2023-09-07 NOTE — Telephone Encounter (Signed)
 Pharmacy Patient Advocate Encounter   Received notification from Pt Calls Messages that prior authorization for Talicia 250-12.5-10MG  dr capsules is required/requested.   Insurance verification completed.   The patient is insured through [facility] .   Per test claim: PA required; PA submitted to above mentioned insurance via CoverMyMeds Key/confirmation #/EOC [id number] Status is pending

## 2023-09-13 ENCOUNTER — Other Ambulatory Visit: Payer: Self-pay

## 2023-09-13 MED ORDER — AMOXICILLIN 500 MG PO CAPS: 1000.0000 mg | ORAL_CAPSULE | Freq: Three times a day (TID) | ORAL | 0 refills | Status: AC

## 2023-09-13 MED ORDER — LEVOFLOXACIN 250 MG PO TABS
500.0000 mg | ORAL_TABLET | Freq: Every day | ORAL | 0 refills | Status: AC
Start: 2023-09-13 — End: 2023-09-27

## 2023-09-13 NOTE — Progress Notes (Signed)
 PA was denied but denial letter has not been received. Once received, will see if denial reason will deem for an appeal or if other therapy is needed.

## 2023-09-13 NOTE — Telephone Encounter (Signed)
 See result note.

## 2023-09-13 NOTE — Telephone Encounter (Signed)
 Pharmacy Patient Advocate Encounter  Received notification from [facility] that Prior Authorization for  Talicia 250-12.5-10MG  dr capsules has been DENIED.  Full denial letter will be uploaded to the media tab. See denial reason below.  Here are the policy requirements your request did not meet:  The request does not meet the [place]  Medicaid Preferred Drug List (PDL) trial and failure criteria for the use of a non-preferred drug. To meet the criteria for approval, you must first try the following preferred drug on the [place] Preferred Drug List (PDL) (this is a list of covered drugs):  Pylera capsule  Our pharmacy records and information sent in by your doctor do not show that this drug was tried and whether or not it helped your condition. If you have already tried this drug and it caused side effects, did not work, or there is a reason you cannot take this drug, please ask your doctor to send us  the information  PA #/Case ID/Reference #: [id number]
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
## 2023-09-21 ENCOUNTER — Ambulatory Visit: Payer: Medicaid Other | Admitting: Neurology
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
## 2023-10-01 ENCOUNTER — Ambulatory Visit: Admitting: Neurology

## 2023-11-20 ENCOUNTER — Ambulatory Visit: Admitting: Internal Medicine

## 2023-12-18 ENCOUNTER — Encounter (HOSPITAL_COMMUNITY): Payer: Self-pay

## 2023-12-18 ENCOUNTER — Other Ambulatory Visit: Payer: Self-pay

## 2023-12-18 ENCOUNTER — Emergency Department (HOSPITAL_COMMUNITY)
Admission: EM | Admit: 2023-12-18 | Discharge: 2023-12-18 | Disposition: A | Discharge status: Home / Self Care | Attending: Emergency Medicine | Admitting: Emergency Medicine

## 2023-12-18 DIAGNOSIS — T7840XA Allergy, unspecified, initial encounter: Secondary | ICD-10-CM | POA: Insufficient documentation

## 2023-12-18 DIAGNOSIS — Z7982 Long term (current) use of aspirin: Secondary | ICD-10-CM | POA: Diagnosis not present

## 2023-12-18 MED ORDER — EPINEPHRINE 0.3 MG/0.3ML IJ SOAJ: 0.3000 mg | INTRAMUSCULAR | 0 refills | Status: DC | PRN

## 2023-12-18 MED ORDER — DIPHENHYDRAMINE HCL 25 MG PO CAPS
25.0000 mg | ORAL_CAPSULE | Freq: Once | ORAL | Status: AC
  Administered 2023-12-18: 25 mg via ORAL
  Filled 2023-12-18: qty 1

## 2023-12-18 MED ORDER — EPINEPHRINE 0.3 MG/0.3ML IJ SOAJ
0.3000 mg | Freq: Once | INTRAMUSCULAR | Status: AC
  Administered 2023-12-18: 0.3 mg via INTRAMUSCULAR
  Filled 2023-12-18: qty 0.3

## 2023-12-18 MED ORDER — PREDNISONE 20 MG PO TABS
60.0000 mg | ORAL_TABLET | Freq: Once | ORAL | Status: AC
  Administered 2023-12-18: 60 mg via ORAL
  Filled 2023-12-18: qty 3

## 2023-12-18 MED ORDER — PREDNISONE 10 MG PO TABS: 30.0000 mg | ORAL_TABLET | Freq: Every day | ORAL | 0 refills | Status: AC

## 2023-12-18 NOTE — ED Provider Notes (Signed)
 Patient signed out to me by previous PA [place] at time of shift change, please see his note for further detail.  At this time patient is currently pending reevaluation.  Patient presented to the emergency department with anaphylactic reaction of unknown origin.  This has happened once previously to the patient in 2019 per chart review.  Patient presented with tongue swelling as well as a muffled voice.  Patient was symptomatically treated with epinephrine , Benadryl , as well as prednisone .  Patient agreed to stay until roughly 5 PM for continued monitoring to make sure that the allergic reaction does not recur.    I personally reassessed the patient at approximately 5:30 PM and patient is clinically well, currently denies any symptoms, patient talking on room air on full sentences, no tripoding, no obvious tongue swelling on my evaluation, no uvular deviation, no stridor, lung sounds normal, patient not in acute distress.  At this time I feel patient is stable for discharge.  Patient given discharge instructions to follow-up with primary care provider, allergist, prescribed EpiPen 's as well as a short course of outpatient prednisone .    Return precautions given and patient discharged.  Instructed her to return to the emergency department anytime she has use an EpiPen , if an allergic reaction like this ever happens again instructed her to attempt to take an oral antihistamine like Zyrtec , Claritin, or Benadryl , also instructed her that famotidine  can also help.     Janetta Terrall FALCON, PA-C 12/18/23 2336    Horton, Kristie M, DO 12/18/23 2343

## 2023-12-18 NOTE — ED Notes (Signed)
 Pt states she is feeling much better. EDPA advised

## 2023-12-18 NOTE — ED Notes (Signed)
 This paramedic went to discharge to patient and she had already unhooked herself from monitoring, removed her IV and left the building.

## 2023-12-18 NOTE — ED Provider Notes (Signed)
 [place] EMERGENCY DEPARTMENT AT [facility] Provider Note   CSN: [id number] Arrival date & time: 12/18/23  1400     Sandra Mcknight presents with: Allergic Reaction   Sandra Mcknight is a 43 y.o. female.    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  No wheezing or shortness of breath, no rash.  Have a history of anaphylaxis.     Prior to Admission medications   Medication Sig Start Date End Date Taking? Authorizing Provider  EPINEPHrine  (EPIPEN  2-PAK) 0.3 mg/0.3 mL IJ SOAJ injection Inject 0.3 mg into the muscle as needed for anaphylaxis. 12/18/23  Yes Amarie Viles S, PA  predniSONE  (DELTASONE ) 10 MG tablet Take 3 tablets (30 mg total) by mouth daily for 4 days. 12/18/23 12/22/23 Yes Pola Furno S, PA  amLODipine  (NORVASC ) 10 MG tablet Take 1 tablet (10 mg total) by mouth daily. 03/21/23   Celestia Rosaline SQUIBB, NP  Aspirin-Salicylamide-Caffeine  (BC HEADACHE POWDER PO) Take by mouth. As needed Sandra Mcknight not taking: Reported on 09/04/2023    [provider]  eletriptan  (RELPAX ) 40 MG tablet Take 1 tablet (40 mg total) by mouth as needed for migraine or headache. May repeat in 2 hours if headache persists or recurs. Sandra Mcknight not taking: Reported on 09/04/2023 07/19/23   Skeet Juliene SAUNDERS, DO  hydrochlorothiazide  (HYDRODIURIL ) 12.5 MG tablet Take 1 tablet (12.5 mg total) by mouth daily. 05/15/23   Celestia Rosaline SQUIBB, NP  ibuprofen  (ADVIL ) 600 MG tablet Take 1 tablet (600 mg total) by mouth every 6 (six) hours as needed. 08/04/21   Mortenson, Ashley, MD  metoCLOPramide  (REGLAN ) 10 MG tablet Take 1 tablet (10 mg total) by mouth every 8 (eight) hours as needed for nausea. 05/15/23   Celestia Rosaline SQUIBB, NP  nortriptyline  (PAMELOR ) 25 MG capsule Take 1 capsule (25  mg total) by mouth at bedtime. 07/19/23   Skeet Juliene SAUNDERS, DO  omeprazole  (PRILOSEC) 40 MG capsule Take 1 capsule (40 mg total) by mouth daily. 07/04/23   May, Deanna J, NP  omeprazole  (PRILOSEC) 40 MG capsule Take 1 capsule (40 mg total) by mouth 2 (two) times daily. 07/23/23   Federico Rosario BROCKS, MD    Allergies: Sandra Mcknight has no known allergies.    Review of Systems  Updated Vital Signs BP (!) 148/102   Pulse (!) 104   Temp 98.1 F (36.7 C) (Oral)   Resp 16   Ht 5' 2 (1.575 m)   Wt 88.5 kg   SpO2 99%   BMI 35.67 kg/m   Physical Exam Vitals and nursing note reviewed.  Constitutional:      General: She is not in acute distress. HENT:     Head: Normocephalic and atraumatic.     Nose: Nose normal.     Mouth/Throat:     Comments: Some muffled voice Eyes:     General: No scleral icterus. Cardiovascular:     Rate and Rhythm: Normal rate and regular rhythm.     Pulses: Normal pulses.     Heart sounds: Normal heart sounds.  Pulmonary:     Effort: Pulmonary effort is normal. No respiratory distress.     Breath sounds: No wheezing.     Comments: No wheezing Abdominal:     Palpations: Abdomen is soft.  Tenderness: There is no abdominal tenderness.  Musculoskeletal:     Cervical back: Normal range of motion.     Right lower leg: No edema.     Left lower leg: No edema.  Skin:    General: Skin is warm and dry.     Capillary Refill: Capillary refill takes less than 2 seconds.  Neurological:     Mental Status: She is alert. Mental status is at baseline.  Psychiatric:        Mood and Affect: Mood normal.        Behavior: Behavior normal.     (all labs ordered are listed, but only abnormal results are displayed) Labs Reviewed - No data to display  EKG: None  Radiology: No results found.   .Critical Care  Performed by: Neldon Hamp RAMAN, PA Authorized by: Neldon Hamp RAMAN, PA   Critical care provider statement:    Critical care time (minutes):  35   Critical care time  was exclusive of:  Separately billable procedures and treating other patients and teaching time   Critical care was necessary to treat or prevent imminent or life-threatening deterioration of the following conditions: anaphylaxis.   Critical care was time spent personally by me on the following activities:  Development of treatment plan with Sandra Mcknight or surrogate, review of old charts, re-evaluation of Sandra Mcknight's condition, pulse oximetry, ordering and review of radiographic studies, ordering and review of laboratory studies, ordering and performing treatments and interventions, obtaining history from Sandra Mcknight or surrogate, examination of Sandra Mcknight and evaluation of Sandra Mcknight's response to treatment   Care discussed with: admitting provider      Medications Ordered in the ED  EPINEPHrine  (EPI-PEN) injection 0.3 mg (0.3 mg Intramuscular Given 12/18/23 1420)  diphenhydrAMINE  (BENADRYL ) capsule 25 mg (25 mg Oral Given 12/18/23 1419)  predniSONE  (DELTASONE ) tablet 60 mg (60 mg Oral Given 12/18/23 1419)                                    Medical Decision Making Risk Prescription drug management.   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  No wheezing or shortness of breath, no rash.  Have a history of anaphylaxis.  Epi given. Pt symptoms improved.   Shared decision making conversation with Sandra Mcknight we will monitor her until 5 PM.  If no bimodal reaction will discharge home with EpiPen  and prednisone .  She will need to follow-up with an allergist.  Sandra Mcknight care handed off to p.m. team for monitoring.   Final diagnoses:  Allergic reaction, initial encounter    ED Discharge Orders          Ordered    EPINEPHrine  (EPIPEN  2-PAK) 0.3 mg/0.3 mL IJ SOAJ injection  As needed        12/18/23 1543     predniSONE  (DELTASONE ) 10 MG tablet  Daily        12/18/23 1543               Neldon Hamp RAMAN, [place] 12/18/23 1605    Mannie Pac T, DO 12/21/23 [phone]

## 2023-12-18 NOTE — ED Triage Notes (Signed)
 Pt complains of tongue swelling/numbness and difficulty swallowing beginning maybe an hr ago. It is unclear what she may be reacting to.   She had one episode of vomiting when trying to drink something.

## 2023-12-18 NOTE — Discharge Instructions (Addendum)
 I am discharging you home with a prescription for 2 EpiPen 's.  If you have to use an EpiPen  please return to the emergency department for evaluation and monitoring. I would like you to follow-up with an allergist.  I have given you the information for an office but you may talk to your primary care doctor about a referral as well. Use prednisone  for the next 4 days as prescribed.  If symptoms persist or worsen recommend follow-up with primary care provider in 48 hours or return to the emergency department.

## 2024-03-22 ENCOUNTER — Emergency Department (HOSPITAL_COMMUNITY)
Admission: EM | Admit: 2024-03-22 | Discharge: 2024-03-22 | Disposition: A | Discharge status: Home / Self Care | Attending: Emergency Medicine | Admitting: Emergency Medicine

## 2024-03-22 DIAGNOSIS — F17210 Nicotine dependence, cigarettes, uncomplicated: Secondary | ICD-10-CM | POA: Diagnosis not present

## 2024-03-22 DIAGNOSIS — Z79899 Other long term (current) drug therapy: Secondary | ICD-10-CM | POA: Diagnosis not present

## 2024-03-22 DIAGNOSIS — T783XXA Angioneurotic edema, initial encounter: Secondary | ICD-10-CM | POA: Diagnosis not present

## 2024-03-22 DIAGNOSIS — I10 Essential (primary) hypertension: Secondary | ICD-10-CM | POA: Diagnosis not present

## 2024-03-22 MED ORDER — EPINEPHRINE 0.3 MG/0.3ML IJ SOAJ: 0.3000 mg | INTRAMUSCULAR | 0 refills | Status: AC | PRN

## 2024-03-22 MED ORDER — METHYLPREDNISOLONE SODIUM SUCC 125 MG IJ SOLR
125.0000 mg | Freq: Once | INTRAMUSCULAR | Status: AC
  Administered 2024-03-22: 125 mg via INTRAVENOUS
  Filled 2024-03-22: qty 2

## 2024-03-22 MED ORDER — DIPHENHYDRAMINE HCL 50 MG/ML IJ SOLN
50.0000 mg | Freq: Once | INTRAMUSCULAR | Status: AC
  Administered 2024-03-22: 50 mg via INTRAVENOUS
  Filled 2024-03-22: qty 1

## 2024-03-22 MED ORDER — FAMOTIDINE IN NACL 20-0.9 MG/50ML-% IV SOLN
20.0000 mg | Freq: Once | INTRAVENOUS | Status: AC
  Administered 2024-03-22: 20 mg via INTRAVENOUS
  Filled 2024-03-22: qty 50

## 2024-03-22 MED ORDER — SODIUM CHLORIDE 0.9 % IV BOLUS
1000.0000 mL | Freq: Once | INTRAVENOUS | Status: AC
  Administered 2024-03-22: 1000 mL via INTRAVENOUS

## 2024-03-22 NOTE — ED Provider Notes (Signed)
 [place] EMERGENCY DEPARTMENT AT [facility] Provider Note  CSN: [id number] Arrival date & time: 03/22/24 9270  Chief Complaint(s) Oral Swelling  HPI Sandra Mcknight is a 43 y.o. female without relevant past medical history presenting to the emergency department tongue swelling.  Patient reports that woke up this morning with tongue swelling, worse on the left.  Happened to her previously.  She thinks it is triggered by possibly eating marinara sauce.  Reports she gave herself EpiPen  and symptoms have improved.  Denies any shortness of breath, reports some scratchy throat no trouble swallowing.  No nausea or vomiting, abdominal pain, diarrhea.  No lightheadedness or dizziness.  No fainting.  No fevers or chills.   Past Medical History Past Medical History:  Diagnosis Date   Gestational diabetes    Hypertension    Meningitis    Migraine    Obesity (BMI 35.0-39.9 without comorbidity)    Patient Active Problem List   Diagnosis Date Noted   Muscle spasm 03/31/2016   PROM (premature rupture of membranes) 02/19/2016   GBS (group B Streptococcus carrier), +RV culture, currently pregnant 02/12/2016   Migraine 10/04/2015   Gestational diabetes mellitus (GDM) affecting pregnancy, antepartum 09/23/2015   Rubella non-immune status, antepartum 09/19/2015   Obesity 03/31/2011   Home Medication(s) Prior to Admission medications   Medication Sig Start Date End Date Taking? Authorizing Provider  amLODipine  (NORVASC ) 10 MG tablet Take 1 tablet (10 mg total) by mouth daily. 03/21/23   Celestia Rosaline SQUIBB, NP  Aspirin-Salicylamide-Caffeine  (BC HEADACHE POWDER PO) Take by mouth. As needed Patient not taking: Reported on 09/04/2023    [provider]  eletriptan  (RELPAX ) 40 MG tablet Take 1 tablet (40 mg total) by mouth as needed for migraine or headache. May repeat in 2 hours if headache persists or recurs. Patient not taking: Reported on 09/04/2023 07/19/23   Skeet Juliene SAUNDERS, DO   EPINEPHrine  (EPIPEN  2-PAK) 0.3 mg/0.3 mL IJ SOAJ injection Inject 0.3 mg into the muscle as needed for anaphylaxis. 03/22/24   Francesca Elsie LITTIE, MD  hydrochlorothiazide  (HYDRODIURIL ) 12.5 MG tablet Take 1 tablet (12.5 mg total) by mouth daily. 05/15/23   Celestia Rosaline SQUIBB, NP  ibuprofen  (ADVIL ) 600 MG tablet Take 1 tablet (600 mg total) by mouth every 6 (six) hours as needed. 08/04/21   Mortenson, Ashley, MD  metoCLOPramide  (REGLAN ) 10 MG tablet Take 1 tablet (10 mg total) by mouth every 8 (eight) hours as needed for nausea. 05/15/23   Celestia Rosaline SQUIBB, NP  nortriptyline  (PAMELOR ) 25 MG capsule Take 1 capsule (25 mg total) by mouth at bedtime. 07/19/23   Skeet Juliene SAUNDERS, DO  omeprazole  (PRILOSEC) 40 MG capsule Take 1 capsule (40 mg total) by mouth daily. 07/04/23   May, Deanna J, NP  omeprazole  (PRILOSEC) 40 MG capsule Take 1 capsule (40 mg total) by mouth 2 (two) times daily. 07/23/23   Federico Rosario BROCKS, MD  Past Surgical History Past Surgical History:  Procedure Laterality Date   NO PAST SURGERIES     Family History Family History  Problem Relation Age of Onset   Migraines Mother    Diabetes Mother    Migraines Daughter    Migraines Son    Colon cancer Neg Hx    Esophageal cancer Neg Hx    Colon polyps Neg Hx    Rectal cancer Neg Hx    Stomach cancer Neg Hx     Social History Social History   Tobacco Use   Smoking status: Every Day    Current packs/day: 0.50    Average packs/day: 0.5 packs/day for 3.9 years (2.0 ttl pk-yrs)    Types: Cigarettes    Start date: 2022   Smokeless tobacco: Former    Types: Snuff   Tobacco comments:    Did snuff when she was pregnant  Vaping Use   Vaping status: Never Used  Substance Use Topics   Alcohol use: Yes    Comment: socially   Drug use: No   Allergies Patient has no known allergies.  Review of  Systems Review of Systems  All other systems reviewed and are negative.   Physical Exam Vital Signs  I have reviewed the triage vital signs BP 125/68   Pulse 83   Temp 98.6 F (37 C)   Resp 16   SpO2 97%  Physical Exam Vitals and nursing note reviewed.  Constitutional:      General: She is not in acute distress.    Appearance: She is well-developed.  HENT:     Head: Normocephalic and atraumatic.     Mouth/Throat:     Mouth: Mucous membranes are moist.     Comments: No external facial swelling, mild angioedema of tongue no stridor or airway compromise Eyes:     Pupils: Pupils are equal, round, and reactive to light.  Cardiovascular:     Rate and Rhythm: Normal rate and regular rhythm.     Heart sounds: No murmur heard. Pulmonary:     Effort: Pulmonary effort is normal. No respiratory distress.     Breath sounds: Normal breath sounds. No wheezing.  Abdominal:     General: Abdomen is flat.     Palpations: Abdomen is soft.     Tenderness: There is no abdominal tenderness.  Musculoskeletal:        General: No tenderness.     Right lower leg: No edema.     Left lower leg: No edema.  Skin:    General: Skin is warm and dry.     Findings: No rash.  Neurological:     General: No focal deficit present.     Mental Status: She is alert. Mental status is at baseline.  Psychiatric:        Mood and Affect: Mood normal.        Behavior: Behavior normal.     ED Results and Treatments Labs (all labs ordered are listed, but only abnormal results are displayed) Labs Reviewed - No data to display  Radiology No results found.  Pertinent labs & imaging results that were available during my care of the patient were reviewed by me and considered in my medical decision making (see MDM for details).  Medications Ordered in ED Medications  methylPREDNISolone  sodium  succinate (SOLU-MEDROL ) 125 mg/2 mL injection 125 mg (125 mg Intravenous Given 03/22/24 0759)  famotidine  (PEPCID ) IVPB 20 mg premix (0 mg Intravenous Stopped 03/22/24 0834)  diphenhydrAMINE  (BENADRYL ) injection 50 mg (50 mg Intravenous Given 03/22/24 0759)  sodium chloride  0.9 % bolus 1,000 mL (0 mLs Intravenous Stopped 03/22/24 9062)                                                                                                                                     Procedures Procedures  (including critical care time)  Medical Decision Making / ED Course   MDM:  43 year old presents to the emergency department with tongue swelling.  Seems consistent with angioedema or allergic reaction.  Patient has had 2 prior episodes where she has been treated in the emergency department.  She gave herself home EpiPen  and has had improvement in symptoms.  EpiPen  was administered around 7:10 AM.  Will monitor for additional 4 hours after EpiPen .  No indication for airway management at this time.  Will give steroid, Benadryl  and Pepcid .  Patient not on ACE inhibitor or other obvious trigger.  Clinical Course as of 03/22/24 1220  Sat Mar 22, 2024  1219 Patient observed without any recurrence of symptoms.  Feel she is stable for discharge at this time. EpiPen  is refilled.  She has been instructed to follow-up with the allergy center. Will discharge patient to home. All questions answered. Patient comfortable with plan of discharge. Return precautions discussed with patient and specified on the after visit summary.  [WS]    Clinical Course User Index [WS] Francesca Elsie CROME, MD     Additional history obtained:  -External records from outside source obtained and reviewed including: Chart review including previous notes, labs, imaging, consultation notes including prior ER visits for same      Medicines ordered and prescription drug management: Meds ordered this encounter  Medications    methylPREDNISolone  sodium succinate (SOLU-MEDROL ) 125 mg/2 mL injection 125 mg    IV methylprednisolone  will be converted to either a q12h or q24h frequency with the same total daily dose (TDD).  Ordered Dose: 1 to 125 mg TDD; convert to: TDD q24h.  Ordered Dose: 126 to 250 mg TDD; convert to: TDD div q12h.  Ordered Dose: >250 mg TDD; DAW.   famotidine  (PEPCID ) IVPB 20 mg premix   diphenhydrAMINE  (BENADRYL ) injection 50 mg   sodium chloride  0.9 % bolus 1,000 mL   EPINEPHrine  (EPIPEN  2-PAK) 0.3 mg/0.3 mL IJ SOAJ injection    Sig: Inject 0.3 mg into the muscle as needed for anaphylaxis.    Dispense:  1 each    Refill:  0    -  I have reviewed the patients home medicines and have made adjustments as needed  Social Determinants of Health:  Diagnosis or treatment significantly limited by social determinants of health: obesity   Reevaluation: After the interventions noted above, I reevaluated the patient and found that their symptoms have resolved  Co morbidities that complicate the patient evaluation  Past Medical History:  Diagnosis Date   Gestational diabetes    Hypertension    Meningitis    Migraine    Obesity (BMI 35.0-39.9 without comorbidity)       Dispostion: Disposition decision including need for hospitalization was considered, and patient discharged from emergency department.    Final Clinical Impression(s) / ED Diagnoses Final diagnoses:  Angioedema, initial encounter     This chart was dictated using voice recognition software.  Despite best efforts to proofread,  errors can occur which can change the documentation meaning.    Francesca Elsie CROME, MD 03/22/24 1220

## 2024-03-22 NOTE — Discharge Instructions (Addendum)
 We evaluated you for your tongue swelling.  Your symptoms improved while you were observed in the emergency department.  We feel it is safe to go home at this time.  We have refilled your EpiPen .  Please schedule an appointment with the allergy center for further care.  If you have any recurrent swelling or other allergy symptoms like wheezing, shortness of breath, vomiting, dizziness or fainting, or if you need to use your EpiPen  again, please return to the emergency department.

## 2024-03-22 NOTE — ED Triage Notes (Signed)
 Patient reports eating marinara sauce last night before bed Has reactions to marinara Started with hives Now tongue is swelling left side Took epi 7:10am  Difficulty swallowing
